# Patient Record
Sex: Female | Born: 1950 | Race: White | Hispanic: No | Marital: Married | Smoking: Never smoker
Health system: Southern US, Community
[De-identification: ages and names within clinical notes are randomized; demographics above are authoritative.]

## PROBLEM LIST (undated history)

## (undated) HISTORY — PX: BREAST EXCISIONAL BIOPSY: SUR124

---

## 2016-01-10 DIAGNOSIS — M25519 Pain in unspecified shoulder: Secondary | ICD-10-CM | POA: Diagnosis not present

## 2016-01-10 DIAGNOSIS — E049 Nontoxic goiter, unspecified: Secondary | ICD-10-CM | POA: Diagnosis not present

## 2016-01-10 DIAGNOSIS — E7800 Pure hypercholesterolemia, unspecified: Secondary | ICD-10-CM | POA: Diagnosis not present

## 2016-01-10 DIAGNOSIS — F329 Major depressive disorder, single episode, unspecified: Secondary | ICD-10-CM | POA: Diagnosis not present

## 2016-01-10 DIAGNOSIS — Z79899 Other long term (current) drug therapy: Secondary | ICD-10-CM | POA: Diagnosis not present

## 2016-01-15 ENCOUNTER — Ambulatory Visit: Payer: PPO | Attending: Family Medicine | Admitting: Physical Therapy

## 2016-01-15 DIAGNOSIS — M25511 Pain in right shoulder: Secondary | ICD-10-CM | POA: Insufficient documentation

## 2016-01-15 DIAGNOSIS — M25512 Pain in left shoulder: Secondary | ICD-10-CM | POA: Diagnosis not present

## 2016-01-15 DIAGNOSIS — M6281 Muscle weakness (generalized): Secondary | ICD-10-CM | POA: Diagnosis not present

## 2016-01-15 DIAGNOSIS — M2560 Stiffness of unspecified joint, not elsewhere classified: Secondary | ICD-10-CM | POA: Diagnosis not present
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
## 2016-01-15 NOTE — Patient Instructions (Signed)
Lumbar towel roll when sitting.   Smaller towel roll inside pillow case.     Keep doing chin tucks, shoulder blade squeezes.     [facility] [address], [address] [place], [place] [address] Phone # [phone] Fax [phone]

## 2016-01-17 ENCOUNTER — Ambulatory Visit: Payer: PPO | Admitting: Physical Therapy

## 2016-01-17 DIAGNOSIS — M2560 Stiffness of unspecified joint, not elsewhere classified: Secondary | ICD-10-CM

## 2016-01-17 DIAGNOSIS — M25512 Pain in left shoulder: Principal | ICD-10-CM

## 2016-01-17 DIAGNOSIS — M25511 Pain in right shoulder: Secondary | ICD-10-CM

## 2016-01-17 DIAGNOSIS — M6281 Muscle weakness (generalized): Secondary | ICD-10-CM

## 2016-01-17 NOTE — Therapy (Addendum)
[facility] [address], [address] [place], [place], [address] Phone: [phone]   Fax:  [phone]  Physical Therapy Treatment  Patient Details  Name: Priscilla Wilson MRN: [id number] Date of Birth: [birth year] Referring Provider: Sigmund HazelLisa Miller MD  Encounter Date: 01/17/2016      PT End of Session - 01/17/16 1006    Visit Number 2   Number of Visits 10   Date for PT Re-Evaluation 03/11/16   Authorization Type Medicare G codes; KX at 15   PT Start Time 0845   PT Stop Time 0945   PT Time Calculation (min) 60 min   Activity Tolerance Patient tolerated treatment well      No past medical history on file.  No past surgical history on file.  There were no vitals filed for this visit.  Visit Diagnosis:  Pain of both shoulder joints  Joint stiffness  Muscle weakness      Subjective Assessment - 01/17/16 0849    Subjective Sore and achy after the evaluation session.  AM stiffness.  Used the towel in the pillow case and that helped.  Less awakening, less restless.     Currently in Pain? Yes   Pain Score 2    Pain Location Shoulder   Pain Orientation Right;Left   Pain Type Chronic pain   Pain Onset More than a month ago                         [facility] - 01/17/16 0001    Shoulder Exercises: Supine   Other Supine Exercises supine scapular series 5 reps:  flex with narrow and wide grip, HABD, diagonals and ER small range   Shoulder Exercises: Standing   Extension Strengthening;Both;10 reps;Theraband   Theraband Level (Shoulder Extension) Level 1 (Yellow)   Row Strengthening;Both;10 reps;Theraband   Theraband Level (Shoulder Row) Level 1 (Yellow)   Modalities   Modalities Electrical Stimulation;Moist Heat   Moist Heat Therapy   Number Minutes Moist Heat 15 Minutes   Moist Heat Location Shoulder;Cervical  B   Electrical Stimulation   Electrical Stimulation Location B  shoulders   Electrical Stimulation Action Pre-mod   Electrical Stimulation Parameters 8 ma   Electrical Stimulation Goals Pain   Manual Therapy   Manual Therapy Soft tissue mobilization;Scapular mobilization;Manual Traction;Muscle Energy Technique   Soft tissue mobilization upper traps, levator scap, cervical multifidi B   Scapular Mobilization Bilateral med/lat glides   Manual Traction 3x 30 sec   Muscle Energy Technique upper trap R/L 3 x 5 sec       Dry needling to B upper traps, levators and B cervical multifidi with muscle twitch produced and improved muscle length following.   Consent given. Handout on precautions and after care provided.           PT Education - 01/17/16 1005    Education provided Yes   Education Details dry needling after care; standing yellow band rows, extensions   Person(s) Educated Patient   Methods Explanation;Demonstration   Comprehension Verbalized understanding;Returned demonstration          PT Short Term Goals - 01/17/16 1324    PT SHORT TERM GOAL #1   Title The patient will have a good understanding of postural correction for improved sleep and sitting tolerance   02/14/16   Time 4   Period Weeks   Status On-going   PT SHORT TERM GOAL #2   Title The  patient will report improved sleep by 25%   Time 4   Period Weeks   Status On-going   PT SHORT TERM GOAL #3   Title The patient will have improved cervical sidebending to 40 degrees bilaterally and rotation to 50 degrees needed for driving   Time 4   Period Weeks   Status On-going   PT SHORT TERM GOAL #4   Title The patient will have improved shoulder flexion to 170 degrees needed for reaching overhead shelves   Time 4   Period Weeks   Status On-going           PT Long Term Goals - 01/17/16 1325    PT LONG TERM GOAL #1   Title The patient will be independent in safe,self progression of HEP and knowledge of  community resources available for group ex   03/11/16   Time 8   Period  Weeks   Status On-going   PT LONG TERM GOAL #2   Title The patient will report a 50% improvement in sleep   Time 8   Period Weeks   Status On-going   PT LONG TERM GOAL #3   Title Glenohumeral and scapular muscle strength improved to grossly 4/5 needed for taking care of grandchildren and lifting household items    Time 8   Period Weeks   Status On-going   PT LONG TERM GOAL #4   Title Left grip strength improved to 40# of force needed for lifting/carrying items   Time 8   Period Weeks   Status On-going   PT LONG TERM GOAL #5   Title FOTO functional outcome score improved from 43% limitation to 34% indicating improved function with less pain   Time 8   Period Weeks   Status On-going               Plan - 01/17/16 1318    Clinical Impression Statement The patient reports right arm clicking sensation with ROM > 90 degrees, mod discomfort with supine yellow band but better with standing rows and extensions.  Tender points in cervical musculature and upper traps.  Good pain relief with modalities.  Therapist closely monitoring response and monitoring as needed.     PT Next Visit Plan assess response to DN #1;  seated thoracic extension;  ionto if cert signed;  isometrics for shoulder;  estim/heat        Problem List There are no active problems to display for this patient.   Vivien Presto 01/17/2016, 1:27 PM  [facility] [address] [place], [place], [address] Phone: [phone]   Fax:  [phone]  Name: Priscilla Wilson MRN: [id number] Date of Birth: 1950-11-15    Lavinia Sharps, PT 01/17/2016 1:28 PM Phone: [phone] Fax: [phone]

## 2016-01-17 NOTE — Patient Instructions (Addendum)
   [facility] [address], [address] [place], [place] [address] Phone # [phone] Fax [phone]   Trigger Point Dry Needling  . What is Trigger Point Dry Needling (DN)? o DN is a physical therapy technique used to treat muscle pain and dysfunction. Specifically, DN helps deactivate muscle trigger points (muscle knots).  o A thin filiform needle is used to penetrate the skin and stimulate the underlying trigger point. The goal is for a local twitch response (LTR) to occur and for the trigger point to relax. No medication of any kind is injected during the procedure.   . What Does Trigger Point Dry Needling Feel Like?  o The procedure feels different for each individual patient. Some patients report that they do not actually feel the needle enter the skin and overall the process is not painful. Very mild bleeding may occur. However, many patients feel a deep cramping in the muscle in which the needle was inserted. This is the local twitch response.   Marland Kitchen. How Will I feel after the treatment? o Soreness is normal, and the onset of soreness may not occur for a few hours. Typically this soreness does not last longer than two days.  o Bruising is uncommon, however; ice can be used to decrease any possible bruising.  o In rare cases feeling tired or nauseous after the treatment is normal. In addition, your symptoms may get worse before they get better, this period will typically not last longer than 24 hours.   . What Can I do After My Treatment? o Increase your hydration by drinking more water for the next 24 hours. o You may place ice or heat on the areas treated that have become sore, however, do not use heat on inflamed or bruised areas. Heat often brings more relief post needling. o You can continue your regular activities, but vigorous activity is not recommended initially after the treatment for 24 hours. o DN is best combined with other physical therapy such as  strengthening, stretching, and other therapies.

## 2016-01-21 ENCOUNTER — Ambulatory Visit: Payer: PPO | Admitting: Physical Therapy

## 2016-01-21 DIAGNOSIS — M25511 Pain in right shoulder: Secondary | ICD-10-CM | POA: Diagnosis not present

## 2016-01-21 DIAGNOSIS — M2560 Stiffness of unspecified joint, not elsewhere classified: Secondary | ICD-10-CM

## 2016-01-21 DIAGNOSIS — M6281 Muscle weakness (generalized): Secondary | ICD-10-CM

## 2016-01-21 DIAGNOSIS — M25512 Pain in left shoulder: Principal | ICD-10-CM

## 2016-01-21 NOTE — Therapy (Signed)
[facility] [address] [place], [place], [address] Phone: [phone]   Fax:  [phone]  Physical Therapy Treatment  Patient Details  Name: Priscilla Wilson MRN: [id number] Date of Birth: [birth year] Referring Provider: Sigmund HazelLisa Miller MD  Encounter Date: 01/21/2016      PT End of Session - 01/21/16 1553    Visit Number 3   Number of Visits 10   Date for PT Re-Evaluation 03/11/16   Authorization Type Medicare G codes; KX at 15   PT Start Time 1015   PT Stop Time 1115   PT Time Calculation (min) 60 min   Activity Tolerance Patient tolerated treatment well      No past medical history on file.  No past surgical history on file.  There were no vitals filed for this visit.  Visit Diagnosis:  Pain of both shoulder joints  Joint stiffness  Muscle weakness      Subjective Assessment - 01/21/16 1012    Subjective I felt so good after last session.  I felt a world of difference!  Patient demonstrates her new ability to reach behind her back/shoulder blade region.  She attributes relief to dry needling.  Did have difficulty sleeping last night.     Currently in Pain? Yes   Pain Score 3    Pain Location Shoulder   Pain Orientation Right                         [facility] Adult PT Treatment/Exercise - 01/21/16 0001    Neck Exercises: Seated   Other Seated Exercise Seated thoracic    Shoulder Exercises: Isometric Strengthening   Flexion 5X5"   Extension 5X5"   External Rotation 5X5"   Internal Rotation 5X5"   ABduction 5X5"   Moist Heat Therapy   Number Minutes Moist Heat 15 Minutes   Moist Heat Location Shoulder;Cervical  B   Electrical Stimulation   Electrical Stimulation Location B shoulders   Electrical Stimulation Action Pre-mod   Electrical Stimulation Parameters 8-10 ma  supine   Electrical Stimulation Goals Pain   Manual Therapy   Soft tissue mobilization upper traps, levator scap,  cervical multifidi B   Scapular Mobilization Bilateral med/lat glides          Trigger Point Dry Needling - 01/21/16 1552    Consent Given? Yes   Muscles Treated Upper Body Upper trapezius;Levator scapulae;Rhomboids;Subscapularis   Upper Trapezius Response Twitch reponse elicited;Palpable increased muscle length   Levator Scapulae Response Twitch response elicited;Palpable increased muscle length   Rhomboids Response Palpable increased muscle length   Subscapularis Response Palpable increased muscle length     Performed bilaterally         PT Education - 01/21/16 1552    Education provided Yes   Education Details shoulder isometrics   Person(s) Educated Patient   Methods Explanation;Demonstration;Handout   Comprehension Verbalized understanding;Returned demonstration          PT Short Term Goals - 01/21/16 1605    PT SHORT TERM GOAL #1   Title The patient will have a good understanding of postural correction for improved sleep and sitting tolerance   02/14/16   Time 4   Period Weeks   Status On-going   PT SHORT TERM GOAL #2   Title The patient will report improved sleep by 25%   Time 4   Period Weeks   Status On-going   PT SHORT TERM GOAL #3  Title The patient will have improved cervical sidebending to 40 degrees bilaterally and rotation to 50 degrees needed for driving   Period Weeks   Status On-going   PT SHORT TERM GOAL #4   Title The patient will have improved shoulder flexion to 170 degrees needed for reaching overhead shelves   Time 4   Period Weeks   Status On-going           PT Long Term Goals - 01/21/16 1605    PT LONG TERM GOAL #1   Title The patient will be independent in safe,self progression of HEP and knowledge of  community resources available for group ex   03/11/16   Time 8   Period Weeks   Status On-going   PT LONG TERM GOAL #2   Title The patient will report a 50% improvement in sleep   Time 8   Period Weeks   Status On-going   PT  LONG TERM GOAL #3   Title Glenohumeral and scapular muscle strength improved to grossly 4/5 needed for taking care of grandchildren and lifting household items    Time 8   Period Weeks   Status On-going   PT LONG TERM GOAL #4   Title Left grip strength improved to 40# of force needed for lifting/carrying items   Time 8   Period Weeks   Status On-going   PT LONG TERM GOAL #5   Title FOTO functional outcome score improved from 43% limitation to 34% indicating improved function with less pain   Time 8   Period Weeks   Status On-going               Plan - 01/21/16 1554    Clinical Impression Statement The patient reports good pain relief and improved ROM following last treatment session.  She has multiple tender points in interscapular musculature but with improved scapular mobility with manual therapy following dry needling.  Patient with more erect posture with decreased shoulder compensatory shrug today.  Therapist closely monitoring response  to all treatment interventions.     PT Next Visit Plan assess response to DN #2 and continue to cervical, shoulder and interscap muscles;  manual therapy; ionto if cert signed; e-stim/heat; prone scapular ex's        Problem List There are no active problems to display for this patient.  Lavinia Sharps, PT 01/21/2016 4:07 PM Phone: [phone] Fax: [phone] Vivien Presto 01/21/2016, 4:07 PM  [facility] [address] [place], [place], [address] Phone: [phone]   Fax:  [phone]  Name: Priscilla Wilson MRN: [id number] Date of Birth: Jul 11, 1951

## 2016-01-21 NOTE — Patient Instructions (Signed)
Strengthening: Isometric Flexion  Using wall for resistance, press right fist into ball using light pressure. Hold __5__ seconds. Repeat _5___ times per set. Do ____1 sets per session. Do ___2-3_ sessions per day.  SHOULDER: Abduction (Isometric)  Use wall as resistance. Press arm against pillow. Keep elbow straight. Hold _5__ seconds. _5__ reps per set, _1__ sets per day, _7__ days per week  Extension (Isometric)  Place left bent elbow and back of arm against wall. Press elbow against wall. Hold ___5_ seconds. Repeat __5__ times. Do __2-3__ sessions per day.  Internal Rotation (Isometric)  Place palm of right fist against door frame, with elbow bent. Press fist against door frame. Hold __5__ seconds. Repeat ___5_ times. Do __2-3__ sessions per day.  External Rotation (Isometric)  Place back of left fist against door frame, with elbow bent. Press fist against door frame. Hold __5__ seconds. Repeat ___5_ times. Do _2-3___ sessions per day.  Copyright  VHI. All rights reserved.  [facility] [address], [address] [place], [place] [address] Phone # [phone] Fax [phone]

## 2016-01-24 ENCOUNTER — Encounter: Payer: Self-pay | Admitting: Physical Therapy

## 2016-01-24 ENCOUNTER — Ambulatory Visit: Payer: PPO | Admitting: Physical Therapy

## 2016-01-24 DIAGNOSIS — M6281 Muscle weakness (generalized): Secondary | ICD-10-CM

## 2016-01-24 DIAGNOSIS — M25511 Pain in right shoulder: Secondary | ICD-10-CM

## 2016-01-24 DIAGNOSIS — M25512 Pain in left shoulder: Principal | ICD-10-CM

## 2016-01-24 DIAGNOSIS — M2560 Stiffness of unspecified joint, not elsewhere classified: Secondary | ICD-10-CM

## 2016-01-24 NOTE — Therapy (Addendum)
[facility] [address] [place], [place], [address] Phone: [phone]   Fax:  [phone]  Physical Therapy Treatment/Discharge Summary  Patient Details  Name: Priscilla Wilson MRN: [id number] Date of Birth: Jan 14, 1951 Referring Provider: Kathyrn Lass MD  Encounter Date: 01/24/2016      PT End of Session - 01/24/16 1457    Visit Number 4   Number of Visits 10   Date for PT Re-Evaluation 03/11/16   Authorization Type Medicare G codes; KX at 56   PT Start Time 1444   PT Stop Time 1543   PT Time Calculation (min) 59 min   Activity Tolerance Patient tolerated treatment well   Behavior During Therapy [facility] for tasks assessed/performed      History reviewed. No pertinent past medical history.  History reviewed. No pertinent past surgical history.  There were no vitals filed for this visit.  Visit Diagnosis:  Pain of both shoulder joints  Joint stiffness  Muscle weakness      Subjective Assessment - 01/24/16 1455    Subjective Pt confirmed again that Dry Needeling helped a lot. She reports sleeping in the night is better.     Pertinent History Moved here from [place] in November   Limitations Sitting   How long can you sit comfortably? 1 hour   Diagnostic tests x-ray cervical normal   Patient Stated Goals sleep better, get rid of tightness   Currently in Pain? No/denies                         [facility] Adult PT Treatment/Exercise - 01/24/16 0001    Neck Exercises: Seated   Other Seated Exercise Seated thoracic with ball behind back    Shoulder Exercises: Supine   Other Supine Exercises Foam Roll x 3 min with overhead flexion x 10   Shoulder Exercises: Isometric Strengthening   Flexion 5X5"   Extension 5X5"   External Rotation 5X5"   Internal Rotation 5X5"   ABduction 5X5"   Modalities   Modalities Electrical Stimulation;Moist Heat   Moist Heat Therapy   Number Minutes Moist Heat 15  Minutes   Moist Heat Location Shoulder;Cervical  B   Electrical Stimulation   Electrical Stimulation Location B shoulders   Electrical Stimulation Action Pre mod   Electrical Stimulation Parameters to pt's tolerance   Electrical Stimulation Goals Pain   Manual Therapy   Manual Therapy Soft tissue mobilization;Scapular mobilization;Manual Traction;Muscle Energy Technique   Soft tissue mobilization upper traps, levator scap, cervical multifidi B   Scapular Mobilization Bilateral med/lat glides                  PT Short Term Goals - 01/21/16 1605    PT SHORT TERM GOAL #1   Title The patient will have a good understanding of postural correction for improved sleep and sitting tolerance   02/14/16   Time 4   Period Weeks   Status On-going   PT SHORT TERM GOAL #2   Title The patient will report improved sleep by 25%   Time 4   Period Weeks   Status On-going   PT SHORT TERM GOAL #3   Title The patient will have improved cervical sidebending to 40 degrees bilaterally and rotation to 50 degrees needed for driving   Period Weeks   Status On-going   PT SHORT TERM GOAL #4   Title The patient will have improved shoulder flexion to 170 degrees needed  for reaching overhead shelves   Time 4   Period Weeks   Status On-going           PT Long Term Goals - 01/21/16 1605    PT LONG TERM GOAL #1   Title The patient will be independent in safe,self progression of HEP and knowledge of  community resources available for group ex   03/11/16   Time 8   Period Weeks   Status On-going   PT LONG TERM GOAL #2   Title The patient will report a 50% improvement in sleep   Time 8   Period Weeks   Status On-going   PT LONG TERM GOAL #3   Title Glenohumeral and scapular muscle strength improved to grossly 4/5 needed for taking care of grandchildren and lifting household items    Time 8   Period Weeks   Status On-going   PT LONG TERM GOAL #4   Title Left grip strength improved to 40# of  force needed for lifting/carrying items   Time 8   Period Weeks   Status On-going   PT LONG TERM GOAL #5   Title FOTO functional outcome score improved from 43% limitation to 34% indicating improved function with less pain   Time 8   Period Weeks   Status On-going               Plan - 01/24/16 1458    Clinical Impression Statement Pt with improved ROM in bil shoulders. Pt with TP in interscapular musculatur. Pt will continue to benefit from skilled PT to improve ROM, strength endurance.    Pt will benefit from skilled therapeutic intervention in order to improve on the following deficits Decreased range of motion;Decreased strength;Hypomobility;Increased muscle spasms;Impaired UE functional use;Pain;Postural dysfunction   Rehab Potential Good   Clinical Impairments Affecting Rehab Potential osteopenia although last bone density improved; hx of depression   PT Frequency 2x / week   PT Treatment/Interventions ADLs/Self Care Home Management;Cryotherapy;Electrical Stimulation;Ultrasound;Moist Heat;Traction;Iontophoresis '4mg'$ /ml Dexamethasone;Therapeutic activities;Therapeutic exercise;Neuromuscular re-education;Manual techniques;Taping;Dry needling   PT Next Visit Plan Continue with isometrics bil, shoulders, stretching, manaul therapy, see if [place] is signed   Consulted and Agree with Plan of Care Patient        PHYSICAL THERAPY DISCHARGE SUMMARY  Visits from Start of Care: 4  Current functional level related to goals / functional outcomes: The patient called to cancel all her remaining visits, stating she "had business to handle in [place]."  Her chart has been inactive for 6 weeks, will therefore discharge from PT at this time.     Remaining deficits: See above.  No progress toward goals yet.     Education / Equipment: Initial HEP Plan:                                                    Patient goals were not met. Patient is being discharged due to the patient's request.   ?????      G code:  Carrying, moving objects Goal CJ, Current/Discharge CK    Problem List There are no active problems to display for this patient.  Ruben Im, PT 03/10/2016 4:44 PM Phone: [phone] Fax: [phone]  NAUMANN-HOUEGNIFIO,Priscilla Wilson PTA 01/24/2016, 3:15 PM  [facility] [address] [place], [place], [address] Phone: [phone]   Fax:  [phone]  Name: Priscilla Wilson MRN: [id number] Date of Birth: 23-Feb-1951

## 2016-01-29 ENCOUNTER — Encounter: Payer: PPO | Admitting: Physical Therapy

## 2016-01-31 ENCOUNTER — Encounter: Payer: PPO | Admitting: Physical Therapy

## 2016-03-10 ENCOUNTER — Encounter: Payer: Self-pay | Admitting: Physical Therapy

## 2016-03-12 ENCOUNTER — Encounter: Payer: Self-pay | Admitting: Physical Therapy

## 2016-08-07 DIAGNOSIS — Z23 Encounter for immunization: Secondary | ICD-10-CM | POA: Diagnosis not present

## 2016-08-07 DIAGNOSIS — N950 Postmenopausal bleeding: Secondary | ICD-10-CM | POA: Diagnosis not present

## 2016-08-07 DIAGNOSIS — E7800 Pure hypercholesterolemia, unspecified: Secondary | ICD-10-CM | POA: Diagnosis not present

## 2016-08-07 DIAGNOSIS — R1011 Right upper quadrant pain: Secondary | ICD-10-CM | POA: Diagnosis not present

## 2016-08-08 ENCOUNTER — Other Ambulatory Visit: Payer: Self-pay | Admitting: Family Medicine

## 2016-08-08 DIAGNOSIS — R1011 Right upper quadrant pain: Secondary | ICD-10-CM

## 2016-08-26 ENCOUNTER — Other Ambulatory Visit: Payer: PPO

## 2016-09-04 ENCOUNTER — Ambulatory Visit
Admission: RE | Admit: 2016-09-04 | Discharge: 2016-09-04 | Disposition: A | Discharge status: Home / Self Care | Payer: PPO | Source: Ambulatory Visit | Attending: Family Medicine | Admitting: Family Medicine

## 2016-09-04 ENCOUNTER — Other Ambulatory Visit: Payer: Self-pay | Admitting: Family Medicine

## 2016-09-04 DIAGNOSIS — R1011 Right upper quadrant pain: Secondary | ICD-10-CM | POA: Diagnosis not present

## 2016-09-04 DIAGNOSIS — N950 Postmenopausal bleeding: Secondary | ICD-10-CM

## 2016-09-29 DIAGNOSIS — R05 Cough: Secondary | ICD-10-CM | POA: Diagnosis not present

## 2017-02-16 DIAGNOSIS — E7800 Pure hypercholesterolemia, unspecified: Secondary | ICD-10-CM | POA: Diagnosis not present

## 2017-02-16 DIAGNOSIS — Z79899 Other long term (current) drug therapy: Secondary | ICD-10-CM | POA: Diagnosis not present

## 2017-02-16 DIAGNOSIS — M7989 Other specified soft tissue disorders: Secondary | ICD-10-CM | POA: Diagnosis not present

## 2017-02-16 DIAGNOSIS — Z1159 Encounter for screening for other viral diseases: Secondary | ICD-10-CM | POA: Diagnosis not present

## 2017-02-16 DIAGNOSIS — F329 Major depressive disorder, single episode, unspecified: Secondary | ICD-10-CM | POA: Diagnosis not present

## 2017-03-24 DIAGNOSIS — M2550 Pain in unspecified joint: Secondary | ICD-10-CM | POA: Diagnosis not present

## 2017-03-24 DIAGNOSIS — E663 Overweight: Secondary | ICD-10-CM | POA: Diagnosis not present

## 2017-03-24 DIAGNOSIS — Z1589 Genetic susceptibility to other disease: Secondary | ICD-10-CM | POA: Diagnosis not present

## 2017-03-24 DIAGNOSIS — M150 Primary generalized (osteo)arthritis: Secondary | ICD-10-CM | POA: Diagnosis not present

## 2017-03-24 DIAGNOSIS — R768 Other specified abnormal immunological findings in serum: Secondary | ICD-10-CM | POA: Diagnosis not present

## 2017-03-24 DIAGNOSIS — M064 Inflammatory polyarthropathy: Secondary | ICD-10-CM | POA: Diagnosis not present

## 2017-03-24 DIAGNOSIS — Z6827 Body mass index (BMI) 27.0-27.9, adult: Secondary | ICD-10-CM | POA: Diagnosis not present

## 2017-04-02 IMAGING — US US PELVIS COMPLETE
1 series · 14 of 25 positions shown · non-contrast
Comparison: None

CLINICAL DATA: Postmenopausal bleeding on off for 4 months. No
pelvic pain.

EXAM:
TRANSABDOMINAL AND TRANSVAGINAL ULTRASOUND OF PELVIS
TECHNIQUE: Both transabdominal and transvaginal ultrasound examinations of the
pelvis were performed. Transabdominal technique was performed for
global imaging of the pelvis including uterus, ovaries, adnexal
regions, and pelvic cul-de-sac. It was necessary to proceed with
endovaginal exam following the transabdominal exam to visualize the
endometrium and ovaries to better advantage.

[Series 1: us pelvis complete · 0.30mm/px · 14 of 51 slices shown]
[im 1/51]
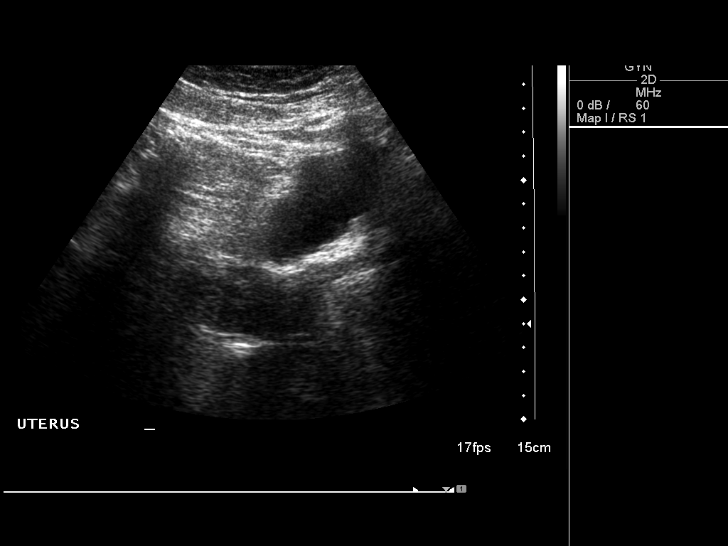
[im 5/51]
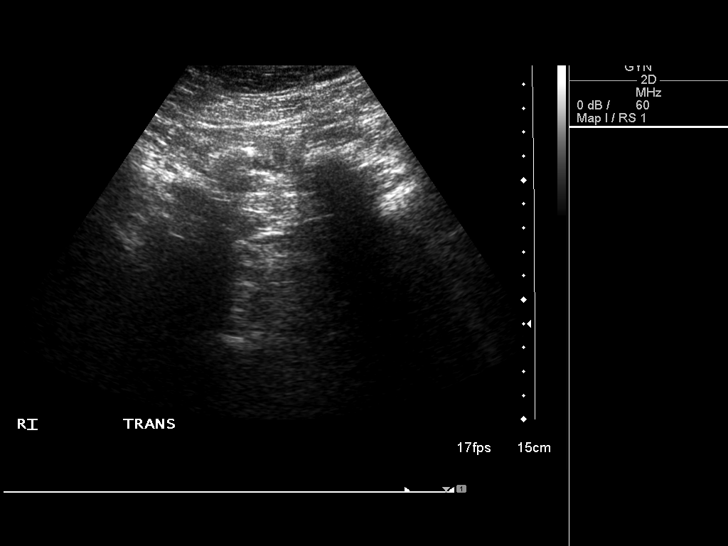
[im 9/51]
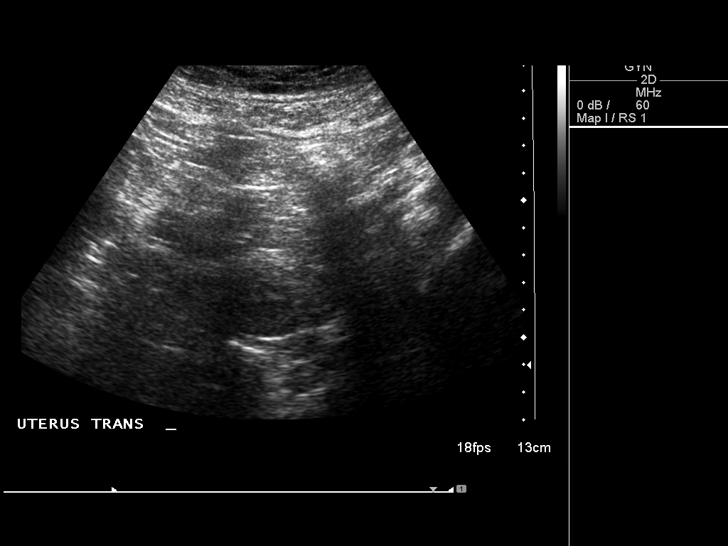
[im 13/51]
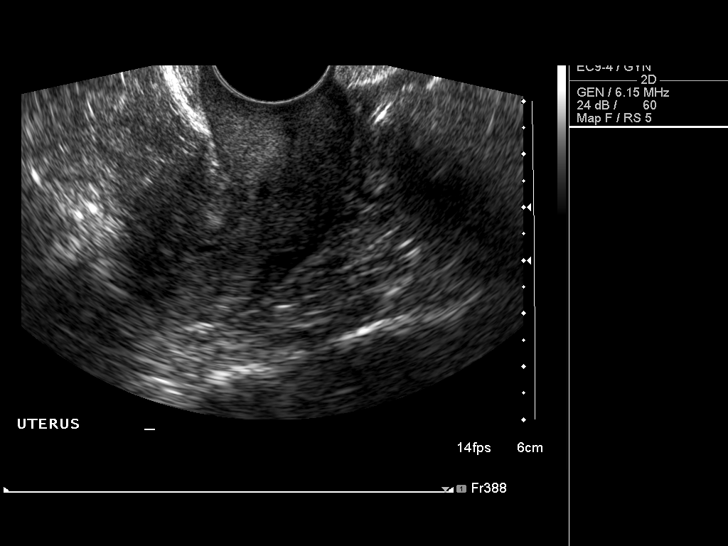
[im 17/51]
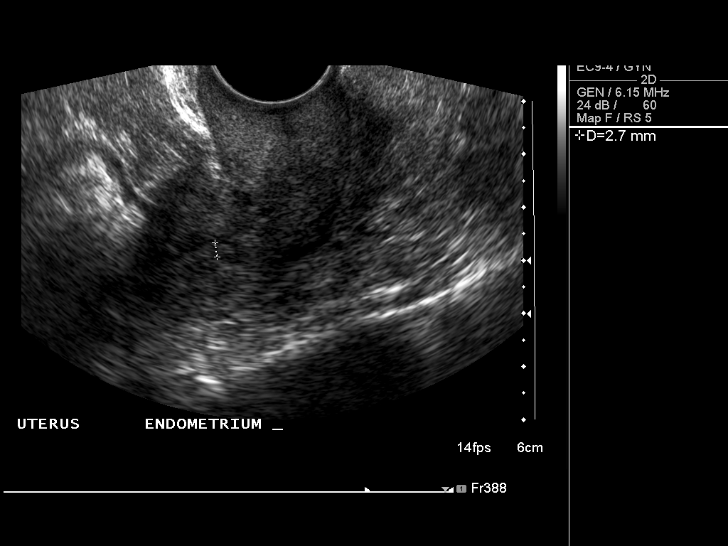
[im 19/51]
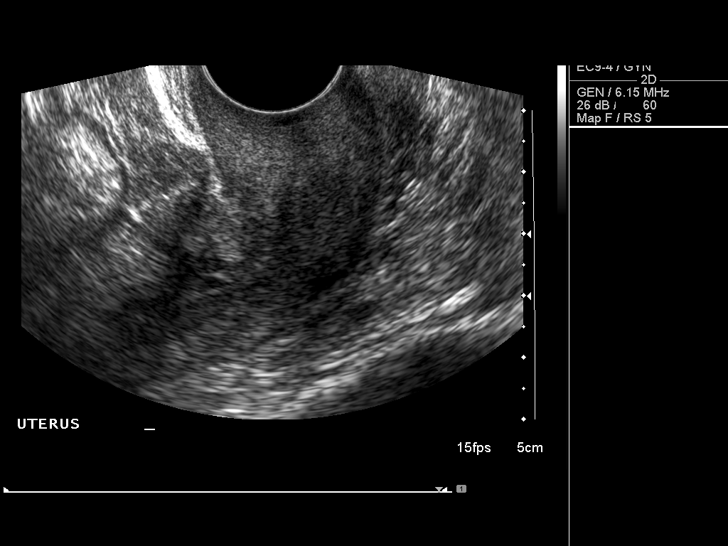
[im 23/51]
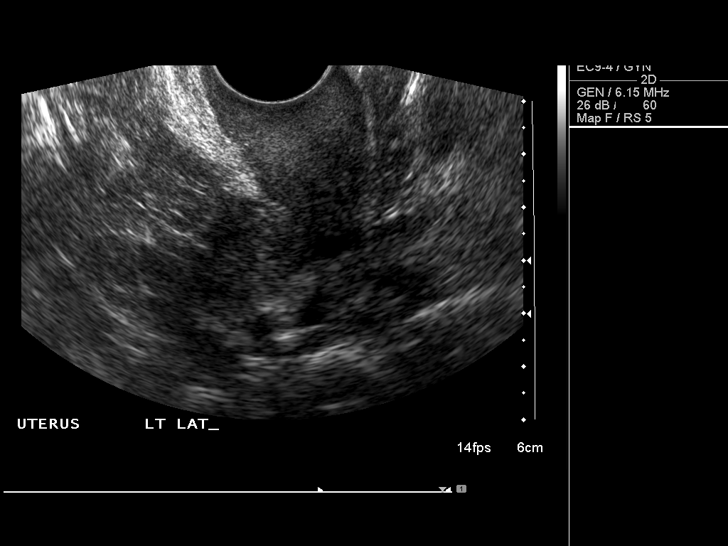
[im 28/51]
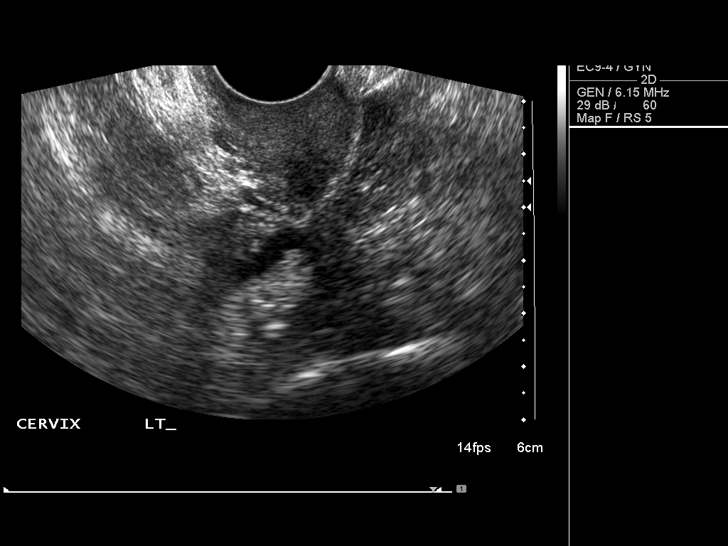
[im 32/51]
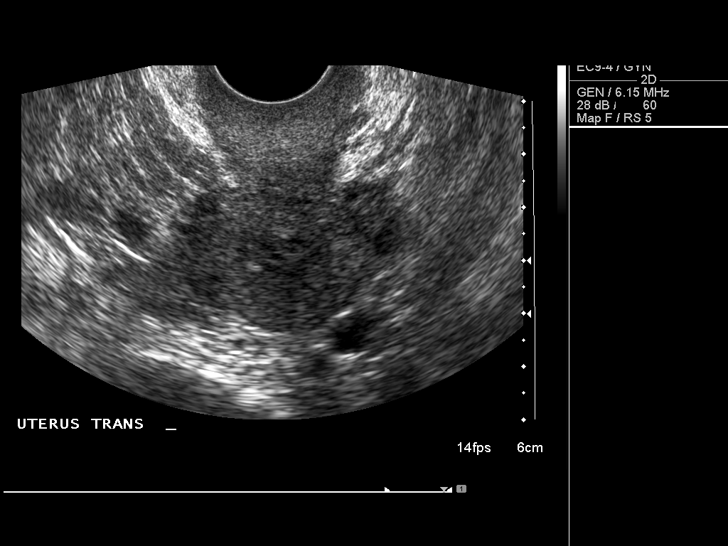
[im 34/51]
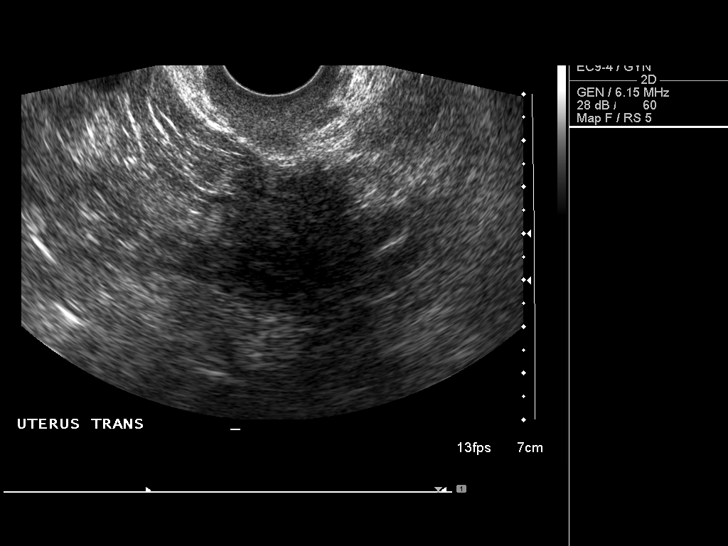
[im 38/51]
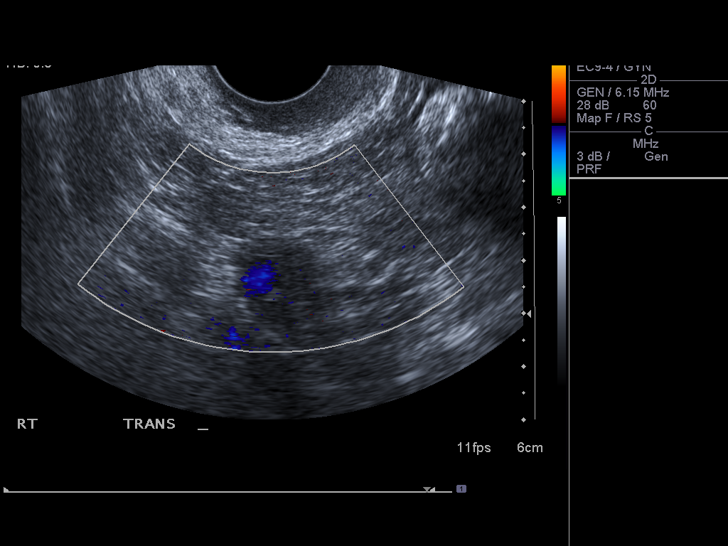
[im 42/51]
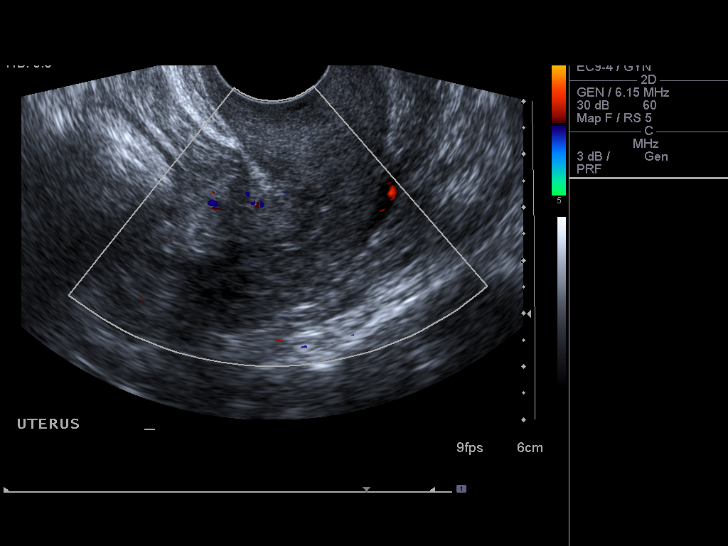
[im 46/51]
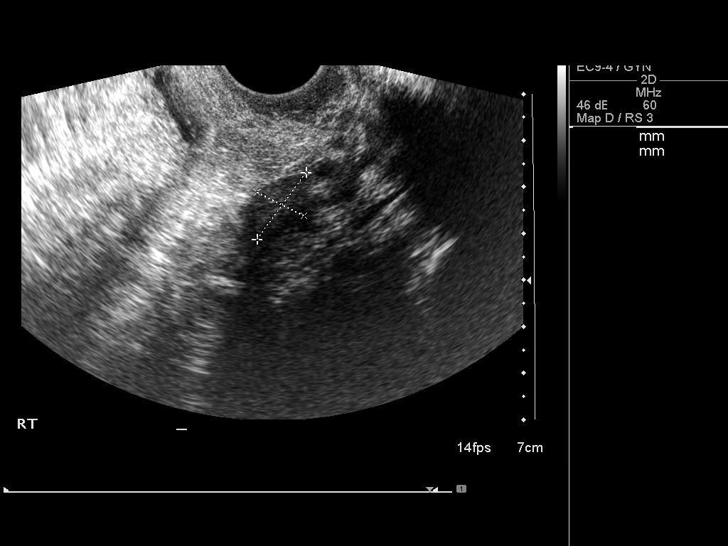
[im 51/51]
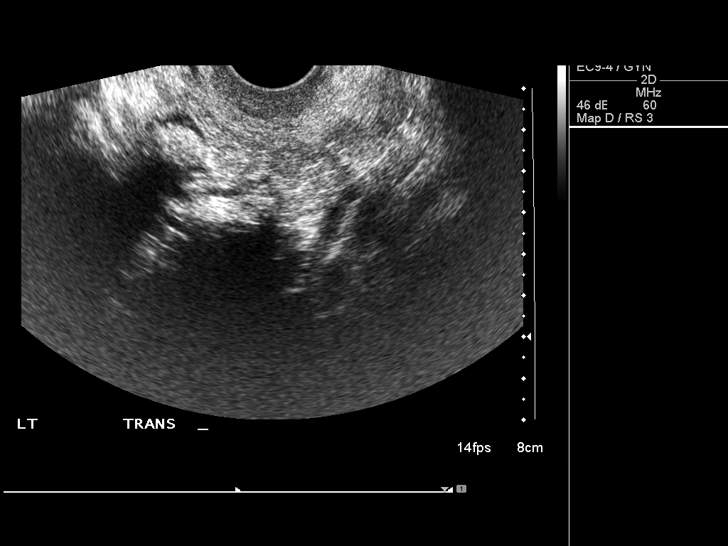

[14 of 25 positions shown; findings below may reference images not displayed]

FINDINGS: Uterus

Measurements: 6.2 x 3.2 x 4.0 cm. No fibroids or other mass
visualized.

Endometrium

Thickness: 2.7 mm. Sliver of fluid in the lower endometrial canal.
No endometrial masses.

Right ovary

Measurements: 18 x 11 x 11 mm. Not well visualized. No visualized
ovarian abnormality. No right adnexal masses.

Left ovary

Not visualized.  No left adnexal masses.

Other findings

No abnormal free fluid.
IMPRESSION: 1. No acute findings.
2. No endometrial thickening or mass. No findings to account for
postmenopausal bleeding.
3. Left ovary not visualized. No adnexal masses. No pelvic free
fluid.

## 2017-04-15 DIAGNOSIS — Z1589 Genetic susceptibility to other disease: Secondary | ICD-10-CM | POA: Diagnosis not present

## 2017-04-15 DIAGNOSIS — R768 Other specified abnormal immunological findings in serum: Secondary | ICD-10-CM | POA: Diagnosis not present

## 2017-04-15 DIAGNOSIS — Z6827 Body mass index (BMI) 27.0-27.9, adult: Secondary | ICD-10-CM | POA: Diagnosis not present

## 2017-04-15 DIAGNOSIS — M064 Inflammatory polyarthropathy: Secondary | ICD-10-CM | POA: Diagnosis not present

## 2017-04-15 DIAGNOSIS — M150 Primary generalized (osteo)arthritis: Secondary | ICD-10-CM | POA: Diagnosis not present

## 2017-04-15 DIAGNOSIS — E663 Overweight: Secondary | ICD-10-CM | POA: Diagnosis not present

## 2017-04-15 DIAGNOSIS — M2550 Pain in unspecified joint: Secondary | ICD-10-CM | POA: Diagnosis not present

## 2017-08-11 DIAGNOSIS — Z1589 Genetic susceptibility to other disease: Secondary | ICD-10-CM | POA: Diagnosis not present

## 2017-08-11 DIAGNOSIS — M064 Inflammatory polyarthropathy: Secondary | ICD-10-CM | POA: Diagnosis not present

## 2017-08-11 DIAGNOSIS — M150 Primary generalized (osteo)arthritis: Secondary | ICD-10-CM | POA: Diagnosis not present

## 2017-08-11 DIAGNOSIS — R21 Rash and other nonspecific skin eruption: Secondary | ICD-10-CM | POA: Diagnosis not present

## 2017-08-11 DIAGNOSIS — Z6827 Body mass index (BMI) 27.0-27.9, adult: Secondary | ICD-10-CM | POA: Diagnosis not present

## 2017-08-11 DIAGNOSIS — E663 Overweight: Secondary | ICD-10-CM | POA: Diagnosis not present

## 2017-08-11 DIAGNOSIS — M2550 Pain in unspecified joint: Secondary | ICD-10-CM | POA: Diagnosis not present

## 2017-08-11 DIAGNOSIS — R768 Other specified abnormal immunological findings in serum: Secondary | ICD-10-CM | POA: Diagnosis not present

## 2017-11-16 DIAGNOSIS — Z6827 Body mass index (BMI) 27.0-27.9, adult: Secondary | ICD-10-CM | POA: Diagnosis not present

## 2017-11-16 DIAGNOSIS — M150 Primary generalized (osteo)arthritis: Secondary | ICD-10-CM | POA: Diagnosis not present

## 2017-11-16 DIAGNOSIS — M064 Inflammatory polyarthropathy: Secondary | ICD-10-CM | POA: Diagnosis not present

## 2017-11-16 DIAGNOSIS — R768 Other specified abnormal immunological findings in serum: Secondary | ICD-10-CM | POA: Diagnosis not present

## 2017-11-16 DIAGNOSIS — E663 Overweight: Secondary | ICD-10-CM | POA: Diagnosis not present

## 2017-11-16 DIAGNOSIS — M2550 Pain in unspecified joint: Secondary | ICD-10-CM | POA: Diagnosis not present

## 2017-11-16 DIAGNOSIS — Z1589 Genetic susceptibility to other disease: Secondary | ICD-10-CM | POA: Diagnosis not present

## 2017-11-30 DIAGNOSIS — R05 Cough: Secondary | ICD-10-CM | POA: Diagnosis not present

## 2017-11-30 DIAGNOSIS — J0100 Acute maxillary sinusitis, unspecified: Secondary | ICD-10-CM | POA: Diagnosis not present

## 2017-11-30 DIAGNOSIS — J0110 Acute frontal sinusitis, unspecified: Secondary | ICD-10-CM | POA: Diagnosis not present

## 2017-12-21 DIAGNOSIS — R200 Anesthesia of skin: Secondary | ICD-10-CM | POA: Diagnosis not present

## 2017-12-21 DIAGNOSIS — M79642 Pain in left hand: Secondary | ICD-10-CM | POA: Diagnosis not present

## 2017-12-21 DIAGNOSIS — R52 Pain, unspecified: Secondary | ICD-10-CM | POA: Diagnosis not present

## 2017-12-21 DIAGNOSIS — M65331 Trigger finger, right middle finger: Secondary | ICD-10-CM | POA: Diagnosis not present

## 2017-12-21 DIAGNOSIS — M542 Cervicalgia: Secondary | ICD-10-CM | POA: Diagnosis not present

## 2017-12-22 DIAGNOSIS — G8929 Other chronic pain: Secondary | ICD-10-CM | POA: Diagnosis not present

## 2017-12-22 DIAGNOSIS — M5412 Radiculopathy, cervical region: Secondary | ICD-10-CM | POA: Diagnosis not present

## 2017-12-22 DIAGNOSIS — G5602 Carpal tunnel syndrome, left upper limb: Secondary | ICD-10-CM | POA: Diagnosis not present

## 2017-12-23 DIAGNOSIS — M65331 Trigger finger, right middle finger: Secondary | ICD-10-CM | POA: Diagnosis not present

## 2017-12-23 DIAGNOSIS — G5602 Carpal tunnel syndrome, left upper limb: Secondary | ICD-10-CM | POA: Diagnosis not present

## 2018-02-03 DIAGNOSIS — G5602 Carpal tunnel syndrome, left upper limb: Secondary | ICD-10-CM | POA: Diagnosis not present

## 2018-02-17 DIAGNOSIS — E7800 Pure hypercholesterolemia, unspecified: Secondary | ICD-10-CM | POA: Diagnosis not present

## 2018-02-17 DIAGNOSIS — F325 Major depressive disorder, single episode, in full remission: Secondary | ICD-10-CM | POA: Diagnosis not present

## 2018-02-17 DIAGNOSIS — M150 Primary generalized (osteo)arthritis: Secondary | ICD-10-CM | POA: Diagnosis not present

## 2018-02-17 DIAGNOSIS — E663 Overweight: Secondary | ICD-10-CM | POA: Diagnosis not present

## 2018-02-17 DIAGNOSIS — Z23 Encounter for immunization: Secondary | ICD-10-CM | POA: Diagnosis not present

## 2018-05-10 DIAGNOSIS — Z6828 Body mass index (BMI) 28.0-28.9, adult: Secondary | ICD-10-CM | POA: Diagnosis not present

## 2018-05-10 DIAGNOSIS — M150 Primary generalized (osteo)arthritis: Secondary | ICD-10-CM | POA: Diagnosis not present

## 2018-05-10 DIAGNOSIS — M064 Inflammatory polyarthropathy: Secondary | ICD-10-CM | POA: Diagnosis not present

## 2018-05-10 DIAGNOSIS — E663 Overweight: Secondary | ICD-10-CM | POA: Diagnosis not present

## 2018-05-10 DIAGNOSIS — M2550 Pain in unspecified joint: Secondary | ICD-10-CM | POA: Diagnosis not present

## 2018-05-10 DIAGNOSIS — R768 Other specified abnormal immunological findings in serum: Secondary | ICD-10-CM | POA: Diagnosis not present

## 2018-05-10 DIAGNOSIS — Z1589 Genetic susceptibility to other disease: Secondary | ICD-10-CM | POA: Diagnosis not present

## 2018-05-17 DIAGNOSIS — R768 Other specified abnormal immunological findings in serum: Secondary | ICD-10-CM | POA: Diagnosis not present

## 2018-05-17 DIAGNOSIS — M064 Inflammatory polyarthropathy: Secondary | ICD-10-CM | POA: Diagnosis not present

## 2018-06-02 DIAGNOSIS — M859 Disorder of bone density and structure, unspecified: Secondary | ICD-10-CM | POA: Diagnosis not present

## 2018-06-02 DIAGNOSIS — E7800 Pure hypercholesterolemia, unspecified: Secondary | ICD-10-CM | POA: Diagnosis not present

## 2018-06-02 DIAGNOSIS — F325 Major depressive disorder, single episode, in full remission: Secondary | ICD-10-CM | POA: Diagnosis not present

## 2018-06-02 DIAGNOSIS — Z6828 Body mass index (BMI) 28.0-28.9, adult: Secondary | ICD-10-CM | POA: Diagnosis not present

## 2018-06-02 DIAGNOSIS — Z0000 Encounter for general adult medical examination without abnormal findings: Secondary | ICD-10-CM | POA: Diagnosis not present

## 2018-06-07 ENCOUNTER — Other Ambulatory Visit: Payer: Self-pay | Admitting: Family Medicine

## 2018-06-07 DIAGNOSIS — Z1231 Encounter for screening mammogram for malignant neoplasm of breast: Secondary | ICD-10-CM

## 2018-06-23 DIAGNOSIS — R05 Cough: Secondary | ICD-10-CM | POA: Diagnosis not present

## 2018-06-29 ENCOUNTER — Ambulatory Visit
Admission: RE | Admit: 2018-06-29 | Discharge: 2018-06-29 | Disposition: A | Discharge status: Home / Self Care | Payer: PPO | Source: Ambulatory Visit | Attending: Family Medicine | Admitting: Family Medicine

## 2018-06-29 ENCOUNTER — Encounter: Payer: Self-pay | Admitting: Radiology

## 2018-06-29 DIAGNOSIS — Z1231 Encounter for screening mammogram for malignant neoplasm of breast: Secondary | ICD-10-CM

## 2018-07-01 DIAGNOSIS — J45909 Unspecified asthma, uncomplicated: Secondary | ICD-10-CM | POA: Diagnosis not present

## 2018-08-10 DIAGNOSIS — R768 Other specified abnormal immunological findings in serum: Secondary | ICD-10-CM | POA: Diagnosis not present

## 2018-08-10 DIAGNOSIS — M150 Primary generalized (osteo)arthritis: Secondary | ICD-10-CM | POA: Diagnosis not present

## 2018-08-10 DIAGNOSIS — Z6827 Body mass index (BMI) 27.0-27.9, adult: Secondary | ICD-10-CM | POA: Diagnosis not present

## 2018-08-10 DIAGNOSIS — Z1589 Genetic susceptibility to other disease: Secondary | ICD-10-CM | POA: Diagnosis not present

## 2018-08-10 DIAGNOSIS — M064 Inflammatory polyarthropathy: Secondary | ICD-10-CM | POA: Diagnosis not present

## 2018-08-10 DIAGNOSIS — E663 Overweight: Secondary | ICD-10-CM | POA: Diagnosis not present

## 2018-08-10 DIAGNOSIS — M2550 Pain in unspecified joint: Secondary | ICD-10-CM | POA: Diagnosis not present

## 2018-09-16 DIAGNOSIS — R05 Cough: Secondary | ICD-10-CM | POA: Diagnosis not present

## 2018-09-16 DIAGNOSIS — J0100 Acute maxillary sinusitis, unspecified: Secondary | ICD-10-CM | POA: Diagnosis not present

## 2018-09-16 DIAGNOSIS — J069 Acute upper respiratory infection, unspecified: Secondary | ICD-10-CM | POA: Diagnosis not present

## 2018-10-26 DIAGNOSIS — H5010 Unspecified exotropia: Secondary | ICD-10-CM | POA: Diagnosis not present

## 2018-11-11 DIAGNOSIS — M2550 Pain in unspecified joint: Secondary | ICD-10-CM | POA: Diagnosis not present

## 2018-11-11 DIAGNOSIS — M064 Inflammatory polyarthropathy: Secondary | ICD-10-CM | POA: Diagnosis not present

## 2018-11-11 DIAGNOSIS — E663 Overweight: Secondary | ICD-10-CM | POA: Diagnosis not present

## 2018-11-11 DIAGNOSIS — Z1589 Genetic susceptibility to other disease: Secondary | ICD-10-CM | POA: Diagnosis not present

## 2018-11-11 DIAGNOSIS — R768 Other specified abnormal immunological findings in serum: Secondary | ICD-10-CM | POA: Diagnosis not present

## 2018-11-11 DIAGNOSIS — M150 Primary generalized (osteo)arthritis: Secondary | ICD-10-CM | POA: Diagnosis not present

## 2018-11-11 DIAGNOSIS — Z6827 Body mass index (BMI) 27.0-27.9, adult: Secondary | ICD-10-CM | POA: Diagnosis not present

## 2018-12-03 DIAGNOSIS — J4520 Mild intermittent asthma, uncomplicated: Secondary | ICD-10-CM | POA: Diagnosis not present

## 2018-12-03 DIAGNOSIS — J0101 Acute recurrent maxillary sinusitis: Secondary | ICD-10-CM | POA: Diagnosis not present

## 2018-12-06 ENCOUNTER — Other Ambulatory Visit: Payer: Self-pay | Admitting: Family Medicine

## 2018-12-06 DIAGNOSIS — J0101 Acute recurrent maxillary sinusitis: Secondary | ICD-10-CM

## 2018-12-09 ENCOUNTER — Ambulatory Visit
Admission: RE | Admit: 2018-12-09 | Discharge: 2018-12-09 | Disposition: A | Discharge status: Home / Self Care | Payer: PPO | Source: Ambulatory Visit | Attending: Family Medicine | Admitting: Family Medicine

## 2018-12-09 DIAGNOSIS — J0101 Acute recurrent maxillary sinusitis: Secondary | ICD-10-CM

## 2018-12-09 DIAGNOSIS — J0120 Acute ethmoidal sinusitis, unspecified: Secondary | ICD-10-CM | POA: Diagnosis not present

## 2019-01-25 IMAGING — MG DIGITAL SCREENING BILATERAL MAMMOGRAM WITH TOMO AND CAD
8 series · 8 of 24 positions shown · non-contrast
Comparison: None.

ACR Breast Density Category a: The breast tissue is almost entirely
fatty.

CLINICAL DATA: Screening.

EXAM:
DIGITAL SCREENING BILATERAL MAMMOGRAM WITH TOMO AND CAD

[L CC synth-2D]
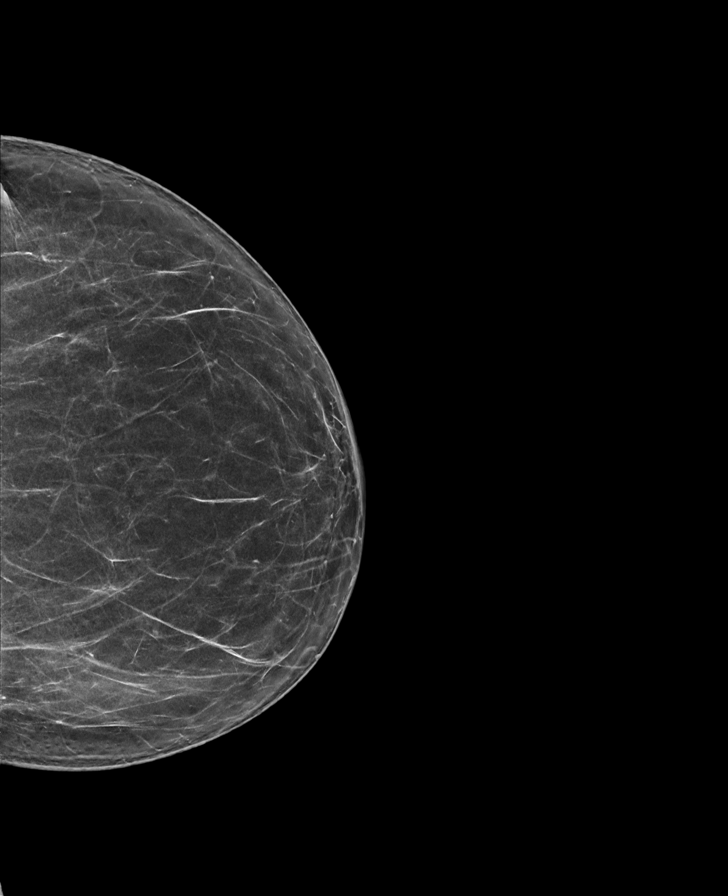

[R CC synth-2D]
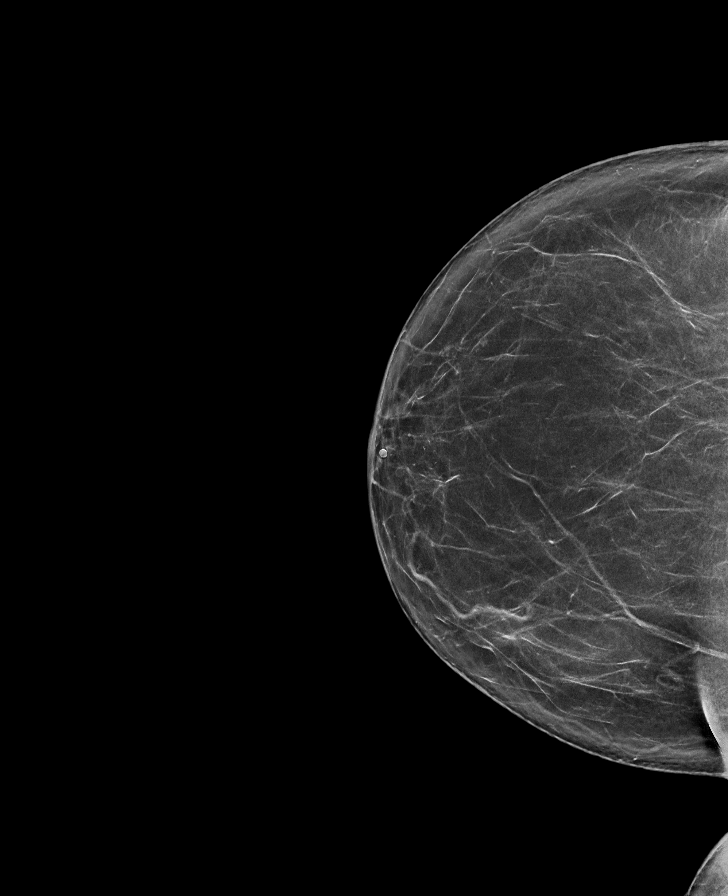

[L MLO synth-2D]
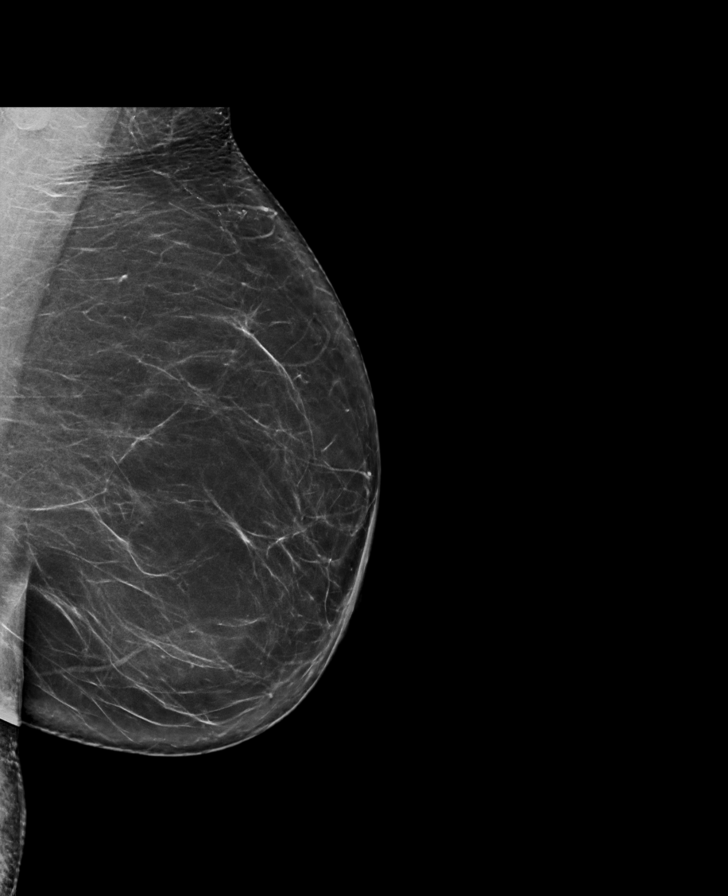

[R MLO synth-2D]
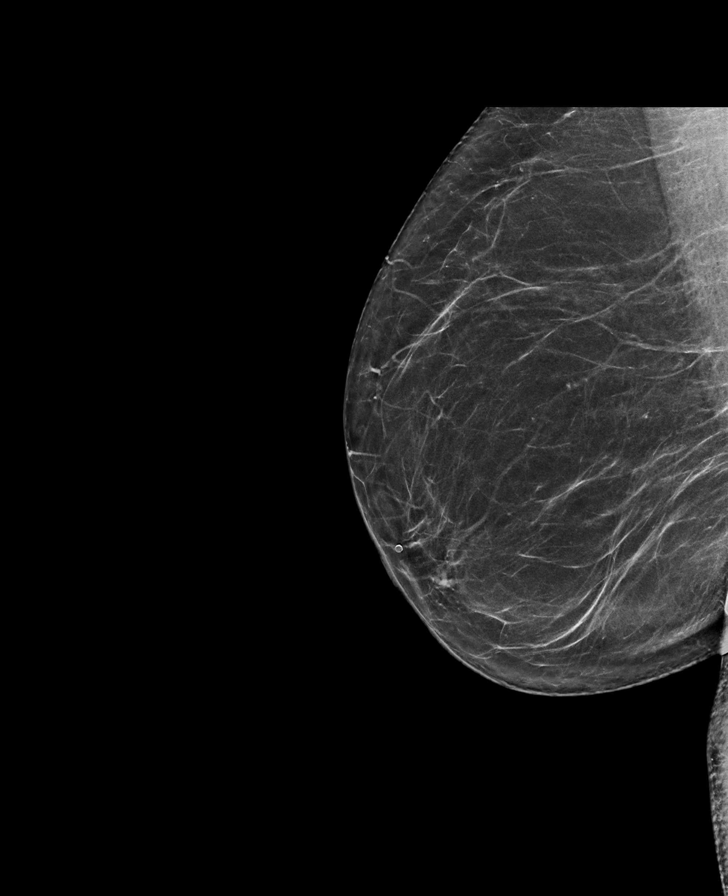

[R MLO tomo · tomo slice 37/73.0]
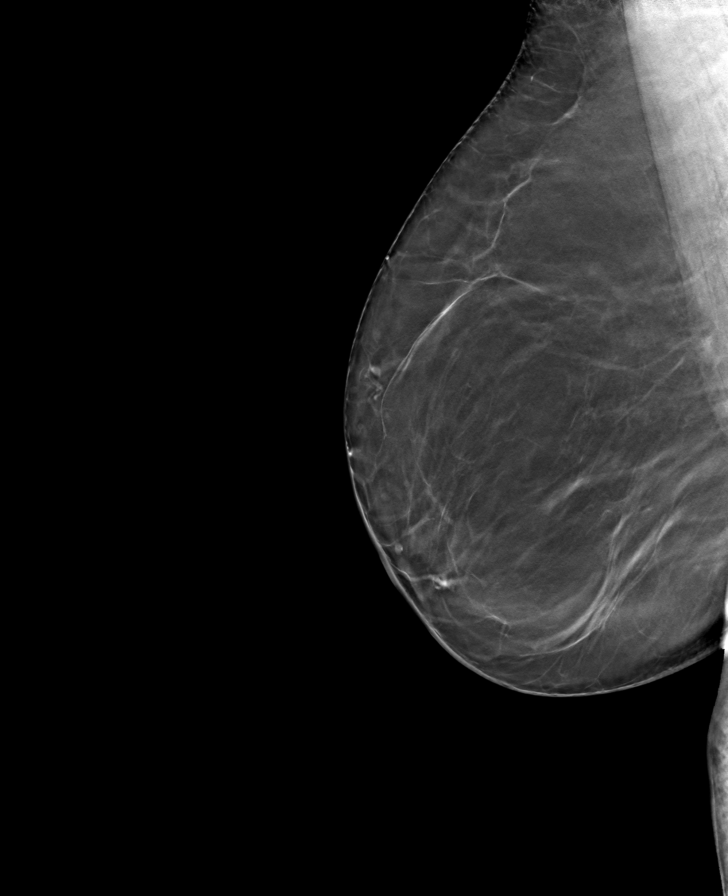

[L CC tomo · tomo slice 35/70.0]
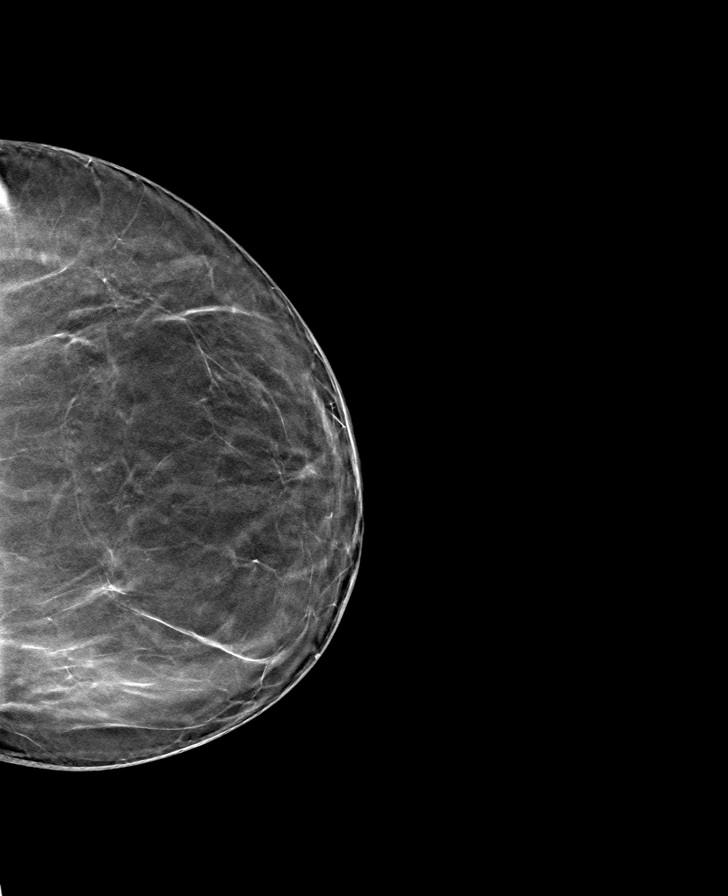

[R CC tomo · tomo slice 36/71.0]
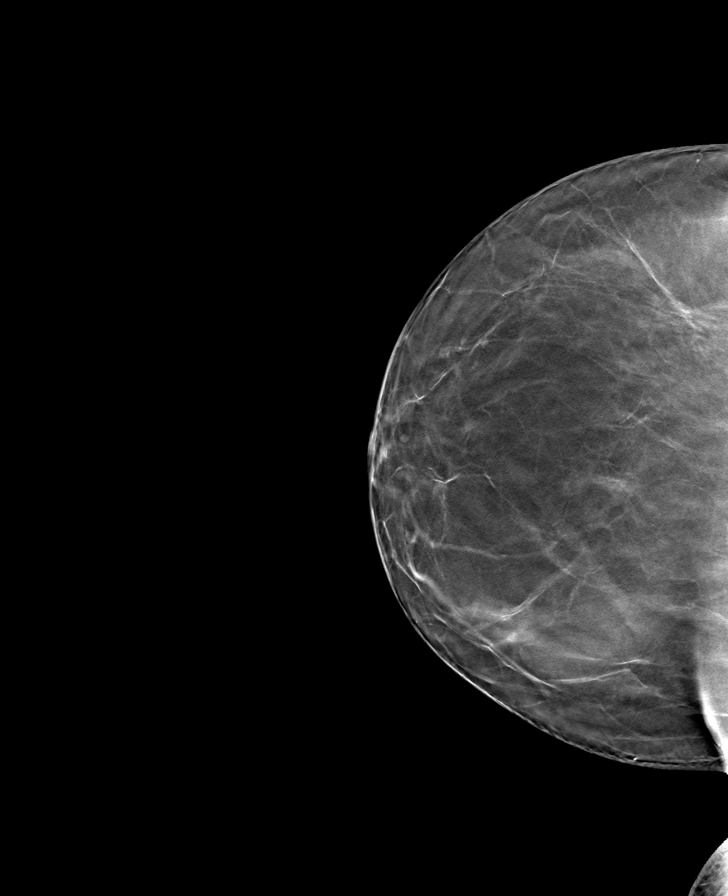

[L MLO tomo · tomo slice 41/82.0]
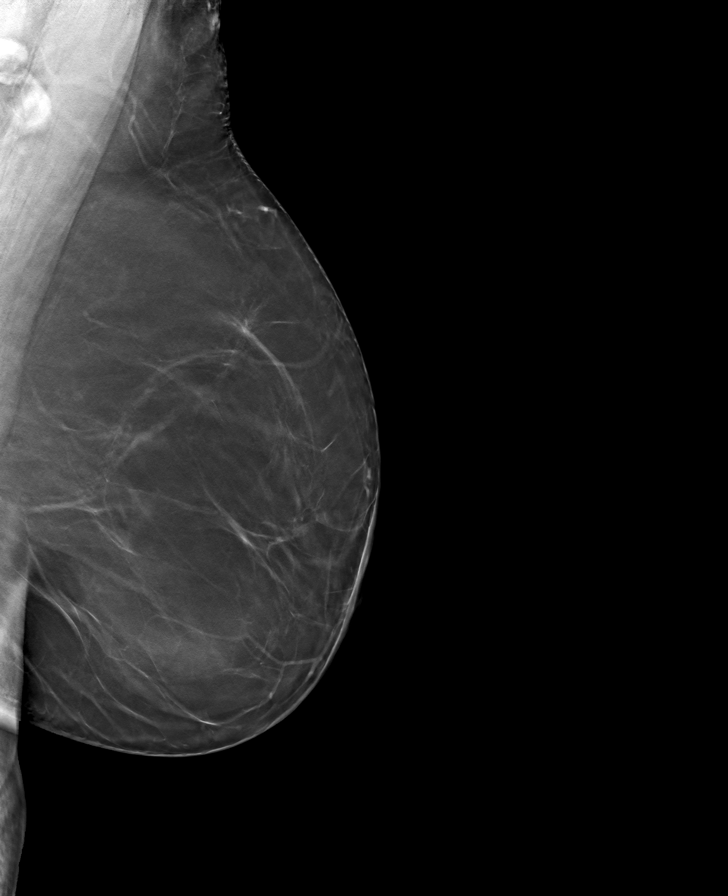

[8 of 24 positions shown; findings below may reference images not displayed]

FINDINGS: There are no findings suspicious for malignancy. Images were
processed with CAD.
IMPRESSION: No mammographic evidence of malignancy. A result letter of this
screening mammogram will be mailed directly to the patient.

RECOMMENDATION:
Screening mammogram in one year. (Code:[id number])

BI-RADS CATEGORY  1: Negative.

## 2019-03-21 DIAGNOSIS — J4520 Mild intermittent asthma, uncomplicated: Secondary | ICD-10-CM | POA: Diagnosis not present

## 2019-06-20 DIAGNOSIS — E049 Nontoxic goiter, unspecified: Secondary | ICD-10-CM | POA: Diagnosis not present

## 2019-06-20 DIAGNOSIS — J4520 Mild intermittent asthma, uncomplicated: Secondary | ICD-10-CM | POA: Diagnosis not present

## 2019-06-20 DIAGNOSIS — Z0000 Encounter for general adult medical examination without abnormal findings: Secondary | ICD-10-CM | POA: Diagnosis not present

## 2019-06-20 DIAGNOSIS — E7800 Pure hypercholesterolemia, unspecified: Secondary | ICD-10-CM | POA: Diagnosis not present

## 2019-06-20 DIAGNOSIS — F325 Major depressive disorder, single episode, in full remission: Secondary | ICD-10-CM | POA: Diagnosis not present

## 2020-03-21 DIAGNOSIS — R768 Other specified abnormal immunological findings in serum: Secondary | ICD-10-CM | POA: Diagnosis not present

## 2020-03-21 DIAGNOSIS — M064 Inflammatory polyarthropathy: Secondary | ICD-10-CM | POA: Diagnosis not present

## 2020-03-21 DIAGNOSIS — Z6826 Body mass index (BMI) 26.0-26.9, adult: Secondary | ICD-10-CM | POA: Diagnosis not present

## 2020-03-21 DIAGNOSIS — E663 Overweight: Secondary | ICD-10-CM | POA: Diagnosis not present

## 2020-03-21 DIAGNOSIS — M150 Primary generalized (osteo)arthritis: Secondary | ICD-10-CM | POA: Diagnosis not present

## 2020-03-21 DIAGNOSIS — Z1589 Genetic susceptibility to other disease: Secondary | ICD-10-CM | POA: Diagnosis not present

## 2020-04-11 DIAGNOSIS — M7580 Other shoulder lesions, unspecified shoulder: Secondary | ICD-10-CM | POA: Diagnosis not present

## 2020-04-20 DIAGNOSIS — M064 Inflammatory polyarthropathy: Secondary | ICD-10-CM | POA: Diagnosis not present

## 2020-04-20 DIAGNOSIS — R768 Other specified abnormal immunological findings in serum: Secondary | ICD-10-CM | POA: Diagnosis not present

## 2020-04-20 DIAGNOSIS — M150 Primary generalized (osteo)arthritis: Secondary | ICD-10-CM | POA: Diagnosis not present

## 2020-04-20 DIAGNOSIS — E663 Overweight: Secondary | ICD-10-CM | POA: Diagnosis not present

## 2020-04-20 DIAGNOSIS — Z1589 Genetic susceptibility to other disease: Secondary | ICD-10-CM | POA: Diagnosis not present

## 2020-04-20 DIAGNOSIS — Z6826 Body mass index (BMI) 26.0-26.9, adult: Secondary | ICD-10-CM | POA: Diagnosis not present

## 2020-04-20 DIAGNOSIS — M2550 Pain in unspecified joint: Secondary | ICD-10-CM | POA: Diagnosis not present

## 2020-07-04 DIAGNOSIS — M150 Primary generalized (osteo)arthritis: Secondary | ICD-10-CM | POA: Diagnosis not present

## 2020-07-04 DIAGNOSIS — Z1589 Genetic susceptibility to other disease: Secondary | ICD-10-CM | POA: Diagnosis not present

## 2020-07-04 DIAGNOSIS — M0579 Rheumatoid arthritis with rheumatoid factor of multiple sites without organ or systems involvement: Secondary | ICD-10-CM | POA: Diagnosis not present

## 2020-07-04 DIAGNOSIS — M2550 Pain in unspecified joint: Secondary | ICD-10-CM | POA: Diagnosis not present

## 2020-07-09 DIAGNOSIS — H5203 Hypermetropia, bilateral: Secondary | ICD-10-CM | POA: Diagnosis not present

## 2020-07-09 DIAGNOSIS — H2513 Age-related nuclear cataract, bilateral: Secondary | ICD-10-CM | POA: Diagnosis not present

## 2020-08-20 DIAGNOSIS — Z23 Encounter for immunization: Secondary | ICD-10-CM | POA: Diagnosis not present

## 2020-08-20 DIAGNOSIS — Z0000 Encounter for general adult medical examination without abnormal findings: Secondary | ICD-10-CM | POA: Diagnosis not present

## 2020-08-20 DIAGNOSIS — F325 Major depressive disorder, single episode, in full remission: Secondary | ICD-10-CM | POA: Diagnosis not present

## 2020-09-11 DIAGNOSIS — E663 Overweight: Secondary | ICD-10-CM | POA: Diagnosis not present

## 2020-09-11 DIAGNOSIS — E7800 Pure hypercholesterolemia, unspecified: Secondary | ICD-10-CM | POA: Diagnosis not present

## 2020-09-11 DIAGNOSIS — Z6828 Body mass index (BMI) 28.0-28.9, adult: Secondary | ICD-10-CM | POA: Diagnosis not present

## 2020-09-11 DIAGNOSIS — F325 Major depressive disorder, single episode, in full remission: Secondary | ICD-10-CM | POA: Diagnosis not present

## 2020-09-11 DIAGNOSIS — L659 Nonscarring hair loss, unspecified: Secondary | ICD-10-CM | POA: Diagnosis not present

## 2020-09-20 DIAGNOSIS — Z6827 Body mass index (BMI) 27.0-27.9, adult: Secondary | ICD-10-CM | POA: Diagnosis not present

## 2020-09-20 DIAGNOSIS — M150 Primary generalized (osteo)arthritis: Secondary | ICD-10-CM | POA: Diagnosis not present

## 2020-09-20 DIAGNOSIS — Z1589 Genetic susceptibility to other disease: Secondary | ICD-10-CM | POA: Diagnosis not present

## 2020-09-20 DIAGNOSIS — E663 Overweight: Secondary | ICD-10-CM | POA: Diagnosis not present

## 2020-09-20 DIAGNOSIS — M0579 Rheumatoid arthritis with rheumatoid factor of multiple sites without organ or systems involvement: Secondary | ICD-10-CM | POA: Diagnosis not present

## 2020-09-20 DIAGNOSIS — M2550 Pain in unspecified joint: Secondary | ICD-10-CM | POA: Diagnosis not present

## 2020-09-26 DIAGNOSIS — M7582 Other shoulder lesions, left shoulder: Secondary | ICD-10-CM | POA: Diagnosis not present

## 2020-09-26 DIAGNOSIS — M25512 Pain in left shoulder: Secondary | ICD-10-CM | POA: Diagnosis not present

## 2020-10-17 ENCOUNTER — Ambulatory Visit: Payer: PPO | Attending: Sports Medicine | Admitting: Physical Therapy

## 2020-10-17 ENCOUNTER — Other Ambulatory Visit: Payer: Self-pay

## 2020-10-17 DIAGNOSIS — R293 Abnormal posture: Secondary | ICD-10-CM | POA: Insufficient documentation

## 2020-10-17 DIAGNOSIS — R252 Cramp and spasm: Secondary | ICD-10-CM | POA: Diagnosis not present

## 2020-10-17 DIAGNOSIS — M25612 Stiffness of left shoulder, not elsewhere classified: Secondary | ICD-10-CM | POA: Insufficient documentation

## 2020-10-17 DIAGNOSIS — M25512 Pain in left shoulder: Secondary | ICD-10-CM | POA: Diagnosis not present
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
## 2020-10-17 NOTE — Patient Instructions (Signed)
Access Code: [email or web address] URL: [email or web address] Date: 10/17/2020 Prepared by: Dwana Curd  Exercises Standing Sidebending with Chair Support - 1 x daily - 7 x weekly - 3 sets - 10 reps Seated Thoracic Flexion and Rotation with Arms Crossed - 1 x daily - 7 x weekly - 10 reps - 1 sets - 5 sec hold Seated Thoracic Extension and Rotation with Reach - 1 x daily - 7 x weekly - 3 sets - 10 reps Standing Shoulder Flexion Wall Walk - 1 x daily - 7 x weekly - 3 sets - 10 reps  Patient Education Office Posture

## 2020-10-18 ENCOUNTER — Ambulatory Visit: Payer: PPO | Admitting: Physical Therapy

## 2020-10-18 DIAGNOSIS — R293 Abnormal posture: Secondary | ICD-10-CM

## 2020-10-18 DIAGNOSIS — M25512 Pain in left shoulder: Secondary | ICD-10-CM | POA: Diagnosis not present

## 2020-10-18 DIAGNOSIS — R252 Cramp and spasm: Secondary | ICD-10-CM

## 2020-10-18 DIAGNOSIS — M25612 Stiffness of left shoulder, not elsewhere classified: Secondary | ICD-10-CM

## 2020-10-18 NOTE — Patient Instructions (Signed)
Access Code: [email or web address] URL: [email or web address] Date: 10/18/2020 Prepared by: Lavinia Sharps  Exercises Standing Sidebending with Chair Support - 1 x daily - 7 x weekly - 3 sets - 10 reps Seated Thoracic Flexion and Rotation with Arms Crossed - 1 x daily - 7 x weekly - 10 reps - 1 sets - 5 sec hold Seated Thoracic Extension and Rotation with Reach - 1 x daily - 7 x weekly - 3 sets - 10 reps Standing Shoulder Flexion Wall Walk - 1 x daily - 7 x weekly - 3 sets - 10 reps Thoracic Extension Mobilization with Noodle - 1 x daily - 7 x weekly - 1 sets - 10 reps Seated Thoracic Lumbar Extension with Pectoralis Stretch - 1 x daily - 7 x weekly - 1 sets - 10 reps Standing Single Arm Row with Resistance Thumb Up - 1 x daily - 7 x weekly - 1 sets - 10 reps Single Arm Shoulder Extension with Anchored Resistance - 1 x daily - 7 x weekly - 1 sets - 10 reps Standing Tricep Extensions with Resistance - 1 x daily - 7 x weekly - 1 sets - 10 reps  Patient Education Office Posture
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
## 2020-10-22 DIAGNOSIS — Z1231 Encounter for screening mammogram for malignant neoplasm of breast: Secondary | ICD-10-CM | POA: Diagnosis not present

## 2020-10-23 ENCOUNTER — Encounter: Payer: Self-pay | Admitting: Physical Therapy

## 2020-10-23 ENCOUNTER — Other Ambulatory Visit: Payer: Self-pay

## 2020-10-23 ENCOUNTER — Ambulatory Visit: Payer: PPO | Admitting: Physical Therapy

## 2020-10-23 DIAGNOSIS — M25512 Pain in left shoulder: Secondary | ICD-10-CM

## 2020-10-23 DIAGNOSIS — R293 Abnormal posture: Secondary | ICD-10-CM

## 2020-10-23 DIAGNOSIS — M25612 Stiffness of left shoulder, not elsewhere classified: Secondary | ICD-10-CM

## 2020-10-23 DIAGNOSIS — R252 Cramp and spasm: Secondary | ICD-10-CM

## 2020-10-23 NOTE — Therapy (Signed)
[facility] [address] [place], [place], [address] Phone: [phone]   Fax:  [phone]  Physical Therapy Treatment  Patient Details  Name: Priscilla Wilson MRN: [id number] Date of Birth: 05-13-1951 Referring Provider (PT): Christena Deem, MD   Encounter Date: 10/23/2020   PT End of Session - 10/23/20 1221    Visit Number 3    Date for PT Re-Evaluation 01/09/21    PT Start Time 1223    PT Stop Time 1310    PT Time Calculation (min) 47 min    Activity Tolerance Patient tolerated treatment well    Behavior During Therapy [facility] for tasks assessed/performed           History reviewed. No pertinent past medical history.  Past Surgical History:  Procedure Laterality Date  . BREAST EXCISIONAL BIOPSY Left    benign    There were no vitals filed for this visit.   Subjective Assessment - 10/23/20 1221    Subjective My pain was doing better and the HEP was helping but I had a mammogram yesterday and had to position my arm in a way that aggravated things.  I didn't have a good night.    Pertinent History medications not up to date but unable to find with search: pt reports taking slow release calcium    Limitations Lifting    Patient Stated Goals strengthen and reduce pain    Currently in Pain? Yes    Pain Score 4     Pain Location Shoulder    Pain Orientation Left    Pain Descriptors / Indicators Sore;Aching;Tightness    Pain Type Chronic pain    Pain Onset More than a month ago    Pain Frequency Intermittent    Aggravating Factors  reaching, sleeping on Lt side    Pain Relieving Factors exercises and injection I had    Effect of Pain on Daily Activities sleeping and reaching to side and overhead                             [facility] Adult PT Treatment/Exercise - 10/23/20 0001      Therapeutic Activites    Therapeutic Activities Other Therapeutic Activities    Other Therapeutic Activities  Lt reaching taps from counter to overhead cabinets 3 different heights x 3 rounds      Exercises   Exercises Shoulder;Neck      Neck Exercises: Seated   Other Seated Exercise Lt upper trap and levator stretch with overpressure 1x30 each    Other Seated Exercise backward shoulder rolls x 10      Shoulder Exercises: Prone   Retraction Strengthening;Both;10 reps    Extension Both;10 reps;Strengthening      Shoulder Exercises: Standing   External Rotation Strengthening;Left;10 reps;Theraband    Theraband Level (Shoulder External Rotation) Level 2 (Red)    External Rotation Limitations add towel roll inside elbow    Internal Rotation Strengthening;Left;10 reps;Theraband;Other (comment)    Theraband Level (Shoulder Internal Rotation) Level 2 (Red)    Internal Rotation Limitations add towel roll inside elbow    Extension Both;10 reps;Theraband    Theraband Level (Shoulder Extension) Level 2 (Red)    Row Strengthening;Both;10 reps;Theraband    Theraband Level (Shoulder Row) Level 2 (Red)    Other Standing Exercises triceps extension red band 10x bil    Other Standing Exercises flexion wall slides x 10 reps  Shoulder Exercises: ROM/Strengthening   Ranger Lt UE flexion x 10 reps, PT TC and VC to drop scapula with overhead position of Lt UE      Manual Therapy   Manual Therapy Soft tissue mobilization;Other (comment);Joint mobilization    Joint Mobilization costotransverse and costovertebral joints on Lt T2-T5 Gr I-III PAs, C7-T2 PAs Gr I-III 1x30 sec each segment    Soft tissue mobilization left upper trap and levator elongation and TP release    Other Manual Therapy Lt scapular PNF for retraction/depression and elevation in Rt SL                  PT Education - 10/23/20 1310    Education Details swapped wall finger climb with towel slides due to Pt preference    Person(s) Educated Patient    Methods Explanation;Handout;Demonstration    Comprehension Verbalized  understanding;Returned demonstration            PT Short Term Goals - 10/23/20 1320      PT SHORT TERM GOAL #1   Title The patient will have a good understanding of postural correction for reduced pain    Status On-going      PT SHORT TERM GOAL #2   Title The patient will report improved sleep by 25%    Status On-going             PT Long Term Goals - 10/17/20 2101      PT LONG TERM GOAL #1   Title The patient will be independent in  HEP    Time 12    Period Weeks    Status New    Target Date 01/09/21      PT LONG TERM GOAL #2   Title The patient will report a 50% improvement in sleep    Time 12    Period Weeks    Status New    Target Date 01/09/21      PT LONG TERM GOAL #3   Title FOTO < or = to 32% limited    Time 12    Period Weeks    Status New      PT LONG TERM GOAL #4   Title MMT Lt shoulder at least 4+/5 flexion and abduction for improved overhead tasks    Time 12    Period Weeks    Status New    Target Date 01/09/21      PT LONG TERM GOAL #5   Title Shoulder ROM equal bilaterally for reaching to overhead shelves    Time 12    Period Weeks    Status New    Target Date 01/09/21                 Plan - 10/23/20 1316    Clinical Impression Statement Pt arrived with heightened pain following a mammogram yesterday secondary to postioning of the arm for the procedure.  Pain rating on arrival was 4/10.  Pt had TPs in levator and upper trap and poor scapular mechanics with Lt shoulder flexion.  Manual techniques and stretches used for soft tissue elongation, along with joint mobs to Lt upper quadrant.  Improved awareness of lower trap activation following PNF and prone retraction.  She was able to better demo control of scapula with AA/ROM and A/ROM with Lt UE flexion by end of session.  She was able to perform functional reaching to overhead shelves (unweighted) without pain and good scapular mechanics.  PT added towel roll inside  elbow for IR/ER with  band that she was doing from her MD which helped neutral shoulder maintenance during ther ex.  She reported reduction of pain to 2/10 and less tension/tightness end of session. Continue along POC.    PT Frequency 2x / week    PT Duration 12 weeks    PT Treatment/Interventions ADLs/Self Care Home Management;Cryotherapy;Electrical Stimulation;Iontophoresis 4mg /ml Dexamethasone;Moist Heat;Ultrasound;Therapeutic activities;Therapeutic exercise;Neuromuscular re-education;Patient/family education;Manual techniques;Passive range of motion;Dry needling;Taping    PT Next Visit Plan thoracic extension; review red band ex's; manual techniques; progress shoulder strengthening;  upper trap elongation, Ranger for scapular control then A/ROM functional reaching    PT Home Exercise Plan [email or web address]    Consulted and Agree with Plan of Care Patient           Patient will benefit from skilled therapeutic intervention in order to improve the following deficits and impairments:     Visit Diagnosis: Acute pain of left shoulder  Stiffness of left shoulder, not elsewhere classified  Abnormal posture  Cramp and spasm     Problem List There are no problems to display for this patient.   Chrishawn Kring, PT 10/23/20 1:21 PM   [facility] [address], [address] [place], [place], [place] Phone: [phone]   Fax:  [phone]  Name: Priscilla Wilson MRN: [name] Date of Birth: 1951/02/18

## 2020-10-23 NOTE — Patient Instructions (Signed)
Access Code: [email or web address] URL: [email or web address] Date: 10/23/2020 Prepared by: Loistine Simas Mathilda Maguire  Exercises Standing Sidebending with Chair Support - 1 x daily - 7 x weekly - 3 sets - 10 reps Seated Thoracic Flexion and Rotation with Arms Crossed - 1 x daily - 7 x weekly - 10 reps - 1 sets - 5 sec hold Seated Thoracic Extension and Rotation with Reach - 1 x daily - 7 x weekly - 3 sets - 10 reps Shoulder Flexion Wall Slide with Towel - 1 x daily - 7 x weekly - 2 sets - 10 reps Thoracic Extension Mobilization with Noodle - 1 x daily - 7 x weekly - 1 sets - 10 reps Seated Thoracic Lumbar Extension with Pectoralis Stretch - 1 x daily - 7 x weekly - 1 sets - 10 reps Standing Single Arm Row with Resistance Thumb Up - 1 x daily - 7 x weekly - 1 sets - 10 reps Single Arm Shoulder Extension with Anchored Resistance - 1 x daily - 7 x weekly - 1 sets - 10 reps Standing Tricep Extensions with Resistance - 1 x daily - 7 x weekly - 1 sets - 10 reps  Patient Education Office Posture

## 2020-10-30 ENCOUNTER — Ambulatory Visit: Payer: PPO | Admitting: Physical Therapy

## 2020-10-30 ENCOUNTER — Other Ambulatory Visit: Payer: Self-pay

## 2020-10-30 DIAGNOSIS — M25512 Pain in left shoulder: Secondary | ICD-10-CM | POA: Diagnosis not present

## 2020-10-30 DIAGNOSIS — M25612 Stiffness of left shoulder, not elsewhere classified: Secondary | ICD-10-CM

## 2020-10-30 NOTE — Therapy (Signed)
[facility] [address], [address] [place], [place], [address] Phone: [phone]   Fax:  [phone]  Physical Therapy Treatment  Patient Details  Name: Priscilla Wilson MRN: [id number] Date of Birth: [birth year] Referring Provider (PT): Christena Deem, MD   Encounter Date: 10/30/2020   PT End of Session - 10/30/20 1703    Visit Number 4    Date for PT Re-Evaluation 01/09/21    PT Start Time 1615    PT Stop Time 1658    PT Time Calculation (min) 43 min    Activity Tolerance Patient tolerated treatment well           No past medical history on file.  Past Surgical History:  Procedure Laterality Date  . BREAST EXCISIONAL BIOPSY Left    benign    There were no vitals filed for this visit.   Subjective Assessment - 10/30/20 1615    Subjective Have done a lot of baking. Really sore this morning.  Upper arm and top of the shoulder.    Pertinent History medications not up to date but unable to find with search: pt reports taking slow release calcium    Currently in Pain? Yes    Pain Score 4     Pain Location Shoulder    Pain Orientation Left              [facility] PT Assessment - 10/30/20 0001      AROM   Left Shoulder Flexion 130 Degrees    Left Shoulder ABduction 120 Degrees                         [facility] Adult PT Treatment/Exercise - 10/30/20 0001      Shoulder Exercises: Seated   Other Seated Exercises verbal cues of current HEP      Shoulder Exercises: Stretch   Other Shoulder Stretches inferior capsule/upper trap stretch 3x    Other Shoulder Stretches posterior capsule stretch with opp hand at elbow and on doorway per HEP      Moist Heat Therapy   Number Minutes Moist Heat 3 Minutes    Moist Heat Location Shoulder      Manual Therapy   Joint Mobilization glenohumeral distraction, inferior and posterior joint mobs grade 3 3x 30sec;  scapular medial and lateral glides    Soft tissue  mobilization left upper trap and levator elongation and TP release; supraspinatus and infraspinatus; levator scap            Trigger Point Dry Needling - 10/30/20 0001    Consent Given? Yes    Education Handout Provided Yes    Muscles Treated Head and Neck Upper trapezius    Muscles Treated Upper Quadrant Supraspinatus;Infraspinatus    Upper Trapezius Response Twitch reponse elicited;Palpable increased muscle length    Supraspinatus Response Palpable increased muscle length    Infraspinatus Response Twitch response elicited;Palpable increased muscle length                PT Education - 10/30/20 1656    Education Details dry needling after care;  inferior and posterior capsular stretches    Person(s) Educated Patient    Methods Explanation;Demonstration;Handout    Comprehension Returned demonstration;Verbalized understanding            PT Short Term Goals - 10/23/20 1320      PT SHORT TERM GOAL #1   Title The patient will have a good understanding of postural correction  for reduced pain    Status On-going      PT SHORT TERM GOAL #2   Title The patient will report improved sleep by 25%    Status On-going             PT Long Term Goals - 10/17/20 2101      PT LONG TERM GOAL #1   Title The patient will be independent in  HEP    Time 12    Period Weeks    Status New    Target Date 01/09/21      PT LONG TERM GOAL #2   Title The patient will report a 50% improvement in sleep    Time 12    Period Weeks    Status New    Target Date 01/09/21      PT LONG TERM GOAL #3   Title FOTO < or = to 32% limited    Time 12    Period Weeks    Status New      PT LONG TERM GOAL #4   Title MMT Lt shoulder at least 4+/5 flexion and abduction for improved overhead tasks    Time 12    Period Weeks    Status New    Target Date 01/09/21      PT LONG TERM GOAL #5   Title Shoulder ROM equal bilaterally for reaching to overhead shelves    Time 12    Period Weeks     Status New    Target Date 01/09/21                 Plan - 10/30/20 1644    Clinical Impression Statement The patient has improving shoulder ROM but still painful in upper arm at night but also with activity (lots of baking this week).  She has tender points in left upper traps and is receptive to DN since she has had this in the past.  Following DN and manual therapy she has an even more dramatic improvement in shoulder elevation ROM to > 150 degrees.  She is instructed in glenohumeral joint mobilty to help maintain these significant gains today.  Therapist closely monitoring response with all interventions.    Personal Factors and Comorbidities Comorbidity 1;Time since onset of injury/illness/exacerbation    Examination-Activity Limitations Carry;Lift;Reach Overhead;Bathing;Hygiene/Grooming;Sleep    Rehab Potential Excellent    PT Frequency 2x / week    PT Duration 12 weeks    PT Treatment/Interventions ADLs/Self Care Home Management;Cryotherapy;Electrical Stimulation;Iontophoresis 4mg /ml Dexamethasone;Moist Heat;Ultrasound;Therapeutic activities;Therapeutic exercise;Neuromuscular re-education;Patient/family education;Manual techniques;Passive range of motion;Dry needling;Taping    PT Next Visit Plan assess response to DN#1;  glenohumeral joint mobs and capsular stretches;  shoulder and scapular strengthening    PT Home Exercise Plan [facility]           Patient will benefit from skilled therapeutic intervention in order to improve the following deficits and impairments:  Pain,Impaired perceived functional ability,Increased muscle spasms,Hypomobility,Postural dysfunction,Improper body mechanics,Decreased strength,Decreased range of motion,Increased fascial restricitons,Impaired UE functional use  Visit Diagnosis: Acute pain of left shoulder  Stiffness of left shoulder, not elsewhere classified     Problem List There are no problems to display for this patient.  [facility],  PT 10/30/20 5:09 PM Phone: [phone] Fax: [phone] 10/30/2020, 5:09 PM  [facility] [address], [address] [place], [place], [place] Phone: [phone]   Fax:  [phone]  Name: Priscilla Wilson MRN: [name] Date of Birth: [birth year]

## 2020-10-30 NOTE — Patient Instructions (Signed)
Access Code: [email or web address] URL: [email or web address] Date: 10/30/2020 Prepared by: Lavinia Sharps  Exercises Standing Sidebending with Chair Support - 1 x daily - 7 x weekly - 3 sets - 10 reps Seated Thoracic Flexion and Rotation with Arms Crossed - 1 x daily - 7 x weekly - 10 reps - 1 sets - 5 sec hold Seated Thoracic Extension and Rotation with Reach - 1 x daily - 7 x weekly - 3 sets - 10 reps Shoulder Flexion Wall Slide with Towel - 1 x daily - 7 x weekly - 2 sets - 10 reps Thoracic Extension Mobilization with Noodle - 1 x daily - 7 x weekly - 1 sets - 10 reps Seated Thoracic Lumbar Extension with Pectoralis Stretch - 1 x daily - 7 x weekly - 1 sets - 10 reps Standing Single Arm Row with Resistance Thumb Up - 1 x daily - 7 x weekly - 1 sets - 10 reps Single Arm Shoulder Extension with Anchored Resistance - 1 x daily - 7 x weekly - 1 sets - 10 reps Standing Tricep Extensions with Resistance - 1 x daily - 7 x weekly - 1 sets - 10 reps Doorway Rhomboid Stretch - 1 x daily - 7 x weekly - 1 sets - 3 reps - 30 hold Seated Shoulder Inferior Glide - 1 x daily - 7 x weekly - 1 sets - 3 reps  Patient Education Office Posture   Trigger Point Dry Needling  . What is Trigger Point Dry Needling (DN)? o DN is a physical therapy technique used to treat muscle pain and dysfunction. Specifically, DN helps deactivate muscle trigger points (muscle knots).  o A thin filiform needle is used to penetrate the skin and stimulate the underlying trigger point. The goal is for a local twitch response (LTR) to occur and for the trigger point to relax. No medication of any kind is injected during the procedure.   . What Does Trigger Point Dry Needling Feel Like?  o The procedure feels different for each individual patient. Some patients report that they do not actually feel the needle enter the skin and overall the process is not painful. Very mild bleeding may occur. However, many patients feel a deep  cramping in the muscle in which the needle was inserted. This is the local twitch response.   Marland Kitchen How Will I feel after the treatment? o Soreness is normal, and the onset of soreness may not occur for a few hours. Typically this soreness does not last longer than two days.  o Bruising is uncommon, however; ice can be used to decrease any possible bruising.  o In rare cases feeling tired or nauseous after the treatment is normal. In addition, your symptoms may get worse before they get better, this period will typically not last longer than 24 hours.   . What Can I do After My Treatment? o Increase your hydration by drinking more water for the next 24 hours. o You may place ice or heat on the areas treated that have become sore, however, do not use heat on inflamed or bruised areas. Heat often brings more relief post needling. o You can continue your regular activities, but vigorous activity is not recommended initially after the treatment for 24 hours. o DN is best combined with other physical therapy such as strengthening, stretching, and other therapies.

## 2020-11-13 ENCOUNTER — Other Ambulatory Visit: Payer: Self-pay

## 2020-11-13 ENCOUNTER — Ambulatory Visit: Payer: PPO | Attending: Sports Medicine | Admitting: Physical Therapy

## 2020-11-13 DIAGNOSIS — R293 Abnormal posture: Secondary | ICD-10-CM | POA: Insufficient documentation

## 2020-11-13 DIAGNOSIS — R252 Cramp and spasm: Secondary | ICD-10-CM | POA: Insufficient documentation

## 2020-11-13 DIAGNOSIS — M25512 Pain in left shoulder: Secondary | ICD-10-CM | POA: Diagnosis not present

## 2020-11-13 DIAGNOSIS — M25612 Stiffness of left shoulder, not elsewhere classified: Secondary | ICD-10-CM | POA: Insufficient documentation

## 2020-11-13 NOTE — Therapy (Signed)
**Note De-Identified Priscilla Obfuscation** [facility] [address] [place], [place], [address] Phone: [phone]   Fax:  [phone]  Physical Therapy Treatment  Patient Details  Name: Priscilla Wilson MRN: [id number] Date of Birth: [birth year] Referring Provider (PT): Christena Deem, MD   Encounter Date: 11/13/2020   PT End of Session - 11/13/20 1834                                  Visit #5   Date for PT Re-Evaluation 01/09/21    Authorization Type [employer]    PT Start Time 0800    PT Stop Time 0844    PT Time Calculation (min) 44 min    Activity Tolerance Patient tolerated treatment well           No past medical history on file.  Past Surgical History:  Procedure Laterality Date  . BREAST EXCISIONAL BIOPSY Left    benign    There were no vitals filed for this visit.   Subjective Assessment - 11/13/20 0803    Subjective Most of the time my shoulder is fine but this morning it hurts to abduct.  I can only lift about this far (90 degrees).  I had good ROM after the DN.  Sore that night but not too bad.  Hurts at night.    Pertinent History medications not up to date but unable to find with search: pt reports taking slow release calcium    Patient Stated Goals strengthen and reduce pain    Currently in Pain? Yes    Pain Score 4     Pain Location Shoulder    Pain Orientation Left    Pain Type Chronic pain              [facility] PT Assessment - 11/13/20 0001      AROM   Left Shoulder Flexion 173 Degrees   end of session   Left Shoulder ABduction 165 Degrees   after treatment session                        [facility] Adult PT Treatment/Exercise - 11/13/20 0001      Shoulder Exercises: Standing   Other Standing Exercises towel roll in axilla with self inferior distraction 5x      Shoulder Exercises: ROM/Strengthening   Other ROM/Strengthening Exercises seated flexion on table with self inferior mob 10x at various ranges       Moist Heat Therapy   Number Minutes Moist Heat 3 Minutes    Moist Heat Location Shoulder      Manual Therapy   Joint Mobilization glenohumeral distraction, inferior and posterior joint mobs grade 3 3x 30sec;  scapular medial and lateral glides    Soft tissue mobilization left upper trap and levator elongation and TP release; supraspinatus and infraspinatus; levator scap            Trigger Point Dry Needling - 11/13/20 0001    Consent Given? Yes    Upper Trapezius Response Twitch reponse elicited;Palpable increased muscle length    Supraspinatus Response Palpable increased muscle length    Infraspinatus Response Twitch response elicited;Palpable increased muscle length                  PT Short Term Goals - 11/13/20 1843      PT SHORT TERM GOAL #1   Title The patient will have  a good understanding of postural correction for reduced pain    Status Achieved      PT SHORT TERM GOAL #2   Title The patient will report improved sleep by 25%    Status Achieved      PT SHORT TERM GOAL #3   Title .Marland KitchenMarland Kitchen             PT Long Term Goals - 10/17/20 2101      PT LONG TERM GOAL #1   Title The patient will be independent in  HEP    Time 12    Period Weeks    Status New    Target Date 01/09/21      PT LONG TERM GOAL #2   Title The patient will report a 50% improvement in sleep    Time 12    Period Weeks    Status New    Target Date 01/09/21      PT LONG TERM GOAL #3   Title FOTO < or = to 32% limited    Time 12    Period Weeks    Status New      PT LONG TERM GOAL #4   Title MMT Lt shoulder at least 4+/5 flexion and abduction for improved overhead tasks    Time 12    Period Weeks    Status New    Target Date 01/09/21      PT LONG TERM GOAL #5   Title Shoulder ROM equal bilaterally for reaching to overhead shelves    Time 12    Period Weeks    Status New    Target Date 01/09/21                 Plan - 11/13/20 1835    Clinical Impression Statement  Improving shoulder flexion and abduction ROM.  Several tender points in upper traps secondary to compensatory overuse.  Improving glenohumeral joint mobility after manual therapy and immediately following treatment she reports she feels better.   The patient perceives progress as slow and is considering following up with her doctor.  She had an injection in December.  Therapist following response with all treatment interventions.    Personal Factors and Comorbidities Comorbidity 1;Time since onset of injury/illness/exacerbation    Examination-Activity Limitations Carry;Lift;Reach Overhead;Bathing;Hygiene/Grooming;Sleep    Rehab Potential Excellent    PT Frequency 2x / week    PT Duration 12 weeks    PT Treatment/Interventions ADLs/Self Care Home Management;Cryotherapy;Electrical Stimulation;Iontophoresis 4mg /ml Dexamethasone;Moist Heat;Ultrasound;Therapeutic activities;Therapeutic exercise;Neuromuscular re-education;Patient/family education;Manual techniques;Passive range of motion;Dry needling;Taping    PT Next Visit Plan assess response to DN#2;  glenohumeral joint mobs and capsular stretches;  shoulder and scapular strengthening    PT Home Exercise Plan [facility]           Patient will benefit from skilled therapeutic intervention in order to improve the following deficits and impairments:  Pain,Impaired perceived functional ability,Increased muscle spasms,Hypomobility,Postural dysfunction,Improper body mechanics,Decreased strength,Decreased range of motion,Increased fascial restricitons,Impaired UE functional use  Visit Diagnosis: Acute pain of left shoulder  Stiffness of left shoulder, not elsewhere classified  Abnormal posture  Cramp and spasm     Problem List There are no problems to display for this patient.  Ruben Im, PT 11/13/20 6:46 PM Phone: [phone] Fax: [phone]  Alvera Singh 11/13/2020, 6:45 PM  [facility] [address] [place], [place], [address] Phone: [phone]   Fax:  [phone]  Name: Brennen Gardiner MRN: [id number]  Date of Birth: 16-Jun-1951

## 2020-11-15 ENCOUNTER — Other Ambulatory Visit: Payer: Self-pay

## 2020-11-15 ENCOUNTER — Encounter: Payer: Self-pay | Admitting: Physical Therapy

## 2020-11-15 ENCOUNTER — Ambulatory Visit: Payer: PPO | Admitting: Physical Therapy

## 2020-11-15 DIAGNOSIS — M25512 Pain in left shoulder: Secondary | ICD-10-CM

## 2020-11-15 DIAGNOSIS — R252 Cramp and spasm: Secondary | ICD-10-CM

## 2020-11-15 DIAGNOSIS — R293 Abnormal posture: Secondary | ICD-10-CM

## 2020-11-15 DIAGNOSIS — M25612 Stiffness of left shoulder, not elsewhere classified: Secondary | ICD-10-CM

## 2020-11-15 NOTE — Therapy (Signed)
[facility] [address], [address] [place], [place], [address] Phone: [phone]   Fax:  [phone]  Physical Therapy Treatment  Patient Details  Name: Priscilla Wilson MRN: [id number] Date of Birth: [birth year] Referring Provider (PT): Priscilla Deem, MD   Encounter Date: 11/15/2020   PT End of Session - 11/15/20 2100    Visit Number 6    Date for PT Re-Evaluation 01/09/21    Authorization Type [employer]    PT Start Time 1445    PT Stop Time 1525    PT Time Calculation (min) 40 min    Activity Tolerance Patient tolerated treatment well           History reviewed. No pertinent past medical history.  Past Surgical History:  Procedure Laterality Date  . BREAST EXCISIONAL BIOPSY Left    benign    There were no vitals filed for this visit.   Subjective Assessment - 11/15/20 1442    Subjective I'm so sore it hurts to move my arm across my body and out to the side.  Slept well Tuesday night and didn't wake up with pain.  Wednesday night not too bad.  I started to feel it this morning.    Pertinent History medications not up to date but unable to find with search: pt reports taking slow release calcium    Currently in Pain? Yes    Pain Score 6     Pain Location Shoulder    Pain Orientation Left    Pain Type Chronic pain                             [facility] Adult PT Treatment/Exercise - 11/15/20 0001      Shoulder Exercises: Supine   Protraction AROM;Left;10 reps    Other Supine Exercises at 90 degrees 12/6:00 and 9:00/3:00 10x each      Shoulder Exercises: Prone   Extension Both;10 reps;Strengthening    Horizontal ABduction 1 AROM;10 reps    Other Prone Exercises pendulums 10x    Other Prone Exercises prone row 10x      Moist Heat Therapy   Number Minutes Moist Heat 3 Minutes    Moist Heat Location Shoulder      Manual Therapy   Manual Therapy Taping    Joint Mobilization --    Soft tissue  mobilization Addaday instrument assisted to left upper trap, levator scap, deltoids triceps    Kinesiotex Facilitate Muscle      Kinesiotix   Facilitate Muscle  2 longitudinal strips and 1 horizontal strip deltoid unloading                    PT Short Term Goals - 11/13/20 1843      PT SHORT TERM GOAL #1   Title The patient will have a good understanding of postural correction for reduced pain    Status Achieved      PT SHORT TERM GOAL #2   Title The patient will report improved sleep by 25%    Status Achieved      PT SHORT TERM GOAL #3   Title .Marland KitchenMarland Kitchen             PT Long Term Goals - 10/17/20 2101      PT LONG TERM GOAL #1   Title The patient will be independent in  HEP    Time 12    Period Weeks  Status New    Target Date 01/09/21      PT LONG TERM GOAL #2   Title The patient will report a 50% improvement in sleep    Time 12    Period Weeks    Status New    Target Date 01/09/21      PT LONG TERM GOAL #3   Title FOTO < or = to 32% limited    Time 12    Period Weeks    Status New      PT LONG TERM GOAL #4   Title MMT Lt shoulder at least 4+/5 flexion and abduction for improved overhead tasks    Time 12    Period Weeks    Status New    Target Date 01/09/21      PT LONG TERM GOAL #5   Title Shoulder ROM equal bilaterally for reaching to overhead shelves    Time 12    Period Weeks    Status New    Target Date 01/09/21                 Plan - 11/15/20 2100    Clinical Impression Statement Treatment modified secondary to extreme shoulder soreness most likely attributed to DN 2 days ago.  Encouraged continued ROM in moderation to help with dissipation of soreness.  Some relief with instrument assisted soft tissue manual therapy,  Kinesiotape applied for pain relieft and support to be worn the next few days.    Personal Factors and Comorbidities Comorbidity 1;Time since onset of injury/illness/exacerbation    Examination-Activity Limitations  Carry;Lift;Reach Overhead;Bathing;Hygiene/Grooming;Sleep    Rehab Potential Excellent    PT Frequency 2x / week    PT Duration 12 weeks    PT Treatment/Interventions ADLs/Self Care Home Management;Cryotherapy;Electrical Stimulation;Iontophoresis 4mg /ml Dexamethasone;Moist Heat;Ultrasound;Therapeutic activities;Therapeutic exercise;Neuromuscular re-education;Patient/family education;Manual techniques;Passive range of motion;Dry needling;Taping    PT Next Visit Plan assess response to Addaday and KT;  glenohumeral joint mobs and capsular stretches;  shoulder and scapular strengthening    PT Home Exercise Plan [facility]           Patient will benefit from skilled therapeutic intervention in order to improve the following deficits and impairments:  Pain,Impaired perceived functional ability,Increased muscle spasms,Hypomobility,Postural dysfunction,Improper body mechanics,Decreased strength,Decreased range of motion,Increased fascial restricitons,Impaired UE functional use  Visit Diagnosis: Acute pain of left shoulder  Stiffness of left shoulder, not elsewhere classified  Abnormal posture  Cramp and spasm     Problem List There are no problems to display for this patient.  [facility], PT 11/15/20 9:05 PM Phone: [phone] Fax: [phone] 11/15/2020, 9:05 PM  [facility] [address], [address] [place], [place], [place] Phone: [phone]   Fax:  [phone]  Name: Priscilla Wilson MRN: [name] Date of Birth: May 13, 1951

## 2020-11-20 ENCOUNTER — Ambulatory Visit: Payer: PPO | Admitting: Physical Therapy

## 2020-11-20 ENCOUNTER — Other Ambulatory Visit: Payer: Self-pay

## 2020-11-20 DIAGNOSIS — M25512 Pain in left shoulder: Secondary | ICD-10-CM

## 2020-11-20 DIAGNOSIS — M25612 Stiffness of left shoulder, not elsewhere classified: Secondary | ICD-10-CM

## 2020-11-20 DIAGNOSIS — R293 Abnormal posture: Secondary | ICD-10-CM

## 2020-11-20 DIAGNOSIS — R252 Cramp and spasm: Secondary | ICD-10-CM

## 2020-11-20 NOTE — Therapy (Signed)
[facility] [address], [address] [place], [place], [address] Phone: [phone]   Fax:  [phone]  Physical Therapy Treatment  Patient Details  Name: Priscilla Wilson MRN: [id number] Date of Birth: 26-Dec-1950 Referring Provider (PT): Christena Deem, MD   Encounter Date: 11/20/2020   PT End of Session - 11/20/20 1403    Visit Number 7    Date for PT Re-Evaluation 01/09/21    Authorization Type [employer]    PT Start Time 1231    PT Stop Time 1314    PT Time Calculation (min) 43 min    Activity Tolerance Patient tolerated treatment well           No past medical history on file.  Past Surgical History:  Procedure Laterality Date  . BREAST EXCISIONAL BIOPSY Left    benign    There were no vitals filed for this visit.   Subjective Assessment - 11/20/20 1232    Subjective My shoulder is feeling better.  Still at night it wakes me.  I took the KT tape off it started to itch.    Pertinent History medications not up to date but unable to find with search: pt reports taking slow release calcium    Patient Stated Goals strengthen and reduce pain    Currently in Pain? Yes    Pain Score 2     Pain Location Shoulder    Pain Orientation Left    Pain Type Chronic pain                             [facility] Adult PT Treatment/Exercise - 11/20/20 0001      Shoulder Exercises: Standing   External Rotation Strengthening;Left;10 reps;Theraband    Theraband Level (Shoulder External Rotation) Level 3 (Green)    External Rotation Limitations small arcs only    Internal Rotation Strengthening;Left;5 reps    Theraband Level (Shoulder Internal Rotation) Level 3 (Green)    Internal Rotation Limitations small arcs of movement only    Extension Left;20 reps;Theraband    Theraband Level (Shoulder Extension) Level 3 (Green)    Other Standing Exercises light green loop wall clocks 5x right/left    Other Standing  Exercises green loop steering wheels      Shoulder Exercises: ROM/Strengthening   Other ROM/Strengthening Exercises wall push ups 10x    Other ROM/Strengthening Exercises green band triceps extension 15x      Manual Therapy   Soft tissue mobilization Addaday instrument assisted to left upper trap, levator scap, deltoids triceps                    PT Short Term Goals - 11/13/20 1843      PT SHORT TERM GOAL #1   Title The patient will have a good understanding of postural correction for reduced pain    Status Achieved      PT SHORT TERM GOAL #2   Title The patient will report improved sleep by 25%    Status Achieved      PT SHORT TERM GOAL #3   Title .Marland KitchenMarland Kitchen             PT Long Term Goals - 10/17/20 2101      PT LONG TERM GOAL #1   Title The patient will be independent in  HEP    Time 12    Period Weeks    Status New    Target  Date 01/09/21      PT LONG TERM GOAL #2   Title The patient will report a 50% improvement in sleep    Time 12    Period Weeks    Status New    Target Date 01/09/21      PT LONG TERM GOAL #3   Title FOTO < or = to 32% limited    Time 12    Period Weeks    Status New      PT LONG TERM GOAL #4   Title MMT Lt shoulder at least 4+/5 flexion and abduction for improved overhead tasks    Time 12    Period Weeks    Status New    Target Date 01/09/21      PT LONG TERM GOAL #5   Title Shoulder ROM equal bilaterally for reaching to overhead shelves    Time 12    Period Weeks    Status New    Target Date 01/09/21                 Plan - 11/20/20 1404    Clinical Impression Statement The patient reports decreased pain following exacerbation last week.  Encouraged ROM to promote healing/increased blood flow.  She reports muscular fatigue but no shoulder pain during treatment session.  Therapist monitoring response with all interventions.    Will focus on therapeutic exercise and hold on DN and kinesiotaping.     Examination-Activity Limitations Carry;Lift;Reach Overhead;Bathing;Hygiene/Grooming;Sleep    Rehab Potential Excellent    PT Frequency 2x / week    PT Duration 12 weeks    PT Treatment/Interventions ADLs/Self Care Home Management;Cryotherapy;Electrical Stimulation;Iontophoresis 4mg /ml Dexamethasone;Moist Heat;Ultrasound;Therapeutic activities;Therapeutic exercise;Neuromuscular re-education;Patient/family education;Manual techniques;Passive range of motion;Dry needling;Taping    PT Next Visit Plan Addaday to upper trap;  green band ex's; recheck ROM and progress toward goals; may decrease treatment frequency    PT Home Exercise Plan [facility]           Patient will benefit from skilled therapeutic intervention in order to improve the following deficits and impairments:  Pain,Impaired perceived functional ability,Increased muscle spasms,Hypomobility,Postural dysfunction,Improper body mechanics,Decreased strength,Decreased range of motion,Increased fascial restricitons,Impaired UE functional use  Visit Diagnosis: Acute pain of left shoulder  Stiffness of left shoulder, not elsewhere classified  Abnormal posture  Cramp and spasm     Problem List There are no problems to display for this patient.  [facility], PT 11/20/20 2:12 PM Phone: [phone] Fax: [phone] 11/20/2020, 01/18/2021 PM  [facility] [address], [address] [place], [place], [place] Phone: [phone]   Fax:  [phone]  Name: Priscilla Wilson MRN: [name] Date of Birth: 17-May-1951

## 2020-11-22 ENCOUNTER — Other Ambulatory Visit: Payer: Self-pay

## 2020-11-22 ENCOUNTER — Ambulatory Visit: Payer: PPO | Admitting: Physical Therapy

## 2020-11-22 DIAGNOSIS — M25512 Pain in left shoulder: Secondary | ICD-10-CM

## 2020-11-22 DIAGNOSIS — R252 Cramp and spasm: Secondary | ICD-10-CM

## 2020-11-22 DIAGNOSIS — M25612 Stiffness of left shoulder, not elsewhere classified: Secondary | ICD-10-CM

## 2020-11-22 DIAGNOSIS — R293 Abnormal posture: Secondary | ICD-10-CM

## 2020-11-22 NOTE — Patient Instructions (Signed)
Access Code: [email or web address] URL: [email or web address] Date: 11/22/2020 Prepared by: Lavinia Sharps  Exercises Standing Sidebending with Chair Support - 1 x daily - 7 x weekly - 3 sets - 10 reps Seated Thoracic Flexion and Rotation with Arms Crossed - 1 x daily - 7 x weekly - 10 reps - 1 sets - 5 sec hold Seated Thoracic Extension and Rotation with Reach - 1 x daily - 7 x weekly - 3 sets - 10 reps Shoulder Flexion Wall Slide with Towel - 1 x daily - 7 x weekly - 2 sets - 10 reps Thoracic Extension Mobilization with Noodle - 1 x daily - 7 x weekly - 1 sets - 10 reps Seated Thoracic Lumbar Extension with Pectoralis Stretch - 1 x daily - 7 x weekly - 1 sets - 10 reps Standing Single Arm Row with Resistance Thumb Up - 1 x daily - 7 x weekly - 1 sets - 10 reps Single Arm Shoulder Extension with Anchored Resistance - 1 x daily - 7 x weekly - 1 sets - 10 reps Standing Tricep Extensions with Resistance - 1 x daily - 7 x weekly - 1 sets - 10 reps Doorway Rhomboid Stretch - 1 x daily - 7 x weekly - 1 sets - 3 reps - 30 hold Seated Shoulder Inferior Glide - 1 x daily - 7 x weekly - 1 sets - 3 reps Bent Over Single Arm Shoulder Row with Dumbbell - 1 x daily - 7 x weekly - 1 sets - 10 reps Single Arm Bent Over Shoulder Extension with Dumbbell - 1 x daily - 7 x weekly - 2 sets - 10 reps Single Arm Bent Over Shoulder Horizontal Abduction with Dumbbell - Palm Down - 1 x daily - 7 x weekly - 2 sets - 10 reps Standing Shoulder Flexion to 90 Degrees with Dumbbells - 1 x daily - 7 x weekly - 1 sets - 10 reps Scaption with Dumbbells - 1 x daily - 7 x weekly - 1 sets - 10 reps Half-Kneeling Bottoms Up [employer] - 1 x daily - 7 x weekly - 1 sets - 10 reps  Patient Education Office Posture`

## 2020-11-22 NOTE — Therapy (Addendum)
[facility] [address], [place], [place], [address] Phone: [phone]   Fax:  [phone]  Physical Therapy Treatment/Discharge Summary   Patient Details  Name: Priscilla Wilson MRN: [id number] Date of Birth: [birth year] Referring Provider (PT): Inez Catalina, MD   Encounter Date: 11/22/2020   PT End of Session - 11/22/20 1243    Visit Number 8    Date for PT Re-Evaluation 01/09/21    Authorization Type [employer]    PT Start Time 1233    PT Stop Time 1312    PT Time Calculation (min) 39 min    Equipment Utilized During Treatment Back brace    Activity Tolerance Patient tolerated treatment well    Behavior During Therapy [facility] for tasks assessed/performed           No past medical history on file.  Past Surgical History:  Procedure Laterality Date  . BREAST EXCISIONAL BIOPSY Left    benign    There were no vitals filed for this visit.   Subjective Assessment - 11/22/20 1237    Subjective Doing well today.  Overall the exercises are going well.  Minimal upper trap pain.  Sleeping much better.    Pertinent History medications not up to date but unable to find with search: pt reports taking slow release calcium    Currently in Pain? No/denies    Pain Score 0-No pain    Pain Location Arm    Pain Orientation Left              [facility] PT Assessment - 11/22/20 0001      Observation/Other Assessments   Focus on Therapeutic Outcomes (FOTO)  improved from 56% Stage 3 to 84% Stage 5      AROM   Left Shoulder Flexion 173 Degrees   end of session   Left Shoulder ABduction 170 Degrees   mild pain     Strength   Right Shoulder Flexion 4/5    Left Shoulder Flexion 4+/5    Left Shoulder ABduction 4/5    Left Shoulder Internal Rotation 4+/5    Left Shoulder External Rotation 4+/5                         [facility] Adult PT Treatment/Exercise - 11/22/20 0001      Shoulder Exercises: Prone    Other Prone Exercises kneeling 5# kettlebell rows 10x    Other Prone Exercises left shoulder extensions 3# 10x; horizontal abduction 3# 10x      Shoulder Exercises: Standing   Shoulder Elevation Limitations 5# kettlebell snatch and ovrehead press 3x    Other Standing Exercises 1# shoulder flexion 10x mirror feedback    Other Standing Exercises 1# shoulder scaption with mirror feedback 10x      Shoulder Exercises: ROM/Strengthening   Other ROM/Strengthening Exercises discussion of HEP self progression                  PT Education - 11/22/20 1607    Education Details kneeling I T Y; 1# standing 3 ways; 5# kettlebell snatch and press    Person(s) Educated Patient    Methods Explanation;Demonstration;Handout    Comprehension Returned demonstration;Verbalized understanding            PT Short Term Goals - 11/22/20 1613      PT SHORT TERM GOAL #1   Title The patient will have a good understanding of postural correction for reduced pain  Status Achieved      PT SHORT TERM GOAL #2   Title The patient will report improved sleep by 25%    Status Achieved             PT Long Term Goals - 11/22/20 1613      PT LONG TERM GOAL #1   Title The patient will be independent in  HEP    Time 12    Period Weeks    Status Partially Met      PT LONG TERM GOAL #2   Title The patient will report a 50% improvement in sleep    Status Achieved      PT LONG TERM GOAL #3   Title FOTO < or = to 32% limited    Status Achieved      PT LONG TERM GOAL #4   Title MMT Lt shoulder at least 4+/5 flexion and abduction for improved overhead tasks    Status Partially Met      PT LONG TERM GOAL #5   Title Shoulder ROM equal bilaterally for reaching to overhead shelves    Status Achieved                 Plan - 11/22/20 1254    Clinical Impression Statement The patient demonstrates significant improvements in shoulder ROM to WNLs in all planes.  Mild discomfort with abduction  only.  Her shoulder strength is grossly 4+/5 except abduction 4/5.  Her FOTO functional outcome score was initially 56% placing her in Stage 3.  She is now 73 placing the patient in Stage 5 and means patient has excellent shoulder function.  She plans to continue with her HEP independently but would like to keep her PT chart open for the remainder of the certification period in case issues arise.  Treatment session focus on strengthening progression over the next month and appropriate resistance.    Personal Factors and Comorbidities Comorbidity 1;Time since onset of injury/illness/exacerbation    Examination-Activity Limitations Carry;Lift;Reach Overhead;Bathing;Hygiene/Grooming;Sleep    Rehab Potential Excellent    PT Frequency 2x / week    PT Duration 12 weeks    PT Treatment/Interventions ADLs/Self Care Home Management;Cryotherapy;Electrical Stimulation;Iontophoresis 4mg /ml Dexamethasone;Moist Heat;Ultrasound;Therapeutic activities;Therapeutic exercise;Neuromuscular re-education;Patient/family education;Manual techniques;Passive range of motion;Dry needling;Taping    PT Next Visit Plan pt to do HEP; if she has not called to schedule a follow up by early March will discharge from [facility]           Patient will benefit from skilled therapeutic intervention in order to improve the following deficits and impairments:  Pain,Impaired perceived functional ability,Increased muscle spasms,Hypomobility,Postural dysfunction,Improper body mechanics,Decreased strength,Decreased range of motion,Increased fascial restricitons,Impaired UE functional use  Visit Diagnosis: Acute pain of left shoulder  Stiffness of left shoulder, not elsewhere classified  Abnormal posture  Cramp and spasm  PHYSICAL THERAPY DISCHARGE SUMMARY  Visits from Start of Care: 8  Current functional level related to goals / functional outcomes: On the last visit, the patient was doing very well.  We  discussed that she would continue to do her HEP on her own and call for follow up in early March if issues arose.  She has not called to resume and will therefore discharge from PT at this time.     Remaining deficits: As above   Education / Equipment: HEP Plan: Patient agrees to discharge.  Patient goals were partially met. Patient is being discharged due to meeting the  stated rehab goals.  ?????       Problem List There are no problems to display for this patient.  Ruben Im, PT 11/22/20 4:14 PM Phone: [phone] Fax: [phone] Alvera Singh 11/22/2020, 4:14 PM  [facility] [address], [place], [place], [address] Phone: [phone]   Fax:  [phone]  Name: Priscilla Wilson MRN: [id number] Date of Birth: 10-13-1951

## 2021-01-08 DIAGNOSIS — M19031 Primary osteoarthritis, right wrist: Secondary | ICD-10-CM | POA: Diagnosis not present

## 2021-01-08 DIAGNOSIS — M1811 Unilateral primary osteoarthritis of first carpometacarpal joint, right hand: Secondary | ICD-10-CM | POA: Diagnosis not present

## 2021-01-08 DIAGNOSIS — J209 Acute bronchitis, unspecified: Secondary | ICD-10-CM | POA: Diagnosis not present

## 2021-03-04 DIAGNOSIS — Z780 Asymptomatic menopausal state: Secondary | ICD-10-CM | POA: Diagnosis not present

## 2021-03-20 DIAGNOSIS — R5383 Other fatigue: Secondary | ICD-10-CM | POA: Diagnosis not present

## 2021-03-20 DIAGNOSIS — Z6827 Body mass index (BMI) 27.0-27.9, adult: Secondary | ICD-10-CM | POA: Diagnosis not present

## 2021-03-20 DIAGNOSIS — M0579 Rheumatoid arthritis with rheumatoid factor of multiple sites without organ or systems involvement: Secondary | ICD-10-CM | POA: Diagnosis not present

## 2021-03-20 DIAGNOSIS — E663 Overweight: Secondary | ICD-10-CM | POA: Diagnosis not present

## 2021-03-20 DIAGNOSIS — M2550 Pain in unspecified joint: Secondary | ICD-10-CM | POA: Diagnosis not present

## 2021-03-20 DIAGNOSIS — Z1589 Genetic susceptibility to other disease: Secondary | ICD-10-CM | POA: Diagnosis not present

## 2021-03-20 DIAGNOSIS — M150 Primary generalized (osteo)arthritis: Secondary | ICD-10-CM | POA: Diagnosis not present

## 2021-03-27 DIAGNOSIS — Z0184 Encounter for antibody response examination: Secondary | ICD-10-CM | POA: Diagnosis not present

## 2021-04-29 DIAGNOSIS — J45909 Unspecified asthma, uncomplicated: Secondary | ICD-10-CM | POA: Diagnosis not present

## 2021-05-31 ENCOUNTER — Ambulatory Visit
Admission: RE | Admit: 2021-05-31 | Discharge: 2021-05-31 | Disposition: A | Discharge status: Home / Self Care | Payer: PPO | Source: Ambulatory Visit | Attending: Family Medicine | Admitting: Family Medicine

## 2021-05-31 ENCOUNTER — Other Ambulatory Visit: Payer: Self-pay | Admitting: Family Medicine

## 2021-05-31 DIAGNOSIS — M5416 Radiculopathy, lumbar region: Secondary | ICD-10-CM

## 2021-05-31 DIAGNOSIS — M4696 Unspecified inflammatory spondylopathy, lumbar region: Secondary | ICD-10-CM | POA: Diagnosis not present

## 2021-05-31 DIAGNOSIS — M4726 Other spondylosis with radiculopathy, lumbar region: Secondary | ICD-10-CM | POA: Diagnosis not present

## 2021-05-31 DIAGNOSIS — M4317 Spondylolisthesis, lumbosacral region: Secondary | ICD-10-CM | POA: Diagnosis not present

## 2021-05-31 DIAGNOSIS — M25552 Pain in left hip: Secondary | ICD-10-CM

## 2021-05-31 DIAGNOSIS — M47816 Spondylosis without myelopathy or radiculopathy, lumbar region: Secondary | ICD-10-CM | POA: Diagnosis not present

## 2021-06-06 DIAGNOSIS — Z1589 Genetic susceptibility to other disease: Secondary | ICD-10-CM | POA: Diagnosis not present

## 2021-06-06 DIAGNOSIS — E663 Overweight: Secondary | ICD-10-CM | POA: Diagnosis not present

## 2021-06-06 DIAGNOSIS — M150 Primary generalized (osteo)arthritis: Secondary | ICD-10-CM | POA: Diagnosis not present

## 2021-06-06 DIAGNOSIS — M2550 Pain in unspecified joint: Secondary | ICD-10-CM | POA: Diagnosis not present

## 2021-06-06 DIAGNOSIS — M0579 Rheumatoid arthritis with rheumatoid factor of multiple sites without organ or systems involvement: Secondary | ICD-10-CM | POA: Diagnosis not present

## 2021-06-06 DIAGNOSIS — Z6826 Body mass index (BMI) 26.0-26.9, adult: Secondary | ICD-10-CM | POA: Diagnosis not present

## 2021-07-02 ENCOUNTER — Encounter: Payer: Self-pay | Admitting: Allergy & Immunology

## 2021-07-02 ENCOUNTER — Other Ambulatory Visit: Payer: Self-pay

## 2021-07-02 ENCOUNTER — Ambulatory Visit: Payer: PPO | Admitting: Allergy & Immunology

## 2021-07-02 VITALS — BP 128/82 | HR 87 | Temp 98.2°F | Resp 14 | Ht 60.0 in | Wt 141.6 lb

## 2021-07-02 DIAGNOSIS — J4530 Mild persistent asthma, uncomplicated: Secondary | ICD-10-CM

## 2021-07-02 DIAGNOSIS — J3089 Other allergic rhinitis: Secondary | ICD-10-CM

## 2021-07-02 DIAGNOSIS — T7800XD Anaphylactic reaction due to unspecified food, subsequent encounter: Secondary | ICD-10-CM | POA: Diagnosis not present

## 2021-07-02 MED ORDER — ALBUTEROL SULFATE HFA 108 (90 BASE) MCG/ACT IN AERS: 4.0000 | INHALATION_SPRAY | RESPIRATORY_TRACT | 1 refills | Status: DC | PRN

## 2021-07-02 MED ORDER — FLUTICASONE PROPIONATE HFA 220 MCG/ACT IN AERO: 1.0000 | INHALATION_SPRAY | Freq: Two times a day (BID) | RESPIRATORY_TRACT | 5 refills | Status: DC

## 2021-07-02 MED ORDER — CETIRIZINE HCL 10 MG PO TABS
10.0000 mg | ORAL_TABLET | Freq: Every day | ORAL | 5 refills | Status: DC
Start: 2021-07-02 — End: 2023-06-30

## 2021-07-02 NOTE — Progress Notes (Signed)
NEW PATIENT  Date of Service/Encounter:  07/02/21  Consult requested by: Sigmund Hazel, MD   Assessment:   Mild persistent asthma, uncomplicated  Perennial allergic rhinitis (dust mites, cat  Anaphylactic shock due to food (shellfish) - confirming with blood work  Plan/Recommendations:   1. Mild persistent asthma, uncomplicated - Lung testing looked decent today, but it did improve with the albuterol treatment.  - We did attempt to send in Flovent 220 mcg 1 puff twice daily, but it turned out to be more expensive than the Flovent 110 mcg. - Therefore, we are just going to stick with the Flovent 110 mcg. - Spacer demonstration provided. - Daily controller medication(s): Flovent 2 puffs twice daily with spacer - Prior to physical activity: albuterol 2 puffs 10-15 minutes before physical activity. - Rescue medications: albuterol 4 puffs every 4-6 hours as needed - Changes during respiratory infections or worsening symptoms: Increase Flovent to 4 puffs twice daily for TWO WEEKS. - Asthma control goals:  * Full participation in all desired activities (may need albuterol before activity) * Albuterol use two time or less a week on average (not counting use with activity) * Cough interfering with sleep two time or less a month * Oral steroids no more than once a year * No hospitalizations  2. Chronic rhinitis - Testing today showed: dust mites and cat - Copy of test results provided.  - Avoidance measures provided. - Continue with: Zyrtec (cetirizine) 10mg  tablet once daily (or another antihistamine) - Start taking (AT THE FIRST SIGN OF A SINUS INFECTION): Flonase (fluticasone) one spray per nostril daily and Astelin (azelastine) 2 sprays per nostril 1-2 times daily FOR ONE TO TWO WEEKS (to prevent antibiotics and prednisone) - You can use an extra dose of the antihistamine, if needed, for breakthrough symptoms.  - Consider nasal saline rinses 1-2 times daily to  remove allergens from the nasal cavities as well as help with mucous clearance (this is especially helpful to do before the nasal sprays are given) - Consider allergy shots as a means of long-term control if needed.   3. Shellfish allergy - Testing was negative. - We can do blood work to confirm this and introduce them into your diet. - But I am fine with avoidance as well.  - Patient declined an EpiPen.  4. Return in about 3 months (around 10/02/2021).    This note in its entirety was forwarded to the Provider who requested this consultation.  Subjective:   Priscilla Wilson is a 70 y.o. female presenting today for evaluation of  Chief Complaint  Patient presents with   Asthma    States that she has a seasonal asthma flare. Every quarter she is given prednisone as well as an inhaler. States she belies she may be allergic to something that causes her have a breathing flare up.   Allergic Rhinitis     May have an allergy to something    Priscilla Wilson has a history of the following: There are no problems to display for this patient.   History obtained from: chart review and patient.  [name] was referred by [name], MD.     Priscilla Wilson is a 70 y.o. female presenting for an evaluation of allergies and asthma .   Asthma/Respiratory Symptom History: She has "seasonal asthma" which is treated with antibiotics and prednisone. This seems to happen every 3 months since around 2019. She is tired of having to do this every 3 months. She moved  here from [place] years ago and had no problems there. She might have had some issues occasionally but this is more regular here.  She was last placed on Flovent which cleared it up. She continued to decline antibiotics and prednisone.   Allergic Rhinitis Symptom History: She does not remember having issues from allergies in [place]. She tends to get used to a certain amount of suffering but this is not something that was taking her to the doctor for  treatment. She has never been tested in the past.   Food Allergy Symptom History: She does report a throat swelling with exposure to shellfish.  This is especially notable with snails as well as crab.  She just avoids all shellfish just to be on the safe side.  Otherwise, there is no history of other atopic diseases, including drug allergies, stinging insect allergies, eczema, urticaria, or contact dermatitis. There is no significant infectious history. Vaccinations are up to date.    Past Medical History: There are no problems to display for this patient.   Medication List:  Allergies as of 07/02/2021       Reactions   Sulfa Antibiotics Rash        Medication List        Accurate as of July 02, 2021 10:12 AM. If you have any questions, ask your nurse or doctor.          STOP taking these medications    acetaminophen 325 MG tablet Commonly known as: TYLENOL Stopped by: Alfonse SpruceJoel Louis Poseidon Pam, MD       TAKE these medications    albuterol 108 (90 Base) MCG/ACT inhaler Commonly known as: VENTOLIN HFA Inhale 4 puffs into the lungs every 4 (four) hours as needed for wheezing or shortness of breath. What changed:  how much to take when to take this reasons to take this Changed by: Alfonse SpruceJoel Louis Elmire Amrein, MD   Biotin 10 MG Tabs Take 1,000 mcg by mouth daily.   Calcium Citrate-Vitamin D 315-250 MG-UNIT Tabs 1 tablet   calcium-vitamin D 250-125 MG-UNIT tablet Commonly known as: OSCAL WITH D Take 2 tablets by mouth daily.   cetirizine 10 MG tablet Commonly known as: ZYRTEC Take 1 tablet (10 mg total) by mouth daily. Started by: Alfonse SpruceJoel Louis Breanna Mcdaniel, MD   Flovent HFA [phone] MCG/ACT inhaler Generic drug: fluticasone Inhale into the lungs. What changed: Another medication with the same name was added. Make sure you understand how and when to take each. Changed by: Alfonse SpruceJoel Louis Jacquez Sheetz, MD   fluticasone 220 MCG/ACT inhaler Commonly known as: Flovent HFA Inhale 1  puff into the lungs 2 (two) times daily. What changed: You were already taking a medication with the same name, and this prescription was added. Make sure you understand how and when to take each. Changed by: Alfonse SpruceJoel Louis Jasmynn Pfalzgraf, MD   ibuprofen 800 MG tablet Commonly known as: ADVIL Take 800 mg by mouth 3 (three) times daily.   Multi Vitamin Tabs 1 tablet   simvastatin 10 MG tablet Commonly known as: ZOCOR Take 10 mg by mouth daily.   Turmeric 500 MG Caps See admin instructions.   venlafaxine XR 75 MG 24 hr capsule Commonly known as: EFFEXOR-XR 1 capsule with food   venlafaxine XR 75 MG 24 hr capsule Commonly known as: EFFEXOR-XR Take 75 mg by mouth daily.        Birth History: non-contributory  Developmental History: non-contributory  Past Surgical History: Past Surgical History:  Procedure Laterality Date  BREAST EXCISIONAL BIOPSY Left    benign     Family History: No family history on file.   Social History: Lyndsie lives at home with her family.  She lives in a house that is 72 years old.  There is carpeting throughout the home.  She has gas heating and central cooling.  There are no indoor animals.  There are just outdoor wildlife, but no domesticated animals.  She has no dust mite covers on the bedding.  There is no tobacco exposure.  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  She does not live near an interstate or industrial area.  He is not exposed to fumes, chemicals, or dust.   Review of Systems  Constitutional: Negative.  Negative for chills, fever, malaise/fatigue and weight loss.  HENT:  Positive for congestion. Negative for ear discharge and ear pain.        Positive for postnasal drip.  Eyes:  Negative for pain, discharge and redness.  Respiratory:  Positive for cough. Negative for sputum production, shortness of breath and wheezing.    Cardiovascular: Negative.  Negative for chest pain and palpitations.  Gastrointestinal:  Negative for abdominal pain, heartburn, nausea and vomiting.  Skin: Negative.  Negative for itching and rash.  Neurological:  Negative for dizziness and headaches.  Endo/Heme/Allergies:  Negative for environmental allergies. Does not bruise/bleed easily.      Objective:   Blood pressure 128/82, pulse 87, temperature 98.2 F (36.8 C), temperature source Temporal, resp. rate 14, height 5' (1.524 m), weight 141 lb 9.6 oz (64.2 kg), SpO2 95 %. Body mass index is 27.65 kg/m.   Physical Exam:   Physical Exam Constitutional:      Appearance: She is well-developed.  HENT:     Head: Normocephalic and atraumatic.     Right Ear: Tympanic membrane, ear canal and external ear normal. No drainage, swelling or tenderness. Tympanic membrane is not injected, scarred, erythematous, retracted or bulging.     Left Ear: Tympanic membrane, ear canal and external ear normal. No drainage, swelling or tenderness. Tympanic membrane is not injected, scarred, erythematous, retracted or bulging.     Nose: No nasal deformity, septal deviation, mucosal edema or rhinorrhea.     Right Turbinates: Enlarged, swollen and pale.     Left Turbinates: Enlarged, swollen and pale.     Right Sinus: No maxillary sinus tenderness or frontal sinus tenderness.     Left Sinus: No maxillary sinus tenderness or frontal sinus tenderness.     Comments: No nasal polyps.    Mouth/Throat:     Mouth: Mucous membranes are not pale and not dry.     Pharynx: Uvula midline.  Eyes:     General:        Right eye: No discharge.        Left eye: No discharge.     Conjunctiva/sclera: Conjunctivae normal.     Right eye: Right conjunctiva is not injected. No chemosis.    Left eye: Left conjunctiva is not injected. No chemosis.    Pupils: Pupils are equal, round, and reactive to light.  Cardiovascular:     Rate and Rhythm: Normal rate and regular  rhythm.     Heart sounds: Normal heart sounds.  Pulmonary:     Effort: Pulmonary effort is normal. No tachypnea, accessory muscle usage or respiratory distress.     Breath sounds: Normal  breath sounds. No wheezing, rhonchi or rales.  Chest:     Chest wall: No tenderness.  Abdominal:     Tenderness: There is no abdominal tenderness. There is no guarding or rebound.  Lymphadenopathy:     Head:     Right side of head: No submandibular, tonsillar or occipital adenopathy.     Left side of head: No submandibular, tonsillar or occipital adenopathy.     Cervical: No cervical adenopathy.  Skin:    Coloration: Skin is not pale.     Findings: No abrasion, erythema, petechiae or rash. Rash is not papular, urticarial or vesicular.  Neurological:     Mental Status: She is alert.     Diagnostic studies:    Spirometry: results normal (FEV1: 1.45/80%, FVC: 1.84/76%, FEV1/FVC: 79%).    Spirometry consistent with possible restrictive disease. Albuterol four puffs via MDI treatment given in clinic with improvement in FEV1 and FVC, but not significant per ATS criteria.  She did feel symptomatically better following the albuterol treatment.  Allergy Studies:     Airborne Adult Perc - 07/02/21 0903     Time Antigen Placed 1610    Allergen Manufacturer Waynette Buttery    Location Back    Number of Test 59    1. Control-Buffer 50% Glycerol Negative    2. Control-Histamine 1 mg/ml 2+    3. Albumin saline Negative    4. Bahia Negative    5. French Southern Territories Negative    6. Johnson Negative    7. Kentucky Blue Negative    8. Meadow Fescue Negative    9. Perennial Rye Negative    10. Sweet Vernal Negative    11. Timothy Negative    12. Cocklebur Negative    13. Burweed Marshelder Negative    14. Ragweed, short Negative    15. Ragweed, Giant Negative    16. Plantain,  English Negative    17. Lamb's Quarters Negative    18. Sheep Sorrell Negative    19. Rough Pigweed Negative    20. Marsh Elder, Rough Negative     21. Mugwort, Common Negative    22. Ash mix Negative    23. Birch mix Negative    24. Beech American Negative    25. Box, Elder Negative    26. Cedar, red Negative    27. Cottonwood, Guinea-Bissau Negative    28. Elm mix Negative    29. Hickory Negative    30. Maple mix Negative    31. Oak, Guinea-Bissau mix Negative    32. Pecan Pollen Negative    33. Pine mix Negative    34. Sycamore Eastern Negative    35. Walnut, Black Pollen Negative    36. Alternaria alternata Negative    37. Cladosporium Herbarum Negative    38. Aspergillus mix Negative    39. Penicillium mix Negative    40. Bipolaris sorokiniana (Helminthosporium) Negative    41. Drechslera spicifera (Curvularia) Negative    42. Mucor plumbeus Negative    43. Fusarium moniliforme Negative    44. Aureobasidium pullulans (pullulara) Negative    45. Rhizopus oryzae Negative    46. Botrytis cinera Negative    47. Epicoccum nigrum Negative    48. Phoma betae Negative    49. Candida Albicans Negative    50. Trichophyton mentagrophytes Negative    51. Mite, D Farinae  5,000 AU/ml 2+    52. Mite, D Pteronyssinus  5,000 AU/ml 2+    53. Cat Hair 10,000 BAU/ml Negative  54.  Dog Epithelia Negative    55. Mixed Feathers Negative    56. Horse Epithelia Negative    57. Cockroach, German Negative    58. Mouse Negative    59. Tobacco Leaf Negative             Intradermal - 07/02/21 0921     Time Antigen Placed 0930    Allergen Manufacturer Waynette Buttery    Location Back    Number of Test 14    Intradermal Select    Control Negative    French Southern Territories Negative    Johnson Negative    7 Grass Negative    Ragweed mix Negative    Weed mix Negative    Tree mix Negative    Mold 1 Negative    Mold 2 Negative    Mold 3 Negative    Mold 4 Negative    Cat 2+    Dog Negative    Cockroach Negative             Food Adult Perc - 07/02/21 0900     Time Antigen Placed 0938    Allergen Manufacturer Waynette Buttery    Location Back    Number of  allergen test 8    Control-Histamine 1 mg/ml 2+    8. Shellfish Mix Negative    9. Fish Mix Negative    25. Shrimp Negative    26. Crab Negative    27. Lobster Negative    28. Oyster Negative    29. Scallops Negative             Allergy testing results were read and interpreted by myself, documented by clinical staff.         Malachi Bonds, MD [facility]

## 2021-07-02 NOTE — Patient Instructions (Addendum)
1. Mild persistent asthma, uncomplicated - Lung testing looked decent today, but it did improve with the albuterol treatment.  - I think you need to start a daily controller medication: Flovent one puff twice daily (this will allow a co-pay to last 2 months instead of 1 month). - Spacer demonstration provided. - Daily controller medication(s): Flovent 1 puff twice daily with spacer - Prior to physical activity: albuterol 2 puffs 10-15 minutes before physical activity. - Rescue medications: albuterol 4 puffs every 4-6 hours as needed - Changes during respiratory infections or worsening symptoms: Increase Flovent to 2 puffs twice daily for TWO WEEKS. - Asthma control goals:  * Full participation in all desired activities (may need albuterol before activity) * Albuterol use two time or less a week on average (not counting use with activity) * Cough interfering with sleep two time or less a month * Oral steroids no more than once a year * No hospitalizations  2. Chronic rhinitis - Testing today showed: dust mites and cat - Copy of test results provided.  - Avoidance measures provided. - Continue with: Zyrtec (cetirizine) 10mg  tablet once daily (or another antihistamine) - Start taking (AT THE FIRST SIGN OF A SINUS INFECTION): Flonase (fluticasone) one spray per nostril daily and Astelin (azelastine) 2 sprays per nostril 1-2 times daily FOR ONE TO TWO WEEKS (to prevent antibiotics and prednisone) - You can use an extra dose of the antihistamine, if needed, for breakthrough symptoms.  - Consider nasal saline rinses 1-2 times daily to remove allergens from the nasal cavities as well as help with mucous clearance (this is especially helpful to do before the nasal sprays are given) - Consider allergy shots as a means of long-term control if needed.   3. Shellfish allergy - Testing was negative. - We can do blood work to confirm this and introduce them into your diet. - But I  am fine with avoidance as well.   4. Return in about 3 months (around 10/02/2021).    Please inform 10/04/2021 of any Emergency Department visits, hospitalizations, or changes in symptoms. Call us before going to the ED for breathing or allergy symptoms since we might be able to fit you in for a sick visit. Feel free to contact us anytime with any questions, problems, or concerns.  It was a pleasure to meet you today!  Websites that have reliable patient information: 1. American Academy of Asthma, Allergy, and Immunology: www.aaaai.org 2. Food Allergy Research and Education (FARE): foodallergy.org 3. Mothers of Asthmatics: http://www.asthmacommunitynetwork.org 4. American College of Allergy, Asthma, and Immunology: www.acaai.org   COVID-19 Vaccine Information can be found at: [place] For questions related to vaccine distribution or appointments, please email [email or web address] or call [phone].   We realize that you might be concerned about having an allergic reaction to the COVID19 vaccines. To help with that concern, WE ARE OFFERING THE COVID19 VACCINES IN OUR OFFICE! Ask the front desk for dates!     "Like" [phone] on Facebook and Instagram for our latest updates!      A healthy democracy works best when [place] participate! Make sure you are registered to vote! If you have moved or changed any of your contact information, you will need to get this updated before voting!  In some cases, you MAY be able to register to vote online: [employer]    8. Shellfish Mix Negative   9. Fish Mix Negative   25. Shrimp Negative   26. Crab Negative  27. Lobster Negative   28. Oyster Negative   29. Scallops Negative    Intradermal Select   Control Negative   [place] Negative   Johnson Negative   7 Grass Negative   Ragweed mix Negative   Weed mix Negative   Tree mix Negative    Mold 1 Negative   Mold 2 Negative   Mold 3 Negative   Mold 4 Negative   Cat 2+   Dog Negative   Cockroach Negative     1. Control-Buffer 50% Glycerol Negative   2. Control-Histamine 1 mg/ml 2+   3. Albumin saline Negative   4. Bahia Negative   5. [place] Negative   6. Johnson Negative   7. Kentucky Blue Negative   8. Meadow Fescue Negative   9. Perennial Rye Negative   10. Sweet Vernal Negative   11. Timothy Negative   12. Cocklebur Negative   13. Burweed Marshelder Negative   14. Ragweed, short Negative   15. Ragweed, Giant Negative   16. Plantain,  English Negative   17. Lamb's Quarters Negative   18. Sheep Sorrell Negative   19. Rough Pigweed Negative   20. Marsh Elder, Rough Negative   21. Mugwort, Common Negative   22. Ash mix Negative   23. Birch mix Negative   24. Beech American Negative   25. Box, Elder Negative   26. Cedar, red Negative   27. Cottonwood, Guinea[place] Negative   28. Elm mix Negative   29. Hickory Negative   30. Maple mix Negative   31. Oak, [place] mix Negative   32. Pecan Pollen Negative   33. Pine mix Negative   34. Sycamore Eastern Negative   35. Walnut, Black Pollen Negative   36. Alternaria alternata Negative   37. Cladosporium Herbarum Negative   38. Aspergillus mix Negative   39. Penicillium mix Negative   40. Bipolaris sorokiniana (Helminthosporium) Negative   41. Drechslera spicifera (Curvularia) Negative   42. Mucor plumbeus Negative   43. Fusarium moniliforme Negative   44. Aureobasidium pullulans (pullulara) Negative   45. Rhizopus oryzae Negative   46. Botrytis cinera Negative   47. Epicoccum nigrum Negative   48. Phoma betae Negative   49. Candida Albicans Negative   50. Trichophyton mentagrophytes Negative   51. Mite, D Farinae  5,000 AU/ml 2+   52. Mite, D Pteronyssinus  5,000 AU/ml 2+   53. Cat Hair 10,000 BAU/ml Negative   54.  Dog Epithelia Negative   55. Mixed Feathers Negative   56. Horse Epithelia Negative    57. Cockroach, German Negative   58. Mouse Negative   59. Tobacco Leaf Negative      Control of Dust Mite Allergen    Dust mites play a major role in allergic asthma and rhinitis.  They occur in environments with high humidity wherever human skin is found.  Dust mites absorb humidity from the atmosphere (ie, they do not drink) and feed on organic matter (including shed human and animal skin).  Dust mites are a microscopic type of insect that you cannot see with the naked eye.  High levels of dust mites have been detected from mattresses, pillows, carpets, upholstered furniture, bed covers, clothes, soft toys and any woven material.  The principal allergen of the dust mite is found in its feces.  A gram of dust may contain 1,000 mites and 250,000 fecal particles.  Mite antigen is easily measured in the air during house cleaning activities.  Dust mites do not bite  and do not cause harm to humans, other than by triggering allergies/asthma.    Ways to decrease your exposure to dust mites in your home:  Encase mattresses, box springs and pillows with a mite-impermeable barrier or cover   Wash sheets, blankets and drapes weekly in hot water (130 F) with detergent and dry them in a dryer on the hot setting.  Have the room cleaned frequently with a vacuum cleaner and a damp dust-mop.  For carpeting or rugs, vacuuming with a vacuum cleaner equipped with a high-efficiency particulate air (HEPA) filter.  The dust mite allergic individual should not be in a room which is being cleaned and should wait 1 hour after cleaning before going into the room. Do not sleep on upholstered furniture (eg, couches).   If possible removing carpeting, upholstered furniture and drapery from the home is ideal.  Horizontal blinds should be eliminated in the rooms where the person spends the most time (bedroom, study, television room).  Washable vinyl, roller-type shades are optimal. Remove all non-washable stuffed toys from  the bedroom.  Wash stuffed toys weekly like sheets and blankets above.   Reduce indoor humidity to less than 50%.  Inexpensive humidity monitors can be purchased at most hardware stores.  Do not use a humidifier as can make the problem worse and are not recommended.  Control of Dog or Cat Allergen  Avoidance is the best way to manage a dog or cat allergy. If you have a dog or cat and are allergic to dog or cats, consider removing the dog or cat from the home. If you have a dog or cat but don't want to find it a new home, or if your family wants a pet even though someone in the household is allergic, here are some strategies that may help keep symptoms at bay:  Keep the pet out of your bedroom and restrict it to only a few rooms. Be advised that keeping the dog or cat in only one room will not limit the allergens to that room. Don't pet, hug or kiss the dog or cat; if you do, wash your hands with soap and water. High-efficiency particulate air (HEPA) cleaners run continuously in a bedroom or living room can reduce allergen levels over time. Regular use of a high-efficiency vacuum cleaner or a central vacuum can reduce allergen levels. Giving your dog or cat a bath at least once a week can reduce airborne allergen.

## 2021-07-06 LAB — ALLERGY PANEL 19, SEAFOOD GROUP
Allergen Salmon IgE: <0.1 kU/L
Catfish: <0.1 kU/L
Codfish IgE: <0.1 kU/L
F023-IgE Crab: 0.13 kU/L — AB
F080-IgE Lobster: 0.1 kU/L — AB
Shrimp IgE: 0.2 kU/L — AB
Tuna: <0.1 kU/L

## 2021-07-10 ENCOUNTER — Telehealth: Payer: Self-pay

## 2021-07-11 MED ORDER — BUDESONIDE-FORMOTEROL FUMARATE 160-4.5 MCG/ACT IN AERO: 2.0000 | INHALATION_SPRAY | Freq: Two times a day (BID) | RESPIRATORY_TRACT | 5 refills | Status: DC

## 2021-07-11 NOTE — Addendum Note (Signed)
Addended by: Robet Leu A on: 07/11/2021 10:41 AM   Modules accepted: Orders

## 2021-07-12 NOTE — Telephone Encounter (Signed)
Message is in lab results

## 2021-07-12 NOTE — Telephone Encounter (Deleted)
Error- please disregard

## 2021-07-16 DIAGNOSIS — H5203 Hypermetropia, bilateral: Secondary | ICD-10-CM | POA: Diagnosis not present

## 2021-07-16 DIAGNOSIS — H2513 Age-related nuclear cataract, bilateral: Secondary | ICD-10-CM | POA: Diagnosis not present

## 2021-08-23 DIAGNOSIS — Z1389 Encounter for screening for other disorder: Secondary | ICD-10-CM | POA: Diagnosis not present

## 2021-08-23 DIAGNOSIS — Z23 Encounter for immunization: Secondary | ICD-10-CM | POA: Diagnosis not present

## 2021-08-23 DIAGNOSIS — Z0000 Encounter for general adult medical examination without abnormal findings: Secondary | ICD-10-CM | POA: Diagnosis not present

## 2021-10-08 ENCOUNTER — Ambulatory Visit: Payer: PPO | Admitting: Allergy & Immunology

## 2021-10-24 ENCOUNTER — Telehealth: Payer: Self-pay | Admitting: Allergy & Immunology

## 2021-10-24 ENCOUNTER — Ambulatory Visit: Payer: PPO | Admitting: Allergy & Immunology

## 2021-10-24 NOTE — Telephone Encounter (Signed)
I called her to see how she was doing. She meant to cancel the appointment. She is on the Flovent and this is working well. She has the nasal antihistamine and the oral antihistamine and this seems to help.  She would like to see [place] after the holidays. I made an appointment for late January.   Malachi Bonds, MD [facility]

## 2021-10-28 DIAGNOSIS — Z1231 Encounter for screening mammogram for malignant neoplasm of breast: Secondary | ICD-10-CM | POA: Diagnosis not present

## 2021-11-30 DIAGNOSIS — U071 COVID-19: Secondary | ICD-10-CM | POA: Diagnosis not present

## 2021-12-03 ENCOUNTER — Ambulatory Visit: Payer: PPO | Admitting: Allergy & Immunology

## 2021-12-09 DIAGNOSIS — M150 Primary generalized (osteo)arthritis: Secondary | ICD-10-CM | POA: Diagnosis not present

## 2021-12-09 DIAGNOSIS — E663 Overweight: Secondary | ICD-10-CM | POA: Diagnosis not present

## 2021-12-09 DIAGNOSIS — M0579 Rheumatoid arthritis with rheumatoid factor of multiple sites without organ or systems involvement: Secondary | ICD-10-CM | POA: Diagnosis not present

## 2021-12-09 DIAGNOSIS — Z6826 Body mass index (BMI) 26.0-26.9, adult: Secondary | ICD-10-CM | POA: Diagnosis not present

## 2021-12-09 DIAGNOSIS — M2550 Pain in unspecified joint: Secondary | ICD-10-CM | POA: Diagnosis not present

## 2021-12-09 DIAGNOSIS — Z1589 Genetic susceptibility to other disease: Secondary | ICD-10-CM | POA: Diagnosis not present

## 2021-12-24 ENCOUNTER — Ambulatory Visit (INDEPENDENT_AMBULATORY_CARE_PROVIDER_SITE_OTHER): Payer: PPO | Admitting: Allergy & Immunology

## 2021-12-24 ENCOUNTER — Encounter: Payer: Self-pay | Admitting: Allergy & Immunology

## 2021-12-24 ENCOUNTER — Other Ambulatory Visit: Payer: Self-pay

## 2021-12-24 VITALS — BP 122/80 | HR 88 | Temp 98.8°F | Resp 18 | Ht 60.0 in | Wt 143.1 lb

## 2021-12-24 DIAGNOSIS — J4530 Mild persistent asthma, uncomplicated: Secondary | ICD-10-CM

## 2021-12-24 DIAGNOSIS — J3089 Other allergic rhinitis: Secondary | ICD-10-CM | POA: Diagnosis not present

## 2021-12-24 DIAGNOSIS — T7800XD Anaphylactic reaction due to unspecified food, subsequent encounter: Secondary | ICD-10-CM

## 2021-12-24 NOTE — Patient Instructions (Addendum)
1. Mild persistent asthma, uncomplicated - Lung testing looked great today. - We are going to try to decrease your Flovent to ONE PUFF TWICE DAILY.  - Daily controller medication(s): Flovent 1 puff twice daily with spacer - Prior to physical activity: albuterol 2 puffs 10-15 minutes before physical activity. - Rescue medications: albuterol 4 puffs every 4-6 hours as needed - Changes during respiratory infections or worsening symptoms: Increase Flovent to 4 puffs twice daily for TWO WEEKS. - Asthma control goals:  * Full participation in all desired activities (may need albuterol before activity) * Albuterol use two time or less a week on average (not counting use with activity) * Cough interfering with sleep two time or less a month * Oral steroids no more than once a year * No hospitalizations  2. Chronic rhinitis (dust mites and cat) - It seems that you have excellent control of your symptoms.  - Continue with: Zyrtec (cetirizine) 10mg  tablet once daily as needed. - Continue with: Astelin (azelastine) 2 sprays per nostril 1-2 times daily at night.  - You can use an extra dose of the antihistamine, if needed, for breakthrough symptoms.  - Consider nasal saline rinses 1-2 times daily to remove allergens from the nasal cavities as well as help with mucous clearance (this is especially helpful to do before the nasal sprays are given)  3. Mollusk allergy - I think you should do fine with other shellfish. - Continue to avoid mollusks. - EpiPen is up to date.   4. Return in about 6 months (around 06/23/2022).    Please inform 06/25/2022 of any Emergency Department visits, hospitalizations, or changes in symptoms. Call us before going to the ED for breathing or allergy symptoms since we might be able to fit you in for a sick visit. Feel free to contact us anytime with any questions, problems, or concerns.  It was a pleasure to see you again today!  Websites that have reliable patient  information: 1. American Academy of Asthma, Allergy, and Immunology: www.aaaai.org 2. Food Allergy Research and Education (FARE): foodallergy.org 3. Mothers of Asthmatics: http://www.asthmacommunitynetwork.org 4. American College of Allergy, Asthma, and Immunology: www.acaai.org   COVID-19 Vaccine Information can be found at: [place] For questions related to vaccine distribution or appointments, please email [email or web address] or call [phone].   We realize that you might be concerned about having an allergic reaction to the COVID19 vaccines. To help with that concern, WE ARE OFFERING THE COVID19 VACCINES IN OUR OFFICE! Ask the front desk for dates!     Like [phone] on [place] and Instagram for our latest updates!      A healthy democracy works best when [employer] participate! Make sure you are registered to vote! If you have moved or changed any of your contact information, you will need to get this updated before voting!  In some cases, you MAY be able to register to vote online: [employer]

## 2021-12-24 NOTE — Progress Notes (Signed)
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 Nickola Major)    Plan/Recommendations:   1. Mild persistent asthma, uncomplicated - Lung testing looked great today. - We are going to try to decrease your Flovent to ONE PUFF TWICE DAILY.  - Daily controller medication(s): Flovent 1 puff twice daily with spacer - Prior to physical activity: albuterol 2 puffs 10-15 minutes before physical activity. - Rescue medications: albuterol 4 puffs every 4-6 hours as needed - Changes during respiratory infections or worsening symptoms: Increase Flovent to 4 puffs twice daily for TWO WEEKS. - Asthma control goals:  * Full participation in all desired activities (may need albuterol before activity) * Albuterol use two time or less a week on average (not counting use with activity) * Cough interfering with sleep two time or less a month * Oral steroids no more than once a year * No hospitalizations  2. Chronic rhinitis (dust mites and cat) - It seems that you have excellent control of your symptoms.  - Continue with: Zyrtec (cetirizine) 10mg  tablet once daily as needed. - Continue with: Astelin (azelastine) 2 sprays per nostril 1-2 times daily at night.  - You can use an extra dose of the antihistamine, if needed, for breakthrough symptoms.  - Consider nasal saline rinses 1-2 times daily to remove allergens from the nasal cavities as well as help with mucous clearance (this is especially helpful to do before the nasal sprays are given)  3. Mollusk allergy - I think you should do fine with other shellfish. - Continue to avoid mollusks. - EpiPen is up  to date.   4. Return in about 6 months (around 06/23/2022).     Subjective:   Darrian Holahan is a 71 y.o. female presenting today for follow up of  Chief Complaint  Patient presents with   Follow-up    Keyawna Lennen has a history of the following: There are no problems to display for this patient.   History obtained from: chart review and patient.  Gelena is a 71 y.o. female presenting for a follow up visit.  She was last seen in August 2022.  At that time, her lung testing looked decent.  We continue with Flovent 110 mcg 2 puffs twice daily as well as albuterol 2 puffs as needed.  She had testing that was positive to dust mites and cats.  Since last visit, she has mostly done well.  Asthma/Respiratory Symptom History: She has not needed her rescue inhaler since I saw her last. She is doing the Flovent two puffs BID.  She has not been coughing at night and has not had any ER visits or urgent care visits for her breathing.  She has not needed prednisone at all since we saw her.  Allergic Rhinitis Symptom History: She only takes the antihistamine as needed. She is using the azelastine nightly before bed. This seems to help her symptoms and prevent worsening symptoms. This seems to help her avoid filling up with gunk.  She has not needed antibiotics at all.  Food Allergy Symptom History: She actually ate some shellfish when she was visiting her daughter. She ate some shrimp and she did fine.  Her original reaction was to clam. She also reacted to snails. She typically does not get snails, but she was out to eat with someone who did. She ate a small piece and broke out in hives on her forehead.   She has RA and is on ibuprofen only PRN. She has discussed starting MTX with her Rheumatologist (Dr. Nickola Major).   Otherwise, there have been no changes to her past medical history, surgical history, family history, or social history.    Review of Systems  Constitutional: Negative.  Negative  for fever, malaise/fatigue and weight loss.  HENT: Negative.  Negative for congestion, ear discharge and ear pain.   Eyes:  Negative for pain, discharge and redness.  Respiratory:  Negative for cough, sputum production, shortness of breath and wheezing.   Cardiovascular: Negative.  Negative for chest pain and palpitations.  Gastrointestinal:  Negative for abdominal pain, heartburn, nausea and vomiting.  Musculoskeletal:  Positive for joint pain (bilateral hands).  Skin: Negative.  Negative for itching and rash.  Neurological:  Negative for dizziness and headaches.  Endo/Heme/Allergies:  Negative for environmental allergies. Does not bruise/bleed easily.      Objective:   Blood pressure 122/80, pulse 88, temperature 98.8 F (37.1 C), resp. rate 18, height 5' (1.524 m), weight 143 lb 2 oz (64.9 kg), SpO2 95 %. Body mass index is 27.95 kg/m.   Physical Exam:  Physical Exam Constitutional:      Appearance: She is well-developed.     Comments: Very pleasant and talkative.  HENT:     Head: Normocephalic and atraumatic.     Right Ear: Tympanic membrane, ear canal and external ear normal. No drainage, swelling or tenderness. Tympanic membrane is not injected, scarred, erythematous, retracted or bulging.     Left Ear: Tympanic membrane, ear canal and external ear normal. No drainage, swelling or tenderness. Tympanic membrane is not injected, scarred, erythematous, retracted or bulging.     Nose: Rhinorrhea present. No nasal deformity, septal deviation or mucosal edema.     Right Turbinates: Enlarged, swollen and pale.     Left Turbinates: Enlarged, swollen and pale.     Right Sinus: No maxillary sinus tenderness or frontal sinus tenderness.     Left Sinus: No maxillary sinus tenderness or frontal sinus tenderness.     Comments: No nasal polyps.     Mouth/Throat:     Mouth: Mucous membranes are not pale and not dry.     Pharynx: Uvula midline.  Eyes:     General:        Right eye: No  discharge.        Left eye: No discharge.     Conjunctiva/sclera: Conjunctivae normal.     Right eye: Right conjunctiva is not injected. No chemosis.    Left eye: Left conjunctiva is not injected. No chemosis.    Pupils: Pupils are equal, round, and reactive to light.  Cardiovascular:     Rate and Rhythm: Normal rate and regular rhythm.     Heart sounds: Normal heart sounds.  Pulmonary:     Effort: Pulmonary effort is normal. No tachypnea, accessory muscle usage or respiratory distress.     Breath sounds: Normal breath sounds. No wheezing, rhonchi or rales.     Comments: Moving air well in all lung fields.  No increased work of breathing. Chest:     Chest wall: No tenderness.  Lymphadenopathy:     Head:     Right side of head: No submandibular, tonsillar or  occipital adenopathy.     Left side of head: No submandibular, tonsillar or occipital adenopathy.     Cervical: No cervical adenopathy.  Skin:    General: Skin is warm.     Capillary Refill: Capillary refill takes less than 2 seconds.     Coloration: Skin is not pale.     Findings: No abrasion, erythema, petechiae or rash. Rash is not papular, urticarial or vesicular.     Comments: No eczematous or urticarial lesions noted.  Neurological:     Mental Status: She is alert.  Psychiatric:        Behavior: Behavior is cooperative.     Diagnostic studies:    Spirometry: results normal (FEV1: 1.68/91%, FVC: 2.20/92%, FEV1/FVC: 76%).    Spirometry consistent with normal pattern.   Allergy Studies: none        Malachi Bonds, MD  [facility]

## 2021-12-25 ENCOUNTER — Encounter: Payer: Self-pay | Admitting: Allergy & Immunology

## 2022-01-08 DIAGNOSIS — L237 Allergic contact dermatitis due to plants, except food: Secondary | ICD-10-CM | POA: Diagnosis not present

## 2022-02-13 DIAGNOSIS — L299 Pruritus, unspecified: Secondary | ICD-10-CM | POA: Diagnosis not present

## 2022-02-13 DIAGNOSIS — R21 Rash and other nonspecific skin eruption: Secondary | ICD-10-CM | POA: Diagnosis not present

## 2022-06-09 DIAGNOSIS — E663 Overweight: Secondary | ICD-10-CM | POA: Diagnosis not present

## 2022-06-09 DIAGNOSIS — Z79899 Other long term (current) drug therapy: Secondary | ICD-10-CM | POA: Diagnosis not present

## 2022-06-09 DIAGNOSIS — Z1589 Genetic susceptibility to other disease: Secondary | ICD-10-CM | POA: Diagnosis not present

## 2022-06-09 DIAGNOSIS — Z6827 Body mass index (BMI) 27.0-27.9, adult: Secondary | ICD-10-CM | POA: Diagnosis not present

## 2022-06-09 DIAGNOSIS — M0579 Rheumatoid arthritis with rheumatoid factor of multiple sites without organ or systems involvement: Secondary | ICD-10-CM | POA: Diagnosis not present

## 2022-06-09 DIAGNOSIS — M1991 Primary osteoarthritis, unspecified site: Secondary | ICD-10-CM | POA: Diagnosis not present

## 2022-06-24 ENCOUNTER — Encounter: Payer: Self-pay | Admitting: Allergy & Immunology

## 2022-06-24 ENCOUNTER — Ambulatory Visit: Payer: PPO | Admitting: Allergy & Immunology

## 2022-06-24 VITALS — BP 150/60 | HR 86 | Temp 97.9°F | Resp 16 | Wt 142.2 lb

## 2022-06-24 DIAGNOSIS — J3089 Other allergic rhinitis: Secondary | ICD-10-CM

## 2022-06-24 DIAGNOSIS — J4530 Mild persistent asthma, uncomplicated: Secondary | ICD-10-CM

## 2022-06-24 DIAGNOSIS — T7800XD Anaphylactic reaction due to unspecified food, subsequent encounter: Secondary | ICD-10-CM

## 2022-06-24 MED ORDER — FAMOTIDINE 40 MG PO TABS: 40.0000 mg | ORAL_TABLET | Freq: Every day | ORAL | 5 refills | Status: DC

## 2022-06-24 MED ORDER — FLOVENT HFA 110 MCG/ACT IN AERO: 1.0000 | INHALATION_SPRAY | Freq: Two times a day (BID) | RESPIRATORY_TRACT | 5 refills | Status: DC

## 2022-06-24 NOTE — Patient Instructions (Addendum)
1. Mild persistent asthma, uncomplicated - Lung testing looked great today. - We are going to add on PEPCID (FAMOTIDINE) 40mg  at night to see if this nighttime wheezing is related to reflux at all. - Daily controller medication(s): Flovent 1 puff twice daily with spacer - Prior to physical activity: albuterol 2 puffs 10-15 minutes before physical activity. - Rescue medications: albuterol 4 puffs every 4-6 hours as needed - Changes during respiratory infections or worsening symptoms: Increase Flovent to 4 puffs twice daily for TWO WEEKS. - Asthma control goals:  * Full participation in all desired activities (may need albuterol before activity) * Albuterol use two time or less a week on average (not counting use with activity) * Cough interfering with sleep two time or less a month * Oral steroids no more than once a year * No hospitalizations  2. Chronic rhinitis (dust mites and cat) - It seems that you have excellent control of your symptoms.  - Continue with: Zyrtec (cetirizine) 10mg  tablet once daily as needed. - Continue with: Astelin (azelastine) 2 sprays per nostril 1-2 times daily at night.  - You can use an extra dose of the antihistamine, if needed, for breakthrough symptoms.  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 - EpiPen is up to date.   4. Return in about 6 months (around 12/25/2022).    Please inform of any Emergency Department visits, hospitalizations, or changes in symptoms. Call 12/27/2022 before going to the ED for breathing or allergy symptoms since we might be able to fit you in for a sick visit. Feel free to contact us anytime with any questions, problems, or concerns.  It was a pleasure to see you again today!  Websites that have reliable patient  information: 1. American Academy of Asthma, Allergy, and Immunology: www.aaaai.org 2. Food Allergy Research and Education (FARE): foodallergy.org 3. Mothers of Asthmatics: http://www.asthmacommunitynetwork.org 4. American College of Allergy, Asthma, and Immunology: www.acaai.org   COVID-19 Vaccine Information can be found at: [place] For questions related to vaccine distribution or appointments, please email [email or web address] or call [phone].   We realize that you might be concerned about having an allergic reaction to the COVID19 vaccines. To help with that concern, WE ARE OFFERING THE COVID19 VACCINES IN OUR OFFICE! Ask the front desk for dates!     "Like" [email or web address] on Facebook and Instagram for our latest updates!      A healthy democracy works best when [phone] participate! Make sure you are registered to vote! If you have moved or changed any of your contact information, you will need to get this updated before voting!  In some cases, you MAY be able to register to vote online: [place]

## 2022-06-24 NOTE — Progress Notes (Signed)
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 Nickola Major)  Plan/Recommendations:   1. Mild persistent asthma, uncomplicated - Lung testing looked great today. - We are going to add on PEPCID (FAMOTIDINE) 40mg  at night to see if this nighttime wheezing is related to reflux at all. - Daily controller medication(s): Flovent 1 puff twice daily with spacer - Prior to physical activity: albuterol 2 puffs 10-15 minutes before physical activity. - Rescue medications: albuterol 4 puffs every 4-6 hours as needed - Changes during respiratory infections or worsening symptoms: Increase Flovent to 4 puffs twice daily for TWO WEEKS. - Asthma control goals:  * Full participation in all desired activities (may need albuterol before activity) * Albuterol use two time or less a week on average (not counting use with activity) * Cough interfering with sleep two time or less a month * Oral steroids no more than once a year * No hospitalizations  2. Chronic rhinitis (dust mites and cat) - It seems that you have excellent control of your symptoms.  - Continue with: Zyrtec (cetirizine) 10mg  tablet once daily as needed. - Continue with: Astelin (azelastine) 2 sprays per nostril 1-2 times daily at night.  - You can use an extra dose of the antihistamine, if needed, for breakthrough symptoms.  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 - EpiPen is up to  date.   4. Return in about 6 months (around 12/25/2022).   Subjective:   Priscilla Wilson is a 71 y.o. 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    Priscilla Wilson has a history of the following: There are no problems to display for this patient.   History obtained from: chart review and patient.  Priscilla Wilson is a 71 y.o. female presenting for a follow up visit.  She was last seen in February 2023.  At that time, her lung testing looked great.  We recommended decreasing her Flovent to 1 puff twice daily to see if this would control her symptoms effectively.  For her rhinitis, we continue with Zyrtec as well as Astelin.  We also recommended avoidance of mollusks.  We made sure her EpiPen is up-to-date.  Since last visit, she has done well.   Asthma/Respiratory Symptom History: She does report that she is feeling better. She does get some nighttime  wheezing in her upper airway. She denies any waking up from these symptoms. She always lays on her left side and this is when the problem She notices that it is worse. She remains on the Flovent one puff twice daily. She denies that things got worse with the decrease in the dosing of the Flovent. She has been on Symbicort in the past without as much improvement in her symptoms. This was very expensive so she prefers to avoid that.   She has been doing line dancing and she feels  that she needs her albuterol to keep up with her peers. She has been doing a couple of pumps before going to class. She feels that this is helping.   Allergic Rhinitis Symptom History: She does feel that she is stuffed up in the morning. Nothing seems to come out in the mornings. She also notices that the other day, she woke up and started sneezing. She blew her nose and tried to go back to sleep. Anywhere she turned went one direction or the other. She sometimes wakes up with  drainage coming down incessantly.    Food Allergy Symptom History: She continues to avoid mollusks.  She has had no accidental exposures.  She does have an up-to-date EpiPen.  She has RA and is on meloxicam once daily. She has discussed starting MTX with her Rheumatologist (Dr. Nickola Major).  He is also on a turmeric supplement.  Recent complete blood count from July 2023 was normal.  CRP was mildly elevated at 14.  Metabolic panel was normal.  Otherwise, there have been no changes to her past medical history, surgical history, family history, or social history.    Review of Systems  Constitutional: Negative.  Negative for fever, malaise/fatigue and weight loss.  HENT: Negative.  Negative for congestion, ear discharge and ear pain.   Eyes:  Negative for pain, discharge and redness.  Respiratory:  Negative for cough, sputum production, shortness of breath and wheezing.   Cardiovascular: Negative.  Negative for chest pain and palpitations.  Gastrointestinal:  Negative for abdominal pain, heartburn, nausea and vomiting.  Musculoskeletal:  Positive for joint pain (bilateral hands).  Skin: Negative.  Negative for itching and rash.  Neurological:  Negative for dizziness and headaches.  Endo/Heme/Allergies:  Negative for environmental allergies. Does not bruise/bleed easily.       Objective:   Blood pressure (!) 150/60, pulse 86, temperature 97.9 F (36.6 C), temperature source Temporal, resp. rate 16, weight 142 lb 3.2 oz (64.5 kg), SpO2 96 %. Body mass index is 27.77 kg/m.    Physical Exam Vitals reviewed.  Constitutional:      Appearance: She is well-developed.     Comments: Very pleasant and talkative.  HENT:     Head: Normocephalic and atraumatic.     Right Ear: Tympanic membrane, ear canal and external ear normal. No drainage, swelling or tenderness. Tympanic membrane is not injected, scarred, erythematous, retracted or bulging.     Left Ear: Tympanic membrane, ear canal and  external ear normal. No drainage, swelling or tenderness. Tympanic membrane is not injected, scarred, erythematous, retracted or bulging.     Nose: Rhinorrhea present. No nasal deformity, septal deviation or mucosal edema.     Right Turbinates: Enlarged, swollen and pale.     Left Turbinates: Enlarged, swollen and pale.     Right Sinus: No maxillary sinus tenderness or frontal sinus tenderness.     Left Sinus: No maxillary sinus tenderness or frontal sinus tenderness.     Comments: No nasal polyps.     Mouth/Throat:     Mouth: Mucous membranes are not pale and not dry.     Pharynx: Uvula midline.  Eyes:     General:        Right eye: No discharge.        Left eye: No discharge.     Conjunctiva/sclera: Conjunctivae normal.     Right eye: Right conjunctiva is not injected. No chemosis.    Left eye: Left conjunctiva is not injected. No chemosis.  Pupils: Pupils are equal, round, and reactive to light.  Cardiovascular:     Rate and Rhythm: Normal rate and regular rhythm.     Heart sounds: Normal heart sounds.  Pulmonary:     Effort: Pulmonary effort is normal. No tachypnea, accessory muscle usage or respiratory distress.     Breath sounds: Normal breath sounds. No wheezing, rhonchi or rales.     Comments: Moving air well in all lung fields.  No increased work of breathing. Chest:     Chest wall: No tenderness.  Lymphadenopathy:     Head:     Right side of head: No submandibular, tonsillar or occipital adenopathy.     Left side of head: No submandibular, tonsillar or occipital adenopathy.     Cervical: No cervical adenopathy.  Skin:    General: Skin is warm.     Capillary Refill: Capillary refill takes less than 2 seconds.     Coloration: Skin is not pale.     Findings: No abrasion, erythema, petechiae or rash. Rash is not papular, urticarial or vesicular.     Comments: No eczematous or urticarial lesions noted.  Neurological:     Mental Status: She is alert.  Psychiatric:         Behavior: Behavior is cooperative.      Diagnostic studies:    /Spirometry: results normal (FEV1: 1.86/101%, FVC: 2.49/106%, FEV1/FVC: 75%).  Spirometry is within normal limits.    Allergy Studies: none       Malachi Bonds, MD  [facility]

## 2022-06-26 ENCOUNTER — Encounter: Payer: Self-pay | Admitting: Allergy & Immunology

## 2022-07-16 ENCOUNTER — Telehealth: Payer: Self-pay | Admitting: Allergy & Immunology

## 2022-07-16 NOTE — Telephone Encounter (Signed)
Patient called and stated that Dr Dellis Anes wanted her to call in to let him know if the famotidine works for her. Patient states that medication is very helpful and is working.

## 2022-07-17 NOTE — Telephone Encounter (Signed)
Good I am so glad to hear that!   Malachi Bonds, MD [facility]

## 2022-07-22 DIAGNOSIS — H2513 Age-related nuclear cataract, bilateral: Secondary | ICD-10-CM | POA: Diagnosis not present

## 2022-10-08 DIAGNOSIS — K5901 Slow transit constipation: Secondary | ICD-10-CM | POA: Diagnosis not present

## 2022-10-08 DIAGNOSIS — J4520 Mild intermittent asthma, uncomplicated: Secondary | ICD-10-CM | POA: Diagnosis not present

## 2022-10-08 DIAGNOSIS — E7800 Pure hypercholesterolemia, unspecified: Secondary | ICD-10-CM | POA: Diagnosis not present

## 2022-10-08 DIAGNOSIS — F325 Major depressive disorder, single episode, in full remission: Secondary | ICD-10-CM | POA: Diagnosis not present

## 2022-10-08 DIAGNOSIS — Z79899 Other long term (current) drug therapy: Secondary | ICD-10-CM | POA: Diagnosis not present

## 2022-10-08 DIAGNOSIS — R946 Abnormal results of thyroid function studies: Secondary | ICD-10-CM | POA: Diagnosis not present

## 2022-10-14 ENCOUNTER — Other Ambulatory Visit: Payer: Self-pay | Admitting: Gastroenterology

## 2022-10-14 DIAGNOSIS — R7989 Other specified abnormal findings of blood chemistry: Secondary | ICD-10-CM | POA: Diagnosis not present

## 2022-10-14 DIAGNOSIS — K625 Hemorrhage of anus and rectum: Secondary | ICD-10-CM | POA: Diagnosis not present

## 2022-10-14 DIAGNOSIS — R5383 Other fatigue: Secondary | ICD-10-CM | POA: Diagnosis not present

## 2022-10-14 DIAGNOSIS — K5901 Slow transit constipation: Secondary | ICD-10-CM | POA: Diagnosis not present

## 2022-10-20 DIAGNOSIS — K625 Hemorrhage of anus and rectum: Secondary | ICD-10-CM | POA: Diagnosis not present

## 2022-10-20 DIAGNOSIS — R5383 Other fatigue: Secondary | ICD-10-CM | POA: Diagnosis not present

## 2022-10-20 DIAGNOSIS — E0590 Thyrotoxicosis, unspecified without thyrotoxic crisis or storm: Secondary | ICD-10-CM | POA: Diagnosis not present

## 2022-10-30 ENCOUNTER — Other Ambulatory Visit: Payer: Self-pay | Admitting: Gastroenterology

## 2022-10-30 ENCOUNTER — Other Ambulatory Visit (HOSPITAL_COMMUNITY): Payer: Self-pay

## 2022-10-30 DIAGNOSIS — Z6824 Body mass index (BMI) 24.0-24.9, adult: Secondary | ICD-10-CM | POA: Diagnosis not present

## 2022-10-30 DIAGNOSIS — R1310 Dysphagia, unspecified: Secondary | ICD-10-CM

## 2022-10-30 DIAGNOSIS — Z0000 Encounter for general adult medical examination without abnormal findings: Secondary | ICD-10-CM | POA: Diagnosis not present

## 2022-10-30 DIAGNOSIS — Z1389 Encounter for screening for other disorder: Secondary | ICD-10-CM | POA: Diagnosis not present

## 2022-11-04 DIAGNOSIS — Z1231 Encounter for screening mammogram for malignant neoplasm of breast: Secondary | ICD-10-CM | POA: Diagnosis not present

## 2022-11-05 ENCOUNTER — Ambulatory Visit
Admission: RE | Admit: 2022-11-05 | Discharge: 2022-11-05 | Disposition: A | Discharge status: Home / Self Care | Payer: PPO | Source: Ambulatory Visit | Attending: Gastroenterology | Admitting: Gastroenterology

## 2022-11-05 DIAGNOSIS — R7989 Other specified abnormal findings of blood chemistry: Secondary | ICD-10-CM

## 2022-11-19 ENCOUNTER — Ambulatory Visit (HOSPITAL_COMMUNITY)
Admission: RE | Admit: 2022-11-19 | Discharge: 2022-11-19 | Disposition: A | Discharge status: Home / Self Care | Payer: PPO | Source: Ambulatory Visit | Attending: Family Medicine | Admitting: Family Medicine

## 2022-11-19 ENCOUNTER — Ambulatory Visit (HOSPITAL_COMMUNITY)
Admission: RE | Admit: 2022-11-19 | Discharge: 2022-11-19 | Disposition: A | Discharge status: Home / Self Care | Payer: PPO | Source: Ambulatory Visit | Attending: Gastroenterology | Admitting: Gastroenterology

## 2022-11-19 DIAGNOSIS — R1310 Dysphagia, unspecified: Secondary | ICD-10-CM

## 2022-12-11 DIAGNOSIS — Z6824 Body mass index (BMI) 24.0-24.9, adult: Secondary | ICD-10-CM | POA: Diagnosis not present

## 2022-12-11 DIAGNOSIS — Z79899 Other long term (current) drug therapy: Secondary | ICD-10-CM | POA: Diagnosis not present

## 2022-12-11 DIAGNOSIS — Z1589 Genetic susceptibility to other disease: Secondary | ICD-10-CM | POA: Diagnosis not present

## 2022-12-11 DIAGNOSIS — E0590 Thyrotoxicosis, unspecified without thyrotoxic crisis or storm: Secondary | ICD-10-CM | POA: Diagnosis not present

## 2022-12-11 DIAGNOSIS — M0579 Rheumatoid arthritis with rheumatoid factor of multiple sites without organ or systems involvement: Secondary | ICD-10-CM | POA: Diagnosis not present

## 2022-12-11 DIAGNOSIS — M1991 Primary osteoarthritis, unspecified site: Secondary | ICD-10-CM | POA: Diagnosis not present

## 2022-12-18 ENCOUNTER — Other Ambulatory Visit: Payer: Self-pay | Admitting: Allergy & Immunology

## 2022-12-25 ENCOUNTER — Ambulatory Visit: Payer: PPO | Admitting: Allergy & Immunology

## 2022-12-30 ENCOUNTER — Ambulatory Visit (INDEPENDENT_AMBULATORY_CARE_PROVIDER_SITE_OTHER): Payer: PPO | Admitting: Allergy & Immunology

## 2022-12-30 ENCOUNTER — Encounter: Payer: Self-pay | Admitting: Allergy & Immunology

## 2022-12-30 ENCOUNTER — Other Ambulatory Visit: Payer: Self-pay

## 2022-12-30 VITALS — BP 138/82 | HR 100 | Temp 97.5°F | Resp 20 | Ht 60.0 in | Wt 131.6 lb

## 2022-12-30 DIAGNOSIS — T7800XD Anaphylactic reaction due to unspecified food, subsequent encounter: Secondary | ICD-10-CM | POA: Diagnosis not present

## 2022-12-30 DIAGNOSIS — J4530 Mild persistent asthma, uncomplicated: Secondary | ICD-10-CM

## 2022-12-30 DIAGNOSIS — J3089 Other allergic rhinitis: Secondary | ICD-10-CM

## 2022-12-30 DIAGNOSIS — K219 Gastro-esophageal reflux disease without esophagitis: Secondary | ICD-10-CM | POA: Diagnosis not present

## 2022-12-30 MED ORDER — FLUTICASONE-SALMETEROL 250-50 MCG/ACT IN AEPB: 1.0000 | INHALATION_SPRAY | Freq: Two times a day (BID) | RESPIRATORY_TRACT | 5 refills | Status: DC

## 2022-12-30 NOTE — Progress Notes (Unsigned)
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 Trudie Reed)  Plan/Recommendations:    There are no Patient Instructions on file for this visit.   Subjective:   Priscilla Wilson is a 72 y.o. female presenting today for follow up of No chief complaint on file.   Priscilla Wilson has a history of the following: There are no problems to display for this patient.   History obtained from: chart review and {Persons; PED relatives w/patient:19415::"patient"}.  Priscilla Wilson is a 72 y.o. female presenting for {Blank single:19197::"a food challenge","a drug challenge","skin testing","a sick visit","an evaluation of ***","a follow up visit"}.  She was last seen in August 2023.  At that time, her lung testing looked great.  We added on Pepcid at night to see if that helped with her nighttime wheezing.  We continue with Flovent 110 mcg twice daily as well as albuterol as needed.  For her rhinitis, we continue with Zyrtec as well as Astelin.  She continue to avoid mollusks.   Since the last visit, she has mostly done well. She developed a hyperactive thyroid which was diagnosed with mid December. She lost 15 pounds unintentionally. She was very hyper. She was placed on methimazole. She has not seen Endocrinology. She is going to see Dr. Meredith Wilson. 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    Asthma/Respiratory Symptom History: Asthma has been under good control. She has not had much in the way of problems. She has some morning coughing and sputum production. This is definitely more mornings than not. She has been waking up at night.  She remains on the Flovent which is covered, but  when I bring this up, she had a letter from her insurance company. She has not needed prednisone for her breathing at all and she has not been using her albuterol at all.  We reviewed what was cpovered on her plan and Advair diskus.   {Blank single:19197::"Allergic Rhinitis Symptom History: ***"," "}  {Blank single:19197::"Food Allergy Symptom History: ***"," "}  {Blank single:19197::"Skin Symptom History: ***"," "}  GERD Symptom History: She did add the Pepcid at night. This did help. Her worst mornings are when she stays up late and eats. So this might be related as well.   Otherwise, there have been no changes to her past medical history, surgical history, family history, or social history.    ROS     Objective:   There were no vitals taken for this visit. There is no height or weight on file to calculate BMI.    Physical Exam   Diagnostic studies: {Blank single:19197::"none","deferred due to recent antihistamine use","labs sent instead"," "}  Spirometry: {Blank single:19197::"results normal (FEV1: ***%, FVC: ***%, FEV1/FVC: ***%)","results abnormal (FEV1: ***%, FVC: ***%, FEV1/FVC: ***%)"}.    {Blank single:19197::"Spirometry consistent with mild obstructive disease","Spirometry consistent with moderate obstructive disease","Spirometry consistent with severe obstructive disease","Spirometry consistent with possible restrictive disease","Spirometry consistent with mixed obstructive and restrictive disease","Spirometry uninterpretable due to technique","Spirometry consistent with normal pattern"}. {Blank single:19197::"Albuterol/Atrovent nebulizer","Xopenex/Atrovent nebulizer","Albuterol nebulizer","Albuterol four puffs via MDI","Xopenex four puffs via MDI"} treatment given in clinic with {Blank single:19197::"significant improvement in FEV1 per ATS criteria","significant improvement in FVC per ATS criteria","significant improvement in FEV1 and FVC per ATS  criteria","improvement in  FEV1, but not significant per ATS criteria","improvement in FVC, but not significant per ATS criteria","improvement in FEV1 and FVC, but not significant per ATS criteria","no improvement"}.  Allergy Studies: {Blank single:19197::"none","labs sent instead"," "}    {Blank single:19197::"Allergy testing results were read and interpreted by myself, documented by clinical staff."," "}      Salvatore Marvel, MD  [facility]

## 2022-12-30 NOTE — Patient Instructions (Addendum)
1. Mild persistent asthma, uncomplicated - Lung testing looked great today. - We are going to change you to Advair 250/50 mcg one puff every night - It looks like this is covered from my end, but let us know if this is not the case.  - Daily controller medication(s): Advair 250/9mg one puff every night - Prior to physical activity: albuterol 2 puffs 10-15 minutes before physical activity. - Rescue medications: albuterol 4 puffs every 4-6 hours as needed - Changes during respiratory infections or worsening symptoms: Increase Advair to 1 puff twice daily for TWO WEEKS. - Asthma control goals:  * Full participation in all desired activities (may need albuterol before activity) * Albuterol use two time or less a week on average (not counting use with activity) * Cough interfering with sleep two time or less a month * Oral steroids no more than once a year * No hospitalizations  2. Chronic rhinitis (dust mites and cat) - It seems that you have excellent control of your symptoms.  - Continue with: Zyrtec (cetirizine) 149mtablet once daily as needed - Stop taking: Astelin (azelastine)  - Start taking: Flonase (fluticasone) two sprays per nostril at night.  - You can use an extra dose of the antihistamine, if needed, for breakthrough symptoms.  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 - EpiPen is up to date.   4. Return in about 6 months (around 06/30/2023).    Please inform usKoreaf any Emergency Department visits, hospitalizations, or changes in symptoms. Call usKoreaefore going to the ED for breathing or allergy symptoms since we might be able to fit you in for a sick visit. Feel free to contact usKoreanytime with any questions, problems, or concerns.  It was a pleasure to see you again  today!  Websites that have reliable patient information: 1. American Academy of Asthma, Allergy, and Immunology: www.aaaai.org 2. Food Allergy Research and Education (FARE): foodallergy.org 3. Mothers of Asthmatics: http://www.asthmacommunitynetwork.org 4. American College of Allergy, Asthma, and Immunology: www.acaai.org   COVID-19 Vaccine Information can be found at: [email or web address] questions related to vaccine distribution or appointments, please email [email or web address] or call [phone]  We realize that you might be concerned about having an allergic reaction to the COVID19 vaccines. To help with that concern, WE ARE OFFERING THE COVID19 VACCINES IN OUR OFFICE! Ask the front desk for dates!     "Like" usKorean Facebook and Instagram for our latest updates!      A healthy democracy works best when ALNew York Life Insurancearticipate! Make sure you are registered to vote! If you have moved or changed any of your contact information, you will need to get this updated before voting!  In some cases, you MAY be able to register to vote online: [email or web address]

## 2022-12-31 ENCOUNTER — Encounter: Payer: Self-pay | Admitting: Allergy & Immunology

## 2023-01-12 DIAGNOSIS — R945 Abnormal results of liver function studies: Secondary | ICD-10-CM | POA: Diagnosis not present

## 2023-01-12 DIAGNOSIS — K625 Hemorrhage of anus and rectum: Secondary | ICD-10-CM | POA: Diagnosis not present

## 2023-01-12 DIAGNOSIS — E079 Disorder of thyroid, unspecified: Secondary | ICD-10-CM | POA: Diagnosis not present

## 2023-01-12 DIAGNOSIS — K5900 Constipation, unspecified: Secondary | ICD-10-CM | POA: Diagnosis not present

## 2023-01-13 ENCOUNTER — Other Ambulatory Visit: Payer: Self-pay | Admitting: Allergy & Immunology

## 2023-01-14 DIAGNOSIS — R7989 Other specified abnormal findings of blood chemistry: Secondary | ICD-10-CM | POA: Diagnosis not present

## 2023-01-14 DIAGNOSIS — E0590 Thyrotoxicosis, unspecified without thyrotoxic crisis or storm: Secondary | ICD-10-CM | POA: Diagnosis not present

## 2023-01-14 DIAGNOSIS — E7800 Pure hypercholesterolemia, unspecified: Secondary | ICD-10-CM | POA: Diagnosis not present

## 2023-01-14 DIAGNOSIS — Z79899 Other long term (current) drug therapy: Secondary | ICD-10-CM | POA: Diagnosis not present

## 2023-01-14 DIAGNOSIS — R945 Abnormal results of liver function studies: Secondary | ICD-10-CM | POA: Diagnosis not present

## 2023-01-14 DIAGNOSIS — E049 Nontoxic goiter, unspecified: Secondary | ICD-10-CM | POA: Diagnosis not present

## 2023-02-26 DIAGNOSIS — J320 Chronic maxillary sinusitis: Secondary | ICD-10-CM | POA: Diagnosis not present

## 2023-02-26 DIAGNOSIS — R930 Abnormal findings on diagnostic imaging of skull and head, not elsewhere classified: Secondary | ICD-10-CM | POA: Diagnosis not present

## 2023-03-02 DIAGNOSIS — J3489 Other specified disorders of nose and nasal sinuses: Secondary | ICD-10-CM | POA: Diagnosis not present

## 2023-03-02 DIAGNOSIS — J342 Deviated nasal septum: Secondary | ICD-10-CM | POA: Diagnosis not present

## 2023-03-02 DIAGNOSIS — J320 Chronic maxillary sinusitis: Secondary | ICD-10-CM | POA: Diagnosis not present

## 2023-03-02 DIAGNOSIS — J329 Chronic sinusitis, unspecified: Secondary | ICD-10-CM | POA: Diagnosis not present

## 2023-03-12 DIAGNOSIS — Z13820 Encounter for screening for osteoporosis: Secondary | ICD-10-CM | POA: Diagnosis not present

## 2023-03-12 DIAGNOSIS — N951 Menopausal and female climacteric states: Secondary | ICD-10-CM | POA: Diagnosis not present

## 2023-04-22 DIAGNOSIS — E0590 Thyrotoxicosis, unspecified without thyrotoxic crisis or storm: Secondary | ICD-10-CM | POA: Diagnosis not present

## 2023-04-22 DIAGNOSIS — M8580 Other specified disorders of bone density and structure, unspecified site: Secondary | ICD-10-CM | POA: Diagnosis not present

## 2023-04-22 DIAGNOSIS — Z79899 Other long term (current) drug therapy: Secondary | ICD-10-CM | POA: Diagnosis not present

## 2023-04-22 DIAGNOSIS — E049 Nontoxic goiter, unspecified: Secondary | ICD-10-CM | POA: Diagnosis not present

## 2023-05-09 ENCOUNTER — Other Ambulatory Visit: Payer: Self-pay | Admitting: Allergy & Immunology

## 2023-05-29 DIAGNOSIS — J069 Acute upper respiratory infection, unspecified: Secondary | ICD-10-CM | POA: Diagnosis not present

## 2023-06-11 DIAGNOSIS — M0579 Rheumatoid arthritis with rheumatoid factor of multiple sites without organ or systems involvement: Secondary | ICD-10-CM | POA: Diagnosis not present

## 2023-06-11 DIAGNOSIS — Z79899 Other long term (current) drug therapy: Secondary | ICD-10-CM | POA: Diagnosis not present

## 2023-06-11 DIAGNOSIS — M1991 Primary osteoarthritis, unspecified site: Secondary | ICD-10-CM | POA: Diagnosis not present

## 2023-06-11 DIAGNOSIS — Z1589 Genetic susceptibility to other disease: Secondary | ICD-10-CM | POA: Diagnosis not present

## 2023-06-11 DIAGNOSIS — E663 Overweight: Secondary | ICD-10-CM | POA: Diagnosis not present

## 2023-06-11 DIAGNOSIS — Z6825 Body mass index (BMI) 25.0-25.9, adult: Secondary | ICD-10-CM | POA: Diagnosis not present

## 2023-06-16 DIAGNOSIS — R7989 Other specified abnormal findings of blood chemistry: Secondary | ICD-10-CM | POA: Diagnosis not present

## 2023-06-16 DIAGNOSIS — Z1211 Encounter for screening for malignant neoplasm of colon: Secondary | ICD-10-CM | POA: Diagnosis not present

## 2023-06-16 DIAGNOSIS — K5900 Constipation, unspecified: Secondary | ICD-10-CM | POA: Diagnosis not present

## 2023-06-30 ENCOUNTER — Ambulatory Visit: Payer: PPO | Admitting: Allergy & Immunology

## 2023-06-30 ENCOUNTER — Other Ambulatory Visit: Payer: Self-pay

## 2023-06-30 ENCOUNTER — Encounter: Payer: Self-pay | Admitting: Allergy & Immunology

## 2023-06-30 VITALS — BP 112/70 | HR 102 | Temp 97.6°F | Resp 16 | Ht 60.5 in | Wt 137.5 lb

## 2023-06-30 DIAGNOSIS — J3089 Other allergic rhinitis: Secondary | ICD-10-CM | POA: Diagnosis not present

## 2023-06-30 DIAGNOSIS — T7800XD Anaphylactic reaction due to unspecified food, subsequent encounter: Secondary | ICD-10-CM | POA: Diagnosis not present

## 2023-06-30 DIAGNOSIS — J4530 Mild persistent asthma, uncomplicated: Secondary | ICD-10-CM | POA: Diagnosis not present

## 2023-06-30 DIAGNOSIS — K219 Gastro-esophageal reflux disease without esophagitis: Secondary | ICD-10-CM | POA: Diagnosis not present

## 2023-06-30 MED ORDER — FAMOTIDINE 40 MG PO TABS: 40.0000 mg | ORAL_TABLET | Freq: Every day | ORAL | 3 refills | Status: DC

## 2023-06-30 NOTE — Progress Notes (Signed)
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 Nickola Major)   Hyperthyroidism - improved on methimazole (followed by Dr. Roanna Raider)   Hypercholesterolemia - on simvastatin   GERD - controlled with famotidine    Plan/Recommendations:   1. Mild persistent asthma, uncomplicated - Lung testing looked great today. - I am fine with your current regimen since you are doing so well.  - Daily controller medication(s): NOTHING - Prior to physical activity: albuterol 2 puffs 10-15 minutes before physical activity. - Rescue medications: albuterol 4 puffs every 4-6 hours as needed - Changes during respiratory infections or worsening symptoms: ADD ON Advair to 1 puff twice daily for TWO WEEKS. - Asthma control goals:  * Full participation in all desired activities (may need albuterol before activity) * Albuterol use two time or less a week on average (not counting use with activity) * Cough interfering with sleep two time or less a month * Oral steroids no more than once a year * No hospitalizations  2. Chronic rhinitis (dust mites and cat) - It seems that you have excellent control of your symptoms.  - Continue with: Zyrtec (cetirizine) 10mg  tablet once daily as needed and the Flonase as needed - You can use an extra dose of the antihistamine, if needed, for breakthrough symptoms.  - Consider nasal saline rinses 1-2 times daily to remove allergens from the nasal cavities as well as help with mucous clearance (this is especially helpful to do before the nasal sprays are given)  3. Mollusk allergy - Continue to avoid  mollusks. - EpiPen is up to date.   4. GERD - Continue with famotidine 40mg  at night.   5. Return in about 1 year (around 06/29/2024).    Subjective:   Priscilla Wilson is a 72 y.o. female presenting today for follow up of  Chief Complaint  Patient presents with   Asthma    Priscilla Wilson has a history of the following: There are no problems to display for this patient.   History obtained from: chart review and patient.  Priscilla Wilson is a 72 y.o. female presenting for a follow up visit.  She was last seen in February 2024.  At that time, her lung testing looked great.  We changed her to Advair 250/50 micrograms 1 puff every night as well as continuing with albuterol as needed.  For her rhinitis, continue with Zyrtec.  We stopped Astelin and started Flonase.  She continue to avoid mollusks.  Since the last visit, she has mostly done well.   Asthma/Respiratory Symptom History: She was on the Advair one puff once daily. She was not having trouble breathing and they started changing the filters more frequently which seemed to help. She noticed that her breathing improved with the filter changing. She took it for two months and then weaned off of it.   She did contract COVID19 last month. She did not get Paxlovid and just treated this with Delsum to help with her coughing. She did not try using albuterol at all for the coughing. The Delsum seemed to do the trick. She recently saw her sister who has stage IV cancer. She  contracted the COVID19 as well. She has ovarian cancer. This made their trip to [place] much shorter since they decided to just head home.   Allergic Rhinitis Symptom History: She has not needed antibiotics. She has not been using cetirizine daily. She is not using her nose spray routinely either.   Food Allergy Symptom History: She is avoiding the mollusks.  She has never had an EpiPen. She typically just avoids the food to not tempt fate.   She remains on methimazole 5 mg every  morning. The last time that she was in, her TSH was still not registering, although the T3 and T4 were within range. She sees Dr. Roanna Raider every six months. 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    Otherwise, there have been no changes to her past medical history, surgical history, family history, or social history.    Review of systems otherwise negative other than that mentioned in the HPI.    Objective:   Blood pressure 112/70, pulse (!) 102, temperature 97.6 F (36.4 C), temperature source Temporal, resp. rate 16, height 5' 0.5" (1.537 m), weight 137 lb 8 oz (62.4 kg), SpO2 97%. Body mass index is 26.41 kg/m.    Physical Exam Vitals reviewed.  Constitutional:      Appearance: She is well-developed.     Comments: Pleasant. Smiling.   HENT:     Head: Normocephalic and atraumatic.     Right Ear: Tympanic membrane, ear canal and external ear normal. No drainage, swelling or tenderness. Tympanic membrane is not injected, scarred, erythematous, retracted or bulging.     Left Ear: Tympanic membrane, ear canal and external ear normal. No drainage, swelling or tenderness. Tympanic membrane is not injected, scarred, erythematous, retracted or bulging.     Nose: No nasal deformity, septal deviation, mucosal edema or rhinorrhea.     Right Turbinates: Enlarged, swollen and pale.     Left Turbinates: Enlarged, swollen and pale.     Right Sinus: No maxillary sinus tenderness or frontal sinus tenderness.     Left Sinus: No maxillary sinus tenderness or frontal sinus tenderness.     Mouth/Throat:     Mouth: Mucous membranes are not pale and not dry.     Pharynx: Uvula midline.  Eyes:     General:        Right eye: No discharge.        Left eye: No discharge.     Conjunctiva/sclera: Conjunctivae normal.     Right eye: Right conjunctiva is not injected. No chemosis.    Left eye: Left conjunctiva is not injected. No chemosis.    Pupils: Pupils are equal, round, and reactive to light.   Cardiovascular:     Rate and Rhythm: Normal rate and regular rhythm.     Heart sounds: Normal heart sounds.  Pulmonary:     Effort: Pulmonary effort is normal. No tachypnea, accessory muscle usage or respiratory distress.     Breath sounds: Normal breath sounds. No wheezing, rhonchi or rales.  Chest:     Chest wall: No tenderness.  Abdominal:     Tenderness: There is no abdominal tenderness. There is no guarding or rebound.  Lymphadenopathy:     Head:     Right side of head: No submandibular, tonsillar or occipital adenopathy.     Left side of head: No submandibular, tonsillar or occipital adenopathy.     Cervical: No cervical adenopathy.  Skin:    Coloration: Skin is not pale.     Findings:  No abrasion, erythema, petechiae or rash. Rash is not papular, urticarial or vesicular.  Neurological:     Mental Status: She is alert.  Psychiatric:        Behavior: Behavior is cooperative.      Diagnostic studies:   Spirometry: results normal (FEV1: 1.86/103%, FVC: 2.21/94%, FEV1/FVC: 84%).    Spirometry consistent with normal pattern.   Allergy Studies: none        Malachi Bonds, MD  [facility]

## 2023-06-30 NOTE — Patient Instructions (Addendum)
1. Mild persistent asthma, uncomplicated - Lung testing looked great today. - I am fine with your current regimen since you are doing so well.  - Daily controller medication(s): NOTHING - Prior to physical activity: albuterol 2 puffs 10-15 minutes before physical activity. - Rescue medications: albuterol 4 puffs every 4-6 hours as needed - Changes during respiratory infections or worsening symptoms: ADD ON Advair to 1 puff twice daily for TWO WEEKS. - Asthma control goals:  * Full participation in all desired activities (may need albuterol before activity) * Albuterol use two time or less a week on average (not counting use with activity) * Cough interfering with sleep two time or less a month * Oral steroids no more than once a year * No hospitalizations  2. Chronic rhinitis (dust mites and cat) - It seems that you have excellent control of your symptoms.  - Continue with: Zyrtec (cetirizine) 10mg  tablet once daily as needed and the Flonase as needed - You can use an extra dose of the antihistamine, if needed, for breakthrough symptoms.  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 - EpiPen is up to date.   4. GERD - Continue with famotidine 40mg  at night.   5. Return in about 1 year (around 06/29/2024).    Please inform us of any Emergency Department visits, hospitalizations, or changes in symptoms. Call us before going to the ED for breathing or allergy symptoms since we might be able to fit you in for a sick visit. Feel free to contact us anytime with any questions, problems, or concerns.  It was a pleasure to see you again today!  Websites that have reliable patient information: 1. American Academy of Asthma, Allergy, and Immunology: www.aaaai.org 2. Food Allergy Research and  Education (FARE): foodallergy.org 3. Mothers of Asthmatics: http://www.asthmacommunitynetwork.org 4. American College of Allergy, Asthma, and Immunology: www.acaai.org   COVID-19 Vaccine Information can be found at: [email or web address].[email or web address] For questions related to vaccine distribution or appointments, please email [email or web address] or call [phone].   We realize that you might be concerned about having an allergic reaction to the COVID19 vaccines. To help with that concern, WE ARE OFFERING THE COVID19 VACCINES IN OUR OFFICE! Ask the front desk for dates!     "Like" [place] on Facebook and Instagram for our latest updates!      A healthy democracy works best when [employer] participate! Make sure you are registered to vote! If you have moved or changed any of your contact information, you will need to get this updated before voting!  In some cases, you MAY be able to register to vote online: [email or web address]

## 2023-07-08 DIAGNOSIS — K635 Polyp of colon: Secondary | ICD-10-CM | POA: Diagnosis not present

## 2023-07-08 DIAGNOSIS — Z1211 Encounter for screening for malignant neoplasm of colon: Secondary | ICD-10-CM | POA: Diagnosis not present

## 2023-07-10 DIAGNOSIS — K635 Polyp of colon: Secondary | ICD-10-CM | POA: Diagnosis not present

## 2023-07-27 DIAGNOSIS — H2513 Age-related nuclear cataract, bilateral: Secondary | ICD-10-CM | POA: Diagnosis not present

## 2023-07-27 DIAGNOSIS — H524 Presbyopia: Secondary | ICD-10-CM | POA: Diagnosis not present

## 2023-07-31 ENCOUNTER — Ambulatory Visit: Payer: PPO | Admitting: Allergy & Immunology

## 2023-07-31 ENCOUNTER — Other Ambulatory Visit: Payer: Self-pay

## 2023-07-31 ENCOUNTER — Encounter: Payer: Self-pay | Admitting: Allergy & Immunology

## 2023-07-31 VITALS — BP 138/72 | HR 103 | Temp 98.3°F | Resp 14 | Ht 60.39 in | Wt 137.0 lb

## 2023-07-31 DIAGNOSIS — J4530 Mild persistent asthma, uncomplicated: Secondary | ICD-10-CM | POA: Diagnosis not present

## 2023-07-31 DIAGNOSIS — J3089 Other allergic rhinitis: Secondary | ICD-10-CM | POA: Diagnosis not present

## 2023-07-31 DIAGNOSIS — J069 Acute upper respiratory infection, unspecified: Secondary | ICD-10-CM

## 2023-07-31 DIAGNOSIS — T7800XD Anaphylactic reaction due to unspecified food, subsequent encounter: Secondary | ICD-10-CM | POA: Diagnosis not present

## 2023-07-31 MED ORDER — PREDNISONE 10 MG PO TABS: ORAL_TABLET | ORAL | 0 refills | Status: DC

## 2023-07-31 NOTE — Patient Instructions (Addendum)
1. Mild persistent asthma, uncomplicated - Lung testing looked great today!  - Go ahead and start the Advair for a couple of weeks.  - Daily controller medication(s): NOTHING - Prior to physical activity: albuterol 2 puffs 10-15 minutes before physical activity. - Rescue medications: albuterol 4 puffs every 4-6 hours as needed - Changes during respiratory infections or worsening symptoms: ADD ON Advair to 1 puff twice daily for TWO WEEKS. - Asthma control goals:  * Full participation in all desired activities (may need albuterol before activity) * Albuterol use two time or less a week on average (not counting use with activity) * Cough interfering with sleep two time or less a month * Oral steroids no more than once a year * No hospitalizations  2. Viral URI - Start prednisone taper that we sent in today.  - We gave you 60mg  prednisone now, so you can start the prednisone that I sent in tomorrow.  - Call us Monday with an update.  - Add on Mucinex twice daily (get the one without the D)   3. Chronic rhinitis (dust mites and cat) - It seems that you have excellent control of your symptoms.  - Continue with: Zyrtec (cetirizine) 10mg  tablet once daily as needed and the Flonase as needed - You can use an extra dose of the antihistamine, if needed, for breakthrough symptoms.  - Consider nasal saline rinses 1-2 times daily to remove allergens from the nasal cavities as well as help with mucous clearance (this is especially helpful to do before the nasal sprays are given)  4. 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 - EpiPen is up to date.   5. GERD - Continue with famotidine 40mg  at night.   6. Follow up as scheduled.    Please inform us of any Emergency Department visits, hospitalizations, or changes in symptoms. Call us before going to the ED for breathing or allergy symptoms since we might be able to fit you in for a sick visit. Feel free to contact us anytime with any  questions, problems, or concerns.  It was a pleasure to see you again today!  Websites that have reliable patient information: 1. American Academy of Asthma, Allergy, and Immunology: www.aaaai.org 2. Food Allergy Research and Education (FARE): foodallergy.org 3. Mothers of Asthmatics: http://www.asthmacommunitynetwork.org 4. American College of Allergy, Asthma, and Immunology: www.acaai.org   COVID-19 Vaccine Information can be found at: [email or web address] For questions related to vaccine distribution or appointments, please email [email or web address] or call [phone].   We realize that you might be concerned about having an allergic reaction to the COVID19 vaccines. To help with that concern, WE ARE OFFERING THE COVID19 VACCINES IN OUR OFFICE! Ask the front desk for dates!     "Like" [place] on Facebook and Instagram for our latest updates!      A healthy democracy works best when [employer] participate! Make sure you are registered to vote! If you have moved or changed any of your contact information, you will need to get this updated before voting!  In some cases, you MAY be able to register to vote online: [email or web address]

## 2023-07-31 NOTE — Progress Notes (Unsigned)
   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 Nickola Major)   Hyperthyroidism - improved on methimazole (followed by Dr. Roanna Raider)   Hypercholesterolemia - on simvastatin   GERD - controlled with famotidine  Plan/Recommendations:    There are no Patient Instructions on file for this visit.   Subjective:   Emmalee Kopec is a 72 y.o. female presenting today for follow up of No chief complaint on file.   Jaida Berrier has a history of the following: There are no problems to display for this patient.   History obtained from: chart review and {Persons; PED relatives w/patient:19415::"patient"}.  Jamielyn is a 72 y.o. female presenting for {Blank single:19197::"a food challenge","a drug challenge","skin testing","a sick visit","an evaluation of ***","a follow up visit"}.  She was last seen in August 2024.  At that time, the lung testing looked great.  She was doing very well with no controller medication.  We continue with albuterol as needed with Advair added 1 puff twice daily for 1 to 2 weeks during flares.  For her rhinitis, we continue with the use of Zyrtec as well as Flonase.  Since the last visit,   She had a cold earlier in the week. Then she had a sore throat and a non-productive cough. This lasted a few days. Then it all went into the lungs and she was not able to breathe. She did not test for COVID. She is now coughing up globs of mucous. She thinks that her symptoms started on Monday and it is generally getting worse.  {Blank single:19197::"Asthma/Respiratory Symptom History: ***"," "}  {Blank  single:19197::"Allergic Rhinitis Symptom History: ***"," "}  {Blank single:19197::"Food Allergy Symptom History: ***"," "}  {Blank single:19197::"Skin Symptom History: ***"," "}  {Blank single:19197::"GERD Symptom History: ***"," "}  Otherwise, there have been no changes to her past medical history, surgical history, family history, or social history.    Review of systems otherwise negative other than that mentioned in the HPI.    Objective:   There were no vitals taken for this visit. There is no height or weight on file to calculate BMI.    Physical Exam   Diagnostic studies:    Spirometry: results normal (FEV1: 1.66/92%, FVC: 2.24/96%, FEV1/FVC: 74%).    Spirometry consistent with normal pattern. {Blank single:19197::"Albuterol/Atrovent nebulizer","Xopenex/Atrovent nebulizer","Albuterol nebulizer","Albuterol four puffs via MDI","Xopenex four puffs via MDI"} treatment given in clinic with {Blank single:19197::"significant improvement in FEV1 per ATS criteria","significant improvement in FVC per ATS criteria","significant improvement in FEV1 and FVC per ATS criteria","improvement in FEV1, but not significant per ATS criteria","improvement in FVC, but not significant per ATS criteria","improvement in FEV1 and FVC, but not significant per ATS criteria","no improvement"}.  Allergy Studies: {Blank single:19197::"none","labs sent instead"," "}    {Blank single:19197::"Allergy testing results were read and interpreted by myself, documented by clinical staff."," "}      Malachi Bonds, MD  [facility]

## 2023-08-03 ENCOUNTER — Encounter (HOSPITAL_BASED_OUTPATIENT_CLINIC_OR_DEPARTMENT_OTHER): Payer: Self-pay

## 2023-08-03 ENCOUNTER — Inpatient Hospital Stay (HOSPITAL_BASED_OUTPATIENT_CLINIC_OR_DEPARTMENT_OTHER)
Admission: EM | Admit: 2023-08-03 | Discharge: 2023-08-10 | DRG: 062 | Disposition: A | Discharge status: Rehabilitation Facility | Payer: PPO | Attending: Neurology | Admitting: Neurology

## 2023-08-03 ENCOUNTER — Emergency Department (HOSPITAL_BASED_OUTPATIENT_CLINIC_OR_DEPARTMENT_OTHER): Payer: PPO

## 2023-08-03 ENCOUNTER — Other Ambulatory Visit: Payer: Self-pay

## 2023-08-03 ENCOUNTER — Encounter: Payer: Self-pay | Admitting: Allergy & Immunology

## 2023-08-03 DIAGNOSIS — J45909 Unspecified asthma, uncomplicated: Secondary | ICD-10-CM | POA: Diagnosis not present

## 2023-08-03 DIAGNOSIS — Z91048 Other nonmedicinal substance allergy status: Secondary | ICD-10-CM

## 2023-08-03 DIAGNOSIS — I498 Other specified cardiac arrhythmias: Secondary | ICD-10-CM | POA: Diagnosis not present

## 2023-08-03 DIAGNOSIS — E785 Hyperlipidemia, unspecified: Secondary | ICD-10-CM | POA: Diagnosis not present

## 2023-08-03 DIAGNOSIS — F419 Anxiety disorder, unspecified: Secondary | ICD-10-CM | POA: Diagnosis not present

## 2023-08-03 DIAGNOSIS — I6389 Other cerebral infarction: Secondary | ICD-10-CM | POA: Diagnosis not present

## 2023-08-03 DIAGNOSIS — Z791 Long term (current) use of non-steroidal anti-inflammatories (NSAID): Secondary | ICD-10-CM | POA: Diagnosis not present

## 2023-08-03 DIAGNOSIS — Z882 Allergy status to sulfonamides status: Secondary | ICD-10-CM | POA: Diagnosis not present

## 2023-08-03 DIAGNOSIS — E0590 Thyrotoxicosis, unspecified without thyrotoxic crisis or storm: Secondary | ICD-10-CM | POA: Diagnosis present

## 2023-08-03 DIAGNOSIS — F32A Depression, unspecified: Secondary | ICD-10-CM | POA: Diagnosis not present

## 2023-08-03 DIAGNOSIS — G8194 Hemiplegia, unspecified affecting left nondominant side: Secondary | ICD-10-CM | POA: Diagnosis present

## 2023-08-03 DIAGNOSIS — I69393 Ataxia following cerebral infarction: Secondary | ICD-10-CM | POA: Diagnosis not present

## 2023-08-03 DIAGNOSIS — E039 Hypothyroidism, unspecified: Secondary | ICD-10-CM | POA: Diagnosis not present

## 2023-08-03 DIAGNOSIS — R202 Paresthesia of skin: Secondary | ICD-10-CM | POA: Diagnosis not present

## 2023-08-03 DIAGNOSIS — Z7409 Other reduced mobility: Secondary | ICD-10-CM | POA: Diagnosis not present

## 2023-08-03 DIAGNOSIS — R29704 NIHSS score 4: Secondary | ICD-10-CM | POA: Diagnosis present

## 2023-08-03 DIAGNOSIS — Z7951 Long term (current) use of inhaled steroids: Secondary | ICD-10-CM | POA: Diagnosis not present

## 2023-08-03 DIAGNOSIS — I69354 Hemiplegia and hemiparesis following cerebral infarction affecting left non-dominant side: Secondary | ICD-10-CM | POA: Diagnosis not present

## 2023-08-03 DIAGNOSIS — N179 Acute kidney failure, unspecified: Secondary | ICD-10-CM | POA: Diagnosis not present

## 2023-08-03 DIAGNOSIS — Z79899 Other long term (current) drug therapy: Secondary | ICD-10-CM

## 2023-08-03 DIAGNOSIS — R7303 Prediabetes: Secondary | ICD-10-CM | POA: Diagnosis present

## 2023-08-03 DIAGNOSIS — E041 Nontoxic single thyroid nodule: Secondary | ICD-10-CM | POA: Diagnosis not present

## 2023-08-03 DIAGNOSIS — G459 Transient cerebral ischemic attack, unspecified: Secondary | ICD-10-CM | POA: Diagnosis present

## 2023-08-03 DIAGNOSIS — I639 Cerebral infarction, unspecified: Secondary | ICD-10-CM | POA: Diagnosis not present

## 2023-08-03 DIAGNOSIS — Z79890 Hormone replacement therapy (postmenopausal): Secondary | ICD-10-CM | POA: Diagnosis not present

## 2023-08-03 DIAGNOSIS — R200 Anesthesia of skin: Secondary | ICD-10-CM | POA: Diagnosis not present

## 2023-08-03 DIAGNOSIS — R531 Weakness: Secondary | ICD-10-CM | POA: Diagnosis not present

## 2023-08-03 DIAGNOSIS — K219 Gastro-esophageal reflux disease without esophagitis: Secondary | ICD-10-CM | POA: Diagnosis present

## 2023-08-03 DIAGNOSIS — E119 Type 2 diabetes mellitus without complications: Secondary | ICD-10-CM | POA: Diagnosis not present

## 2023-08-03 DIAGNOSIS — Z794 Long term (current) use of insulin: Secondary | ICD-10-CM | POA: Diagnosis not present

## 2023-08-03 DIAGNOSIS — R270 Ataxia, unspecified: Secondary | ICD-10-CM | POA: Diagnosis not present

## 2023-08-03 DIAGNOSIS — I6782 Cerebral ischemia: Secondary | ICD-10-CM | POA: Diagnosis not present

## 2023-08-03 DIAGNOSIS — Z7982 Long term (current) use of aspirin: Secondary | ICD-10-CM | POA: Diagnosis not present

## 2023-08-03 DIAGNOSIS — Z91013 Allergy to seafood: Secondary | ICD-10-CM

## 2023-08-03 DIAGNOSIS — Z9109 Other allergy status, other than to drugs and biological substances: Secondary | ICD-10-CM | POA: Diagnosis not present

## 2023-08-03 DIAGNOSIS — I161 Hypertensive emergency: Secondary | ICD-10-CM | POA: Diagnosis present

## 2023-08-03 DIAGNOSIS — I10 Essential (primary) hypertension: Secondary | ICD-10-CM | POA: Diagnosis present

## 2023-08-03 LAB — DIFFERENTIAL
Abs Immature Granulocytes: 0.23 10*3/uL — ABNORMAL HIGH (ref 0.00–0.07)
Basophils Absolute: 0 10*3/uL (ref 0.0–0.1)
Basophils Relative: 0 %
Eosinophils Absolute: 0 10*3/uL (ref 0.0–0.5)
Eosinophils Relative: 0 %
Immature Granulocytes: 2 %
Lymphocytes Relative: 11 %
Lymphs Abs: 1.2 10*3/uL (ref 0.7–4.0)
Monocytes Absolute: 0.4 10*3/uL (ref 0.1–1.0)
Monocytes Relative: 4 %
Neutro Abs: 9.5 10*3/uL — ABNORMAL HIGH (ref 1.7–7.7)
Neutrophils Relative %: 83 %

## 2023-08-03 LAB — URINALYSIS, ROUTINE W REFLEX MICROSCOPIC
Bilirubin Urine: NEGATIVE
Glucose, UA: 150 mg/dL — AB
Hgb urine dipstick: NEGATIVE
Ketones, ur: NEGATIVE mg/dL
Leukocytes,Ua: NEGATIVE
Nitrite: NEGATIVE
Protein, ur: NEGATIVE mg/dL
Specific Gravity, Urine: 1.039 — ABNORMAL HIGH (ref 1.005–1.030)
pH: 6 (ref 5.0–8.0)

## 2023-08-03 LAB — COMPREHENSIVE METABOLIC PANEL
ALT: 22 U/L (ref 0–44)
AST: 22 U/L (ref 15–41)
Albumin: 4.4 g/dL (ref 3.5–5.0)
Alkaline Phosphatase: 99 U/L (ref 38–126)
Anion gap: 12 (ref 5–15)
BUN: 22 mg/dL (ref 8–23)
CO2: 21 mmol/L — ABNORMAL LOW (ref 22–32)
Calcium: 9.8 mg/dL (ref 8.9–10.3)
Chloride: 105 mmol/L (ref 98–111)
Creatinine, Ser: 0.8 mg/dL (ref 0.44–1.00)
GFR, Estimated: >60 mL/min (ref >60)
Glucose, Bld: 252 mg/dL — ABNORMAL HIGH (ref 70–99)
Potassium: 3.8 mmol/L (ref 3.5–5.1)
Sodium: 138 mmol/L (ref 135–145)
Total Bilirubin: 0.3 mg/dL (ref 0.3–1.2)
Total Protein: 7.6 g/dL (ref 6.5–8.1)

## 2023-08-03 LAB — RAPID URINE DRUG SCREEN, HOSP PERFORMED
Amphetamines: NOT DETECTED
Barbiturates: NOT DETECTED
Benzodiazepines: NOT DETECTED
Cocaine: NOT DETECTED
Opiates: NOT DETECTED
Tetrahydrocannabinol: NOT DETECTED

## 2023-08-03 LAB — ETHANOL: Alcohol, Ethyl (B): <10 mg/dL (ref <10)

## 2023-08-03 LAB — CBC
HCT: 41.3 % (ref 36.0–46.0)
Hemoglobin: 13.6 g/dL (ref 12.0–15.0)
MCH: 27.9 pg (ref 26.0–34.0)
MCHC: 32.9 g/dL (ref 30.0–36.0)
MCV: 84.6 fL (ref 80.0–100.0)
Platelets: 315 10*3/uL (ref 150–400)
RBC: 4.88 MIL/uL (ref 3.87–5.11)
RDW: 13.5 % (ref 11.5–15.5)
WBC: 11.4 10*3/uL — ABNORMAL HIGH (ref 4.0–10.5)
nRBC: 0 % (ref 0.0–0.2)

## 2023-08-03 LAB — APTT: aPTT: 24 seconds (ref 24–36)

## 2023-08-03 LAB — CBG MONITORING, ED
Glucose-Capillary: 240 mg/dL — ABNORMAL HIGH (ref 70–99)
Glucose-Capillary: 231 mg/dL — ABNORMAL HIGH (ref 70–99)

## 2023-08-03 LAB — PROTIME-INR
INR: 1 (ref 0.8–1.2)
Prothrombin Time: 13 seconds (ref 11.4–15.2)

## 2023-08-03 MED ORDER — SENNOSIDES-DOCUSATE SODIUM 8.6-50 MG PO TABS: 1.0000 | ORAL_TABLET | Freq: Every evening | ORAL | Status: DC | PRN

## 2023-08-03 MED ORDER — CLOPIDOGREL BISULFATE 300 MG PO TABS
300.0000 mg | ORAL_TABLET | Freq: Every day | ORAL | Status: DC
  Administered 2023-08-03: 300 mg via ORAL
  Filled 2023-08-03: qty 1

## 2023-08-03 MED ORDER — CLEVIDIPINE BUTYRATE 0.5 MG/ML IV EMUL
0.0000 mg/h | INTRAVENOUS | Status: DC
  Administered 2023-08-04: 2 mg/h via INTRAVENOUS
  Filled 2023-08-03: qty 50

## 2023-08-03 MED ORDER — PANTOPRAZOLE SODIUM 40 MG IV SOLR
40.0000 mg | Freq: Every day | INTRAVENOUS | Status: DC
  Administered 2023-08-03 – 2023-08-04 (×2): 40 mg via INTRAVENOUS
  Filled 2023-08-03: qty 10

## 2023-08-03 MED ORDER — ASPIRIN 81 MG PO TBEC
81.0000 mg | DELAYED_RELEASE_TABLET | Freq: Every day | ORAL | Status: DC
  Administered 2023-08-03: 81 mg via ORAL
  Filled 2023-08-03: qty 1

## 2023-08-03 MED ORDER — IOHEXOL 350 MG/ML SOLN
100.0000 mL | Freq: Once | INTRAVENOUS | Status: AC | PRN
  Administered 2023-08-03: 75 mL via INTRAVENOUS

## 2023-08-03 MED ORDER — ACETAMINOPHEN 650 MG RE SUPP: 650.0000 mg | RECTAL | Status: DC | PRN

## 2023-08-03 MED ORDER — STROKE: EARLY STAGES OF RECOVERY BOOK
Freq: Once | Status: AC
  Filled 2023-08-03: qty 1

## 2023-08-03 MED ORDER — ACETAMINOPHEN 325 MG PO TABS
650.0000 mg | ORAL_TABLET | ORAL | Status: DC | PRN
  Filled 2023-08-03: qty 2

## 2023-08-03 MED ORDER — TENECTEPLASE FOR STROKE: 0.2500 mg/kg | PACK | Freq: Once | INTRAVENOUS | Status: AC

## 2023-08-03 MED ORDER — VENLAFAXINE HCL ER 75 MG PO CP24
75.0000 mg | ORAL_CAPSULE | Freq: Every day | ORAL | Status: DC
  Administered 2023-08-04 – 2023-08-10 (×7): 75 mg via ORAL
  Filled 2023-08-03 (×8): qty 1

## 2023-08-03 MED ORDER — TENECTEPLASE FOR STROKE
PACK | INTRAVENOUS | Status: AC
  Administered 2023-08-03: 16 mg via INTRAVENOUS
  Filled 2023-08-03: qty 10

## 2023-08-03 MED ORDER — CALCIUM CARBONATE-VITAMIN D 250-125 MG-UNIT PO TABS: 2.0000 | ORAL_TABLET | Freq: Every day | ORAL | Status: DC

## 2023-08-03 MED ORDER — IPRATROPIUM-ALBUTEROL 0.5-2.5 (3) MG/3ML IN SOLN
3.0000 mL | RESPIRATORY_TRACT | Status: DC | PRN
  Administered 2023-08-03 – 2023-08-04 (×2): 3 mL via RESPIRATORY_TRACT
  Filled 2023-08-03 (×2): qty 3

## 2023-08-03 MED ORDER — SIMVASTATIN 20 MG PO TABS: 10.0000 mg | ORAL_TABLET | Freq: Every day | ORAL | Status: DC

## 2023-08-03 MED ORDER — FAMOTIDINE 20 MG PO TABS
40.0000 mg | ORAL_TABLET | Freq: Every day | ORAL | Status: DC
  Administered 2023-08-03: 40 mg via ORAL
  Filled 2023-08-03: qty 2

## 2023-08-03 MED ORDER — ACETAMINOPHEN 160 MG/5ML PO SOLN: 650.0000 mg | ORAL | Status: DC | PRN

## 2023-08-03 NOTE — H&P (Signed)
NEUROLOGY CONSULTATION NOTE   Date of service: August 03, 2023 Patient Name: Priscilla Wilson MRN:  [id number] DOB:  [birth year] _ _ _   _ __   _ __ _ _  __ __   _ __   __ _  History of Present Illness  Priscilla Wilson is a 72 y.o. female with PMH significant for GERD, HLD, asthma who presents with acute onset left-sided numbness and incoordination.  Patient reports that she was sitting on a sofa and got up at around 5:30 PM when she stumbled because her left leg gave out.  She noticed that her left arm was also all tangled up and in the bowl when she noticed that her left lower face, tongue and left arm and left leg were all numb.  She called her husband who pulled the car up and drove her to [facility] ED.  Her symptoms had improved upon arrival to the ED with significant improvement in the numbness as well as weakness.  She reports that she had no trouble with the testing of her strength and coordination in the ED.  She reports that when she was taking for the CT scan, following the scan she felt that her symptoms had returned.  The ED doctor at [facility] called me and I requested reactivating a code stroke.  Patient was emergently evaluated by teleneurology and she was given TNKase.  Of note, during transport, her blood pressure momentarily was above goal at around 185 systolic.  Her heart rate was in 50s to 60s.  I got a call from [facility] and route and I requested that they were administer 10 mg of IV hydralazine x 1.  Her blood pressure was down to 160 systolic within a few minutes.  Patient denies any prior personal history of stroke.  She reports that her brother was recently diagnosed with a TIA.  Patient does not smoke, does not use any recreational substances, does not drink any alcohol.  She denies any history of diabetes.  LKW: 1730 on 08/03/23 mRS: 0 tNKASE: given at [facility] ED. Thrombectomy: not offered, 2/2 no LVO NIHSS components Score: Comment  1a Level of  Conscious 0[x]  1[]  2[]  3[]      1b LOC Questions 0[x]  1[]  2[]       1c LOC Commands 0[x]  1[]  2[]       2 Best Gaze 0[x]  1[]  2[]       3 Visual 0[x]  1[]  2[]  3[]      4 Facial Palsy 0[x]  1[]  2[]  3[]      5a Motor Arm - left 0[x]  1[]  2[]  3[]  4[]  UN[]    5b Motor Arm - Right 0[x]  1[]  2[]  3[]  4[]  UN[]    6a Motor Leg - Left 0[x]  1[]  2[]  3[]  4[]  UN[]    6b Motor Leg - Right 0[x]  1[]  2[]  3[]  4[]  UN[]    7 Limb Ataxia 0[]  1[]  2[x]  3[]  UN[]     8 Sensory 0[]  1[]  2[x]  UN[]      9 Best Language 0[x]  1[]  2[]  3[]      10 Dysarthria 0[x]  1[]  2[]  UN[]      11 Extinct. and Inattention 0[x]  1[]  2[]       TOTAL: 4     ROS   Constitutional Denies weight loss, fever and chills.   HEENT Denies changes in vision and hearing.   Respiratory Denies SOB and cough.   CV Denies palpitations and CP   GI Denies abdominal pain, nausea, vomiting and diarrhea.   GU Denies dysuria and urinary frequency.  MSK Denies myalgia and joint pain.   Skin Denies rash and pruritus.   Neurological Denies headache and syncope.   Psychiatric Denies recent changes in mood. Denies anxiety and depression.    Past History  History reviewed. No pertinent past medical history. Past Surgical History:  Procedure Laterality Date   BREAST EXCISIONAL BIOPSY Left    benign   Family History  Problem Relation Age of Onset   Cancer - Ovarian Sister    Social History   Socioeconomic History   Marital status: Married    Spouse name: Not on file   Number of children: Not on file   Years of education: Not on file   Highest education level: Not on file  Occupational History   Not on file  Tobacco Use   Smoking status: Never    Passive exposure: Past   Smokeless tobacco: Never  Vaping Use   Vaping status: Never Used  Substance and Sexual Activity   Alcohol use: Not Currently   Drug use: Never   Sexual activity: Yes  Other Topics Concern   Not on file  Social History Narrative   Not on file   Social Determinants of Health    Financial Resource Strain: Not on file  Food Insecurity: Low Risk  (02/26/2023)   [facility]   Hunger Vital Sign    Worried About Running Out of Food in the Last Year: Never true    Ran Out of Food in the Last Year: Never true  Transportation Needs: No Transportation Needs (02/26/2023)   [facility]   Transportation    In the past 12 months, has lack of reliable transportation kept you from medical appointments, meetings, work or from getting things needed for daily living? : No  Physical Activity: Not on file  Stress: Not on file  Social Connections: Not on file   Allergies  Allergen Reactions   Cat Hair Extract Hives, Shortness Of Breath, Itching and Swelling   Shellfish Allergy Hives and Itching   Sulfa Antibiotics Rash    Medications  (Not in a hospital admission)    Vitals   Vitals:   08/03/23 2115 08/03/23 2130 08/03/23 2145 08/03/23 2215  BP: (!) 155/69 (!) 152/69 (!) 180/81 (!) 159/80  Pulse: 65 65 64 76  Resp: (!) 21 18 19 20   Temp:    98.2 F (36.8 C)  TempSrc:    Oral  SpO2: 96% 98% 97% 98%  Weight:      Height:         Body mass index is 26.91 kg/m.  Physical Exam   General: Laying comfortably in bed; in no acute distress.  HENT: Normal oropharynx and mucosa. Normal external appearance of ears and nose.  Neck: Supple, no pain or tenderness  CV: No JVD. No peripheral edema.  Pulmonary: Symmetric Chest rise. Normal respiratory effort.  Abdomen: Soft to touch, non-tender.  Ext: No cyanosis, edema, or deformity  Skin: No rash. Normal palpation of skin.   Musculoskeletal: Normal digits and nails by inspection. No clubbing.   Neurologic Examination  Mental status/Cognition: Alert, oriented to self, place, month and year, good attention.  Speech/language: Fluent, comprehension intact, object naming intact, repetition intact.  Cranial nerves:   CN II Pupils equal and reactive to light, no  VF deficits    CN III,IV,VI EOM intact, no gaze preference or deviation, no nystagmus    CN V normal sensation in V1, V2, and  V3 segments bilaterally    CN VII no asymmetry, no nasolabial fold flattening    CN VIII normal hearing to speech    CN IX & X normal palatal elevation, no uvular deviation    CN XI 5/5 head turn and 5/5 shoulder shrug bilaterally    CN XII midline tongue protrusion    Motor:  Muscle bulk: normal, tone normal, pronator drift none tremor none Mvmt Root Nerve  Muscle Right Left Comments  SA C5/6 Ax Deltoid 5 5   EF C5/6 Mc Biceps 5 5   EE C6/7/8 Rad Triceps 5 5   WF C6/7 Med FCR     WE C7/8 PIN ECU     F Ab C8/T1 U ADM/FDI 5 5   HF L1/2/3 Fem Illopsoas 5 4+   KE L2/3/4 Fem Quad 5 5   DF L4/5 D Peron Tib Ant 5 5   PF S1/2 Tibial Grc/Sol 5 5    Sensation:  Light touch Slightly decreased in left lower face, left upper extremity and left lower extremity to light touch.   Pin prick    Temperature    Vibration   Proprioception    Coordination/Complex Motor:  - Finger to Nose ith ataxia in LUE - Heel to shin with ataxia in LLE - Rapid alternating movement are slowed on the left - Gait: Deferred for patient safety. Labs   CBC:  Recent Labs  Lab 08/03/23 1809  WBC 11.4*  NEUTROABS 9.5*  HGB 13.6  HCT 41.3  MCV 84.6  PLT 315    Basic Metabolic Panel:  Lab Results  Component Value Date   NA 138 08/03/2023   K 3.8 08/03/2023   CO2 21 (L) 08/03/2023   GLUCOSE 252 (H) 08/03/2023   BUN 22 08/03/2023   CREATININE 0.80 08/03/2023   CALCIUM 9.8 08/03/2023   GFRNONAA >60 08/03/2023   Lipid Panel: No results found for: "LDLCALC" HgbA1c: No results found for: "HGBA1C" Urine Drug Screen: No results found for: "LABOPIA", "COCAINSCRNUR", "LABBENZ", "AMPHETMU", "THCU", "LABBARB"  Alcohol Level     Component Value Date/Time   ETH <10 08/03/2023 1829   INR  Lab Results  Component Value Date   INR 1.0 08/03/2023   APTT  Lab Results  Component  Value Date   APTT 24 08/03/2023     CT Head without contrast(Personally reviewed): CTH was negative for a large hypodensity concerning for a large territory infarct or hyperdensity concerning for an ICH  CT angio Head and Neck with contrast(Personally reviewed): No LVO  MRI Brain(Personally reviewed): Pending  Impression   Priscilla Wilson is a 72 y.o. female with PMH significant for asthma, hyperlipidemia who presents with left upper extremity and left lower extremity incoordination and numbness.  She was given TNKase at [facility] for disabling deficit and transferred to [facility] for admission post tnkase to the ICU.  Primary Diagnosis:  Cerebral infarction, unspecified.  Secondary Diagnosis: Essential (primary) hypertension  Recommendations  - Frequent NeuroChecks for post tNK care per stroke unit protocol: - Initial CTH demonstrated no acute hemorrhage or mass - MRI Brain - pending - CTA - no LVO - TTE - pending - Lipid Panel: LDL - pending  - Statin: cont home statin for now. - HbA1c: pending. - Antithrombotic: Start ASA 81 mg daily if 24 h CTH does not show acute hemorrhage - DVT prophylaxis: SCDs. Pharmacologic prophylaxis if 24 h CTH does not demonstrate acute hemorrhage - Systolic Blood Pressure goal: < 180 mm Hg -  Telemetry monitoring for arrhythmia: 72 hours - Swallow screen - ordered - PT/OT/SLP consults  Asthma: - feels SOB and wheezy at this time. Duoneb PRN Q4H ordered for now.  HLD: - LDL pending - cont home statin.  GERD: - cont home famotidine.  CODE STATUS verified and patient is full code. She would like her husband to be HCPOA. Allergies verified and updated.  This patient is critically ill and at significant risk of neurological worsening, death and care requires constant monitoring of vital signs, hemodynamics,respiratory and cardiac monitoring, neurological assessment, discussion with family, other specialists and medical decision  making of high complexity. I spent 70 minutes of neurocritical care time  in the care of  this patient. This was time spent independent of any time provided by nurse practitioner or PA.  Erick Blinks [facility] 08/03/2023  10:54 PM   ______________________________________________________________________   Thank you for the opportunity to take part in the care of this patient. If you have any further questions, please contact the neurology consultation attending.  Signed,  Erick Blinks [facility] _ _ _   _ __   _ __ _ _  __ __   _ __   __ _

## 2023-08-03 NOTE — ED Triage Notes (Addendum)
Patient arrives with complaints of sudden onset of left side weakness that started ~30 minutes prior to arrival. Patient states that when she got up from her couch, her left leg and arm were weak. She was also reporting tingling in her face.  Patient spouse states that she was stumbling across the room as well when she tried to get up.  ---Dr. Elayne Snare in triage to assess patient

## 2023-08-03 NOTE — ED Notes (Signed)
Dr. Izora Gala with Neurology at bedside

## 2023-08-03 NOTE — Consult Note (Signed)
[facility] Tele[facility]urology Consult Services   Patient Name:   Priscilla Wilson, Amsler Date of Birth:   [birth year] Identification Number:   MRN - [id number] Date of Service:   08/03/2023 78:29:56  Diagnosis:       I63.89 - Cerebrovascular accident (CVA) due to other mechanism [facility])  Impression:      72y/o woman w/ vascular risk factors presenting for evaluation after having sudden onset of Lt sided numbness/paresthesias, followed by weakness and ataxia, onset 1700, NIHSS:5, head CT w/ no acute findings, there were no clear contraindications for IV thrombolytics on risks vs benefits discussion w/ patient and family member at bedside, benefits>risks on her case as her symptoms are disabling, they understood and agreed, IV TNK administered, CTA head and neck w/ no LVO in my review, no indications for acute NIR. Patient to be transferred for ICU admission, IV TNK protocol and stroke work up.  Our recommendations are outlined below. Recommendations: IV Alteplase recommended.  I confirmed the following. (Patient name, DOB, MRN, Blood Pressure, dose of Thrombolytic and waste, weight completed by stretcher/scale not stated weight, have ED staff inform ED MD of thrombolytic decision) Thrombolytic bolus given Without Complication.   IV Tenecteplase 16mg    Routine post Thrombolytic monitoring including neuro checks and blood pressure control during/after treatment Monitor blood pressure Check blood pressure and neuro assessment every 15 min for 2 h, then every 30 min for 6 h, and finally every hour for 16 h.  Manage Blood Pressure per post Thrombolytic protocol.        Follow designated hospital protocol for admission and post thrombolytic care       CT brain 24 hours post Thrombolytic       NPO until swallowing screen performed and passed       No antiplatelet agents or anticoagulants (including heparin for DVT prophylaxis) in first 24 hours       No Foley catheter, nasogastric  tube, arterial catheter or central venous catheter for 24 hr, unless absolutely necessary       Telemetry       Bedside swallow evaluation       HOB less than 30 degrees       Euglycemia       Avoid hyperthermia, PRN acetaminophen       DVT prophylaxis       Inpatient Neurology Consultation       Stroke evaluation as per inpatient neurology recommendations  Discussed with ED physician    ------------------------------------------------------------------------------  Advanced Imaging: CTA Head and Neck Completed.  LVO:No  Patient in not a candidate for NIR   Metrics: Last Known Well: 08/03/2023 17:00:00 [facility] Notification Time: 08/03/2023 21:30:86 Arrival Time: 08/03/2023 17:57:00 Stamp Time: 08/03/2023 57:84:69 Initial Response Time: 08/03/2023 20:30:21 Symptoms: Lt sided weakness/numbness. Initial patient interaction: 08/03/2023 20:33:18 NIHSS Assessment Completed: 08/03/2023 20:41:08 Patient is a candidate for Thrombolytic. Thrombolytic Medical Decision: 08/03/2023 20:44:03 Needle Time: 08/03/2023 20:51:00 Weight Noted by Staff: 62.5 kg  CT head showed no acute hemorrhage or acute core infarct.  Primary Provider Notified of Diagnostic Impression and Management Plan on: 08/03/2023 20:56:07    ------------------------------------------------------------------------------  Thrombolytic Contraindications:  Last Known Well > 4.5 hours: No CT Head showing hemorrhage: No Ischemic stroke within 3 months: No Severe head trauma within 3 months: No Intracranial/intraspinal surgery within 3 months: No History of intracranial hemorrhage: No Symptoms and signs consistent with an SAH: No GI malignancy or GI bleed within 21 days: No Coagulopathy: Platelets <100 000 /mm3, INR >1.7, aPTT>40 s,  or PT >15 s: No Treatment dose of LMWH within the previous 24 hrs: No Use of NOACs in past 48 hours: No Glycoprotein IIb/IIIa receptor inhibitors use: No Symptoms  consistent with infective endocarditis: No Suspected aortic arch dissection: No Intra-axial intracranial neoplasm: No  Thrombolytic Decision and Management Plan: Management with thrombolytic treatment was explained to the Patient and Family as was risks and benefits and alternatives to the treatment. Patient agrees with the decision to proceed with thrombolytic treatment. . All questions were answered and the Patient and Family expressed understanding of the treatment plan.   History of Present Illness:  Patient was brought by private transportation with symptoms of Lt sided weakness/numbness. 72y/o woman w/ hyperthyroidism, arthritis, depression/anxiety, HLD, HTN presenting for evaluation after having sudden onset of Lt sided weakness and numbness, onset approx 1700. Initially patient only complained of Lt leg paresthesias/numbness, then while in the CT scan in ED experienced dizziness and worsening of symptoms and weakness was noted on exam, stroke alert was called.   Past Medical History:      Hypertension      Hyperlipidemia  Medications:  No Anticoagulant use  No Antiplatelet use Reviewed EMR for current medications  Allergies:  Reviewed  Social History: Smoking: No  Family History:  There is no family history of premature cerebrovascular disease pertinent to this consultation  ROS : 14 Points Review of Systems was performed and was negative except mentioned in HPI.  Past Surgical History: There Is No Surgical History Contributory To Today's Visit   Examination: BP(183/92), Pulse(69), Blood Glucose(240) 1A: Level of Consciousness - Alert; keenly responsive + 0 1B: Ask Month and Age - Both Questions Right + 0 1C: Blink Eyes & Squeeze Hands - Performs Both Tasks + 0 2: Test Horizontal Extraocular Movements - Normal + 0 3: Test Visual Fields - No Visual Loss + 0 4: Test Facial Palsy (Use Grimace if Obtunded) - Normal symmetry + 0 5A: Test Left Arm Motor Drift -  Drift, but doesn't hit bed + 1 5B: Test Right Arm Motor Drift - No Drift for 10 Seconds + 0 6A: Test Left Leg Motor Drift - Drift, but doesn't hit bed + 1 6B: Test Right Leg Motor Drift - No Drift for 5 Seconds + 0 7: Test Limb Ataxia (FNF/Heel-Shin) - Ataxia in 2 Limbs + 2 8: Test Sensation - Mild-Moderate Loss: Less Sharp/More Dull + 1 9: Test Language/Aphasia - Normal; No aphasia + 0 10: Test Dysarthria - Normal + 0 11: Test Extinction/Inattention - No abnormality + 0  NIHSS Score: 5 NIHSS Free Text : decreased sensation in Lt side of face, Lt arm and Lt leg  Pre-Morbid Modified Rankin Scale: 0 Points = No symptoms at all  Spoke with : Dr. Elayne Snare at bedside  This consult was conducted in real time using interactive audio and Immunologist. Patient was informed of the technology being used for this visit and agreed to proceed. Patient located in hospital and provider located at home/office setting.   Patient is being evaluated for possible acute neurologic impairment and high probability of imminent or life-threatening deterioration. I spent total of 50 minutes providing care to this patient, including time for face to face visit via telemedicine, review of medical records, imaging studies and discussion of findings with providers, the patient and/or family.   Dr Josem Kaufmann Gaspar Bidding   [facility] For Inpatient follow-up with [facility] physician please call [facility] [phone]. This is not an outpatient service. Post hospital discharge,  please contact hospital directly.  Please do not communicate with [facility] physicians via secure chat. If you have any questions, Please contact [facility]. Please call or reconsult our service if there are any clinical or diagnostic changes.

## 2023-08-03 NOTE — ED Notes (Signed)
Patient arrived from [facility] ER, patient in hospital bed for comfort.

## 2023-08-03 NOTE — ED Notes (Signed)
Code stroke re called due to new symptoms

## 2023-08-03 NOTE — Progress Notes (Signed)
Telestroke cart was activated at 2025. Per treatment team, pt's LKW was at 1930 with c/o L sided weakness and dysmetria. Bedside team reported that pt's symptoms were worse than what patient initially presented to the ED. Dr. Elayne Snare, EDP, already assessed pt prior to cart activation. Pt was transported to and from CT prior to cart activation. TSMD was paged for code stroke at 2026. Dr. Joselyn Arrow, TSMD appeared on telestroke cart at 2030 to assess the patient. Per Dr. Joselyn Arrow, TSMD, pt met criteria for TNK administration. TSMD explained the risks/benefits of TNK adminstration to pt at 2042. Pt indicated that they understood and consented to TNK administration at 2044. A time out was called at 2051. Pt's actual weight of 62.5kg was confirmed by primary RN. There was a delay in TNK administration 2/2 bedside team mixing the TNK and rechecking BP. Pt was given a TNK bolus of 16mg  by Shanda Bumps, RN at 2051. Pt was informed to notify treatment team of worsening of symptoms, headache, nausea, or signs of an allergic reaction. Pt indicated that they understood. Telestroke RN remained on telestroke cart with pt for post TNK administration monitoring. Pt remains on continuous cardiac, BP, and pulse ox monitoring for ongoing assessment. Telestroke RN completed post TNK monitoring of patient. NAD was noted at this time. Primary RN remains at bedside for ongoing monitoring. Pt pending ICU bed at this time. No further needs from [employer]. Telestroke cart was disconnected at 2128.

## 2023-08-03 NOTE — ED Provider Notes (Signed)
[place] EMERGENCY DEPARTMENT AT [facility] Provider Note   CSN: [id number] Arrival date & time: 08/03/23  1757     History  Chief Complaint  Patient presents with   Extremity Weakness    Left    Priscilla Wilson is a 72 y.o. female.  With past medical history of hypothyroidism and hyperlipidemia presents to the ED given concern for stroke.  Patient reports acute onset left facial numbness along with left upper extremity and left lower extremity weakness beginning at 1730 at home tonight.  Her symptoms persisted since the onset prompting her husband to bring her to the ED for further evaluation.  Here in the ED she continues to report numbness on the left side of her face but states her left upper extremity and left lower extremity weakness has improved both still feel heavy.  Denies headaches, chest pain, shortness of breath, nausea, vomiting, fevers, chills and recent illness.  No prior history of stroke.  Patient is not on anticoagulation therapy   Extremity Weakness       Home Medications Prior to Admission medications   Medication Sig Start Date End Date Taking? Authorizing Provider  albuterol (VENTOLIN HFA) 108 (90 Base) MCG/ACT inhaler Inhale 4 puffs into the lungs every 4 (four) hours as needed for wheezing or shortness of breath. 07/02/21   Alfonse Spruce, MD  Calcium Citrate-Vitamin D 315-250 MG-UNIT TABS 1 tablet    [provider]  calcium-vitamin D (OSCAL WITH D) 250-125 MG-UNIT tablet Take 2 tablets by mouth daily.    [provider]  famotidine (PEPCID) 40 MG tablet Take 1 tablet (40 mg total) by mouth at bedtime. 06/30/23   Alfonse Spruce, MD  meloxicam (MOBIC) 15 MG tablet Take 15 mg by mouth daily.    [provider]  methimazole (TAPAZOLE) 10 MG tablet 1 tablet Orally Once a day    [provider]  Multiple Vitamin (MULTI VITAMIN) TABS 1 tablet    [provider]  predniSONE (DELTASONE) 10 MG  tablet Take 3 tabs (30mg ) twice daily for 3 days, then 2 tabs (20mg ) twice daily for 3 days, then 1 tab (10mg ) twice daily for 3 days, then STOP. 07/31/23   Alfonse Spruce, MD  simvastatin (ZOCOR) 10 MG tablet Take 10 mg by mouth daily.    [provider]  venlafaxine XR (EFFEXOR-XR) 75 MG 24 hr capsule 1 capsule with food    [provider]      Allergies    Cat hair extract, Shellfish allergy, and Sulfa antibiotics    Review of Systems   Review of Systems  Musculoskeletal:  Positive for extremity weakness.  Neurological:  Positive for weakness and numbness.  All other systems reviewed and are negative.   Physical Exam Updated Vital Signs BP (!) 180/81   Pulse 64   Temp 98.1 F (36.7 C)   Resp 19   Ht 5' (1.524 m)   Wt 62.5 kg   SpO2 97%   BMI 26.91 kg/m  Physical Exam Vitals and nursing note reviewed.  HENT:     Head: Normocephalic and atraumatic.  Eyes:     Pupils: Pupils are equal, round, and reactive to light.  Cardiovascular:     Rate and Rhythm: Normal rate and regular rhythm.  Pulmonary:     Effort: Pulmonary effort is normal.     Breath sounds: Normal breath sounds.  Abdominal:     Palpations: Abdomen is soft.     Tenderness:  There is no abdominal tenderness.  Skin:    General: Skin is warm and dry.  Neurological:     General: No focal deficit present.     Mental Status: She is alert and oriented to person, place, and time. Mental status is at baseline.     Cranial Nerves: No cranial nerve deficit.     Sensory: No sensory deficit.     Comments: 5 out of 5 motor strength in all 4 extremities with no pronator drift Clear fluent speech Sensation intact to light touch throughout  Psychiatric:        Mood and Affect: Mood normal.     ED Results / Procedures / Treatments   Labs (all labs ordered are listed, but only abnormal results are displayed) Labs Reviewed  CBC - Abnormal; Notable for the following components:      Result  Value   WBC 11.4 (*)    All other components within normal limits  DIFFERENTIAL - Abnormal; Notable for the following components:   Neutro Abs 9.5 (*)    Abs Immature Granulocytes 0.23 (*)    All other components within normal limits  COMPREHENSIVE METABOLIC PANEL - Abnormal; Notable for the following components:   CO2 21 (*)    Glucose, Bld 252 (*)    All other components within normal limits  CBG MONITORING, ED - Abnormal; Notable for the following components:   Glucose-Capillary 240 (*)    All other components within normal limits  CBG MONITORING, ED - Abnormal; Notable for the following components:   Glucose-Capillary 231 (*)    All other components within normal limits  ETHANOL  PROTIME-INR  APTT  RAPID URINE DRUG SCREEN, HOSP PERFORMED  URINALYSIS, ROUTINE W REFLEX MICROSCOPIC    EKG None  Radiology CT ANGIO HEAD NECK W WO CM (CODE STROKE)  Result Date: 08/03/2023 CLINICAL DATA:  Stroke versus transient ischemic attack. Left-sided extremity weakness, facial tingling and numbness. EXAM: CT ANGIOGRAPHY HEAD AND NECK WITH AND WITHOUT CONTRAST TECHNIQUE: Multidetector CT imaging of the head and neck was performed using the standard protocol during bolus administration of intravenous contrast. Multiplanar CT image reconstructions and MIPs were obtained to evaluate the vascular anatomy. Carotid stenosis measurements (when applicable) are obtained utilizing NASCET criteria, using the distal internal carotid diameter as the denominator. RADIATION DOSE REDUCTION: This exam was performed according to the departmental dose-optimization program which includes automated exposure control, adjustment of the mA and/or kV according to patient size and/or use of iterative reconstruction technique. CONTRAST:  75mL OMNIPAQUE IOHEXOL 350 MG/ML SOLN COMPARISON:  Head CT 08/03/2023. FINDINGS: CTA NECK FINDINGS Aortic arch: Two-vessel arch configuration with common origin of the right brachiocephalic and  left common carotid arteries. Arch vessel origins are patent. Right carotid system: No evidence of dissection, stenosis (50% or greater), or occlusion. Left carotid system: No evidence of dissection, stenosis (50% or greater), or occlusion. Vertebral arteries: Codominant. No evidence of dissection, stenosis (50% or greater), or occlusion. Skeleton: Mild cervical spondylosis without high-grade spinal canal stenosis. Other neck: 1.8 cm right thyroid nodule (axial image 27 series 4). Upper chest: Unremarkable. Review of the MIP images confirms the above findings CTA HEAD FINDINGS Anterior circulation: Intracranial ICAs are patent without stenosis or aneurysm. The proximal ACAs and MCAs are patent without stenosis or aneurysm. Distal branches are symmetric. Posterior circulation: Normal basilar artery. The SCAs, AICAs and PICAs are patent proximally. The PCAs are patent proximally without stenosis or aneurysm. Distal branches are symmetric. Venous sinuses: As permitted  by contrast timing, patent. Anatomic variants: Hypoplastic right A1 segment. Review of the MIP images confirms the above findings IMPRESSION: 1. No large vessel occlusion, hemodynamically significant stenosis, or aneurysm in the head or neck. 2. Right thyroid nodule, 1.8 cm. Recommend nonurgent thyroid US (ref: J Am Coll Radiol. 2015 Feb;12(2): 143-50). Electronically Signed   By: Orvan Falconer M.D.   On: 08/03/2023 18:43   CT HEAD CODE STROKE WO CONTRAST`  Result Date: 08/03/2023 CLINICAL DATA:  Code stroke. Stroke versus transient ischemic attack. Left-sided weakness. EXAM: CT HEAD WITHOUT CONTRAST TECHNIQUE: Contiguous axial images were obtained from the base of the skull through the vertex without intravenous contrast. RADIATION DOSE REDUCTION: This exam was performed according to the departmental dose-optimization program which includes automated exposure control, adjustment of the mA and/or kV according to patient size and/or use of iterative  reconstruction technique. COMPARISON:  None Available. FINDINGS: Brain: No acute hemorrhage. Cortical gray-white differentiation is preserved. Patchy hypoattenuation of the cerebral white matter, most consistent with mild chronic small-vessel disease. Prominence of the ventricles and sulci within normal limits for age. No extra-axial collection. Basilar cisterns are patent. Vascular: No hyperdense vessel or unexpected calcification. Skull: No calvarial fracture or suspicious bone lesion. Skull base is unremarkable. Sinuses/Orbits: No acute finding. Other: None. ASPECTS [facility] Stroke Program Early CT Score) - Ganglionic level infarction (caudate, lentiform nuclei, internal capsule, insula, M1-M3 cortex): 7 - Supraganglionic infarction (M4-M6 cortex): 3 Total score (0-10 with 10 being normal): 10 IMPRESSION: 1. No acute intracranial hemorrhage or evidence of acute large vessel territory infarct. ASPECT score is 10. 2. Mild chronic small-vessel disease. Code stroke imaging results were communicated on 08/03/2023 at 6:23 pm to provider Dr. Elayne Snare via telephone, who verbally acknowledged these results. Electronically Signed   By: Orvan Falconer M.D.   On: 08/03/2023 18:23    Procedures .Critical Care  Performed by: Royanne Foots, DO Authorized by: Royanne Foots, DO   Critical care provider statement:    Critical care time (minutes):  40   Critical care time was exclusive of:  Separately billable procedures and treating other patients and teaching time   Critical care was necessary to treat or prevent imminent or life-threatening deterioration of the following conditions:  CNS failure or compromise   Critical care was time spent personally by me on the following activities:  Blood draw for specimens, ordering and performing treatments and interventions, discussions with consultants, obtaining history from patient or surrogate, review of old charts, examination of patient, re-evaluation of patient's  condition, evaluation of patient's response to treatment, pulse oximetry, ordering and review of radiographic studies, ordering and review of laboratory studies and development of treatment plan with patient or surrogate   I assumed direction of critical care for this patient from another provider in my specialty: no     Care discussed with: admitting provider and accepting provider at another facility   Comments:     Care discussed with [facility] neurology team as well as telemetry neurologist Dr. Hadassah Pais ED to ED transfer.  Accepting physician Dr. Wilkie Aye     Medications Ordered in ED Medications  clopidogrel (PLAVIX) tablet 300 mg (300 mg Oral Given 08/03/23 1853)  aspirin EC tablet 81 mg (81 mg Oral Given 08/03/23 1853)  iohexol (OMNIPAQUE) 350 MG/ML injection 100 mL (75 mLs Intravenous Contrast Given 08/03/23 1825)  tenecteplase (TNKASE) injection for Stroke 16 mg (16 mg Intravenous Given 08/03/23 2052)    ED Course/ Medical Decision Making/ A&P  Medical Decision Making 72 year old female with history as above presenting given concern for acute onset stroke.  Last known well time of 1730.  Symptoms include left facial numbness along with left upper extremity and left lower extremity weakness.  Initial vital signs notable for hypertension.  Upon my initial assessment patient had no focal neurologic deficits.  At that time this was most likely representative of a TIA given the rapid improvement in her symptoms.  We obtained CT head without contrast as well as CTA of the head and neck which showed no acute intracranial hemorrhage or large vessel occlusion.  Shortly after the patient was transported from CT she reported a return of her symptoms including left facial numbness and left upper extremity left lower extremity weakness.  A code stroke was not activated considering this acute change in the patient was examined along with Dr. Hadassah Pais (neurotelemetry).  Repeat  neurologic exam at that time did reveal persistent deficits and the decision was made to proceed with thrombolytics (tenecteplase) to treat likely ischemic stroke.  The risks and benefits were discussed with this patient and her husband at bedside and verbal emergent consent was obtained.  Weight-based dosing of tenecteplase was administered in the ED without acute complication.  The patient remained with her airway intact and hemodynamically stable.  We have arranged for transfer to the [facility] ED in [place] as there are no ICU beds immediately available.  Accepting ED physician Dr. Wilkie Aye  Amount and/or Complexity of Data Reviewed Labs: ordered. Radiology: ordered.  Risk OTC drugs. Prescription drug management. Decision regarding hospitalization.           Final Clinical Impression(s) / ED Diagnoses Final diagnoses:  Ischemic cerebrovascular accident (CVA) [facility])    Rx / DC Orders ED Discharge Orders     None         Royanne Foots, DO 08/03/23 2153

## 2023-08-04 ENCOUNTER — Inpatient Hospital Stay (HOSPITAL_COMMUNITY): Payer: PPO

## 2023-08-04 DIAGNOSIS — I6389 Other cerebral infarction: Secondary | ICD-10-CM | POA: Diagnosis not present

## 2023-08-04 DIAGNOSIS — G459 Transient cerebral ischemic attack, unspecified: Secondary | ICD-10-CM | POA: Diagnosis not present

## 2023-08-04 LAB — HEMOGLOBIN A1C
Hgb A1c MFr Bld: 6.3 % — ABNORMAL HIGH (ref 4.8–5.6)
Mean Plasma Glucose: 134.11 mg/dL

## 2023-08-04 LAB — LIPID PANEL
Cholesterol: 166 mg/dL (ref 0–200)
HDL: 62 mg/dL (ref >40)
LDL Cholesterol: 88 mg/dL (ref 0–99)
Total CHOL/HDL Ratio: 2.7 RATIO
Triglycerides: 78 mg/dL (ref <150)
VLDL: 16 mg/dL (ref 0–40)

## 2023-08-04 LAB — MRSA NEXT GEN BY PCR, NASAL: MRSA by PCR Next Gen: NOT DETECTED

## 2023-08-04 LAB — ECHOCARDIOGRAM COMPLETE
Height: 60 in
Weight: 2204.8 oz

## 2023-08-04 LAB — GLUCOSE, CAPILLARY
Glucose-Capillary: 111 mg/dL — ABNORMAL HIGH (ref 70–99)
Glucose-Capillary: 151 mg/dL — ABNORMAL HIGH (ref 70–99)
Glucose-Capillary: 155 mg/dL — ABNORMAL HIGH (ref 70–99)
Glucose-Capillary: 91 mg/dL (ref 70–99)

## 2023-08-04 LAB — T4, FREE: Free T4: 0.94 ng/dL (ref 0.61–1.12)

## 2023-08-04 LAB — TSH: TSH: 0.286 u[IU]/mL — ABNORMAL LOW (ref 0.350–4.500)

## 2023-08-04 MED ORDER — ORAL CARE MOUTH RINSE: 15.0000 mL | OROMUCOSAL | Status: DC | PRN

## 2023-08-04 MED ORDER — CHLORHEXIDINE GLUCONATE CLOTH 2 % EX PADS
6.0000 | MEDICATED_PAD | Freq: Every day | CUTANEOUS | Status: DC
  Administered 2023-08-04 – 2023-08-06 (×3): 6 via TOPICAL

## 2023-08-04 MED ORDER — HYDRALAZINE HCL 20 MG/ML IJ SOLN
10.0000 mg | INTRAMUSCULAR | Status: DC | PRN
  Administered 2023-08-04: 10 mg via INTRAVENOUS
  Filled 2023-08-04: qty 1

## 2023-08-04 MED ORDER — LABETALOL HCL 5 MG/ML IV SOLN
10.0000 mg | INTRAVENOUS | Status: DC | PRN
  Administered 2023-08-04 (×2): 10 mg via INTRAVENOUS
  Filled 2023-08-04 (×2): qty 4

## 2023-08-04 MED ORDER — SIMVASTATIN 20 MG PO TABS
40.0000 mg | ORAL_TABLET | Freq: Every day | ORAL | Status: DC
  Administered 2023-08-04 – 2023-08-08 (×5): 40 mg via ORAL
  Filled 2023-08-04 (×5): qty 2

## 2023-08-04 MED ORDER — METOPROLOL TARTRATE 25 MG PO TABS
25.0000 mg | ORAL_TABLET | Freq: Two times a day (BID) | ORAL | Status: DC
  Administered 2023-08-04 – 2023-08-10 (×13): 25 mg via ORAL
  Filled 2023-08-04 (×13): qty 1

## 2023-08-04 MED ORDER — INSULIN ASPART 100 UNIT/ML IJ SOLN
0.0000 [IU] | Freq: Three times a day (TID) | INTRAMUSCULAR | Status: DC
  Administered 2023-08-04: 2 [IU] via SUBCUTANEOUS
  Administered 2023-08-05: 1 [IU] via SUBCUTANEOUS
  Administered 2023-08-06: 2 [IU] via SUBCUTANEOUS

## 2023-08-04 MED ORDER — METHIMAZOLE 5 MG PO TABS
5.0000 mg | ORAL_TABLET | Freq: Every day | ORAL | Status: DC
  Administered 2023-08-04 – 2023-08-10 (×7): 5 mg via ORAL
  Filled 2023-08-04 (×7): qty 1

## 2023-08-04 NOTE — Progress Notes (Signed)
OT Cancellation Note  Patient Details Name: Priscilla Wilson MRN: [id number] DOB: 06-Oct-1951   Cancelled Treatment:    Reason Eval/Treat Not Completed: Active bedrest order- will follow to see when appropriate and able.   Barry Brunner, OT Acute Rehabilitation Services Office [phone]   Chancy Milroy 08/04/2023, 9:19 AM

## 2023-08-04 NOTE — Plan of Care (Signed)
  Problem: Ischemic Stroke/TIA Tissue Perfusion: Goal: Complications of ischemic stroke/TIA will be minimized 08/04/2023 1712 by Franky Macho, RN Outcome: Progressing 08/04/2023 0725 by Franky Macho, RN Outcome: Progressing   Problem: Coping: Goal: Will verbalize positive feelings about self 08/04/2023 1712 by Franky Macho, RN Outcome: Progressing 08/04/2023 0725 by Franky Macho, RN Outcome: Progressing   Problem: Self-Care: Goal: Ability to communicate needs accurately will improve 08/04/2023 1712 by Franky Macho, RN Outcome: Progressing 08/04/2023 0725 by Franky Macho, RN Outcome: Progressing   Problem: Nutrition: Goal: Risk of aspiration will decrease 08/04/2023 1712 by Franky Macho, RN Outcome: Progressing 08/04/2023 0725 by Franky Macho, RN Outcome: Progressing   Problem: Nutrition: Goal: Risk of aspiration will decrease 08/04/2023 1712 by Franky Macho, RN Outcome: Progressing 08/04/2023 0725 by Franky Macho, RN Outcome: Progressing   Problem: Nutrition: Goal: Dietary intake will improve 08/04/2023 1712 by Franky Macho, RN Outcome: Progressing 08/04/2023 0725 by Franky Macho, RN Outcome: Progressing   Problem: Tissue Perfusion: Goal: Adequacy of tissue perfusion will improve Outcome: Progressing

## 2023-08-04 NOTE — Progress Notes (Addendum)
STROKE TEAM PROGRESS NOTE   BRIEF HPI [name] is a 72 y.o. female with history of GERD, HLD, asthma presenting initially to [facility] ED with acute onset of left-sided numbness and incoordination at home.  Symptoms fluctuated while at outside hospital with brief improvement followed by return of symptoms prompting code stroke activation and patient was subsequently given TNKase by teleneurology.  Patient was then transported to [facility] hospital for further neurology evaluation.   SIGNIFICANT HOSPITAL EVENTS 9/23: TNK administered 9/23 via teleneurology.  Patient transported to [facility] via [facility] for further neurology evaluation and stroke workup. 9/24: Patient reports disturbed sleep overnight.  Appears anxious on examination this morning.  Patient reports missing her thyroid medication this morning.  Requires Cleviprex gtt for blood pressure control.  INTERIM HISTORY/SUBJECTIVE Patient is sitting up in ICU bed, appears anxious.  Patient's husband is at bedside.  Patient's heart rate is elevated to the 140s.  Patient reports not getting her methimazole medication this morning and having disrupted sleep overnight.  She does remain hypertensive requiring Cleviprex drip for blood pressure control.  Patient reports improvement in her left lower extremity numbness and incoordination.  Neurologic exam is stable with ongoing loss of proprioception and sensory deficits on the left upper extremity. Blood pressure adequately controlled. OBJECTIVE  CBC    Component Value Date/Time   WBC 11.4 (H) 08/03/2023 1809   RBC 4.88 08/03/2023 1809   HGB 13.6 08/03/2023 1809   HCT 41.3 08/03/2023 1809   PLT 315 08/03/2023 1809   MCV 84.6 08/03/2023 1809   MCH 27.9 08/03/2023 1809   MCHC 32.9 08/03/2023 1809   RDW 13.5 08/03/2023 1809   LYMPHSABS 1.2 08/03/2023 1809   MONOABS 0.4 08/03/2023 1809   EOSABS 0.0 08/03/2023 1809   BASOSABS 0.0 08/03/2023 1809   BMET    Component Value  Date/Time   NA 138 08/03/2023 1809   K 3.8 08/03/2023 1809   CL 105 08/03/2023 1809   CO2 21 (L) 08/03/2023 1809   GLUCOSE 252 (H) 08/03/2023 1809   BUN 22 08/03/2023 1809   CREATININE 0.80 08/03/2023 1809   CALCIUM 9.8 08/03/2023 1809   GFRNONAA >60 08/03/2023 1809   IMAGING past 24 hours CT ANGIO HEAD NECK W WO CM (CODE STROKE)  Result Date: 08/03/2023 CLINICAL DATA:  Stroke versus transient ischemic attack. Left-sided extremity weakness, facial tingling and numbness. EXAM: CT ANGIOGRAPHY HEAD AND NECK WITH AND WITHOUT CONTRAST TECHNIQUE: Multidetector CT imaging of the head and neck was performed using the standard protocol during bolus administration of intravenous contrast. Multiplanar CT image reconstructions and MIPs were obtained to evaluate the vascular anatomy. Carotid stenosis measurements (when applicable) are obtained utilizing NASCET criteria, using the distal internal carotid diameter as the denominator. RADIATION DOSE REDUCTION: This exam was performed according to the departmental dose-optimization program which includes automated exposure control, adjustment of the mA and/or kV according to patient size and/or use of iterative reconstruction technique. CONTRAST:  75mL OMNIPAQUE IOHEXOL 350 MG/ML SOLN COMPARISON:  Head CT 08/03/2023. FINDINGS: CTA NECK FINDINGS Aortic arch: Two-vessel arch configuration with common origin of the right brachiocephalic and left common carotid arteries. Arch vessel origins are patent. Right carotid system: No evidence of dissection, stenosis (50% or greater), or occlusion. Left carotid system: No evidence of dissection, stenosis (50% or greater), or occlusion. Vertebral arteries: Codominant. No evidence of dissection, stenosis (50% or greater), or occlusion. Skeleton: Mild cervical spondylosis without high-grade spinal canal stenosis. Other neck: 1.8 cm right thyroid nodule (  axial image 27 series 4). Upper chest: Unremarkable. Review of the MIP images  confirms the above findings CTA HEAD FINDINGS Anterior circulation: Intracranial ICAs are patent without stenosis or aneurysm. The proximal ACAs and MCAs are patent without stenosis or aneurysm. Distal branches are symmetric. Posterior circulation: Normal basilar artery. The SCAs, AICAs and PICAs are patent proximally. The PCAs are patent proximally without stenosis or aneurysm. Distal branches are symmetric. Venous sinuses: As permitted by contrast timing, patent. Anatomic variants: Hypoplastic right A1 segment. Review of the MIP images confirms the above findings IMPRESSION: 1. No large vessel occlusion, hemodynamically significant stenosis, or aneurysm in the head or neck. 2. Right thyroid nodule, 1.8 cm. Recommend nonurgent thyroid US (ref: J Am Coll Radiol. 2015 Feb;12(2): 143-50). Electronically Signed   By: Orvan Falconer M.D.   On: 08/03/2023 18:43   CT HEAD CODE STROKE WO CONTRAST`  Result Date: 08/03/2023 CLINICAL DATA:  Code stroke. Stroke versus transient ischemic attack. Left-sided weakness. EXAM: CT HEAD WITHOUT CONTRAST TECHNIQUE: Contiguous axial images were obtained from the base of the skull through the vertex without intravenous contrast. RADIATION DOSE REDUCTION: This exam was performed according to the departmental dose-optimization program which includes automated exposure control, adjustment of the mA and/or kV according to patient size and/or use of iterative reconstruction technique. COMPARISON:  None Available. FINDINGS: Brain: No acute hemorrhage. Cortical gray-white differentiation is preserved. Patchy hypoattenuation of the cerebral white matter, most consistent with mild chronic small-vessel disease. Prominence of the ventricles and sulci within normal limits for age. No extra-axial collection. Basilar cisterns are patent. Vascular: No hyperdense vessel or unexpected calcification. Skull: No calvarial fracture or suspicious bone lesion. Skull base is unremarkable. Sinuses/Orbits:  No acute finding. Other: None. ASPECTS [facility] Stroke Program Early CT Score) - Ganglionic level infarction (caudate, lentiform nuclei, internal capsule, insula, M1-M3 cortex): 7 - Supraganglionic infarction (M4-M6 cortex): 3 Total score (0-10 with 10 being normal): 10 IMPRESSION: 1. No acute intracranial hemorrhage or evidence of acute large vessel territory infarct. ASPECT score is 10. 2. Mild chronic small-vessel disease. Code stroke imaging results were communicated on 08/03/2023 at 6:23 pm to provider Dr. Elayne Snare via telephone, who verbally acknowledged these results. Electronically Signed   By: Orvan Falconer M.D.   On: 08/03/2023 18:23    Vitals:   08/04/23 0815 08/04/23 0830 08/04/23 0845 08/04/23 0900  BP: (!) 112/91 (!) 148/102 (!) 197/97 (!) 199/94  Pulse: 81 80 80 81  Resp: 18 15 19  (!) 22  Temp:      TempSrc:      SpO2: 95% 92% 96% 95%  Weight:      Height:       PHYSICAL EXAM General:  Alert, well-nourished, well-developed Caucasian female patient in no acute distress Psych:  Mood and affect appropriate for situation, patient appears anxious throughout exam CV: Tachycardia with a rate up to 140s on cardiac monitor Respiratory:  Regular, unlabored respirations on room air GI: Abdomen soft and nontender  NEURO:  Mental Status: AA&Ox3, patient is able to give clear and coherent history of present illness Speech/Language: speech is without dysarthria or aphasia.  Naming, repetition, fluency, and comprehension intact.  Cranial Nerves:  II: PERRL. Visual fields full.  III, IV, VI: EOMI. Eyelids elevate symmetrically.  V: Sensation is intact to light touch and symmetrical to face.  VII: Face is symmetrical resting and smiling VIII: hearing intact to voice. IX, X: Palate elevates symmetrically. Phonation is normal.  HK:VQQVZDGL shrug 5/5. XII: tongue is midline without  fasciculations. Motor: There is pseudo extremity drift of the left upper extremity, improved with eyes open.   There is loss of proprioception to the left hand with astereognosis.  5/5 strength throughout bilateral upper and lower extremities on confrontational strength testing Tone: is normal and bulk is normal Sensation: Intact to light touch bilateral lower extremities.  Decree sensation to light touch to the left upper extremity and face. Decreased vibration and proprioception on the left upper extremity. Coordination: FTN intact bilaterally, HKS: no ataxia in BLE.No drift.  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  Right thalamic versus right parietal my clinical assessment Etiology: Pending further workup CT head no acute intracranial hemorrhage or evidence of acute large vessel territory infarct.  Aspect score is 10.  Mild chronic small vessel disease. CTA head & neck no LVO, hemodynamically significant stenosis, or aneurysm in the head or neck. MRI brain pending 2D Echo LVEF 70 to 75%.  The left ventricle has hyperdynamic function.  Right ventricular systolic function is hyperdynamic. LDL 88 HgbA1c 6.3% VTE prophylaxis -SCDs No antithrombotic prior to admission, now on No antithrombotic for 24 hours post-TNK. Therapy recommendations: Pending Disposition: Pending  Hypertension Home meds: None Unstable requiring Cleviprex drip Blood Pressure Goal: BP less than 180/105  Metoprolol 25 mg twice daily initiated due to tachycardia and hypertension As needed labetalol and hydralazine ordered  Hyperlipidemia Home meds: Simvastatin 10 mg LDL 88, goal < 70 Increased simvastatin to 40 mg p.o. daily Continue statin at discharge  Other Stroke Risk Factors   Other Active Problems Hyperthyroidism Continued home Methimazole Thyroid profile serum labs ordered  Hospital day # 1  Lanae Boast, AGACNP-BC [facility] Pager: [phone] STROKE MD NOTE : I have personally obtained  history,examined this patient, reviewed notes, independently viewed imaging studies, participated in medical decision making and plan of care.ROS completed by me personally and pertinent positives fully documented  I have made any additions or clarifications directly to the above note. Agree with note above.  Patient presented with left-sided weakness and numbness secondary to right parietal lobe thalamic infarct treated with IV TNK with significant improvement.  Recommend close neurological observation and strict blood pressure control as per post TNK protocol.  Check MRI scan of the brain.  Continue ongoing stroke workup.  Mobilize out of bed.  Therapy consults.  Check 24-hour post TNK brain imaging and then start antiplatelet therapy.  Long discussion with the patient and answered questions..This patient is critically ill and at significant risk of neurological worsening, death and care requires constant monitoring of vital signs, hemodynamics,respiratory and cardiac monitoring, extensive review of multiple databases, frequent neurological assessment, discussion with family, other specialists and medical decision making of high complexity.I have made any additions or clarifications directly to the above note.This critical care time does not reflect procedure time, or teaching time or supervisory time of PA/NP/Med Resident etc but could involve care discussion time.  I spent 30 minutes of neurocritical care time  in the care of  this patient.       Delia Heady, MD Medical Director [facility] Stroke Center Pager: [phone] 08/04/2023 8:19 PM  To contact Stroke Continuity provider, please refer to [email or web address]. After hours, contact General Neurology

## 2023-08-04 NOTE — Progress Notes (Signed)
PT Cancellation Note  Patient Details Name: Priscilla Wilson MRN: [id number] DOB: Feb 22, 1951   Cancelled Treatment:    Reason Eval/Treat Not Completed: Active bedrest order (until 2100 08/04/23).   Lyanne Co, PT  Acute Rehab Services Secure chat preferred Office [phone]    Elyse Hsu 08/04/2023, 8:31 AM

## 2023-08-04 NOTE — Evaluation (Signed)
Speech Language Pathology Evaluation Patient Details Name: Priscilla Wilson MRN: [id number] DOB: April 16, 1951 Today's Date: 08/04/2023 Time: 0272-5366 SLP Time Calculation (min) (ACUTE ONLY): 13 min  Problem List:  Patient Active Problem List   Diagnosis Date Noted   TIA (transient ischemic attack) 08/03/2023   Stroke determined by clinical assessment (HCC) 08/03/2023   Past Medical History: History reviewed. No pertinent past medical history. Past Surgical History:  Past Surgical History:  Procedure Laterality Date   BREAST EXCISIONAL BIOPSY Left    benign   HPI:  Priscilla Wilson is a 72 yo female presenting to ED 9/23 with sudden onset L sided numbness and incoordination. CTH and CTA unremarkable. MRI Brain pending. PMH includes GERD, HLD, asthma   Assessment / Plan / Recommendation Clinical Impression  Pt reports that she lives at home with her husband, where they both independently manage all finances and medications. She scored a 27/30 on the SLUMS, which is considered WFL. No further needs identified. SLP to s/o at this time.    SLP Assessment  SLP Recommendation/Assessment: Patient does not need any further Speech Lanaguage Pathology Services SLP Visit Diagnosis: Cognitive communication deficit (R41.841)    Recommendations for follow up therapy are one component of a multi-disciplinary discharge planning process, led by the attending physician.  Recommendations may be updated based on patient status, additional functional criteria and insurance authorization.    Follow Up Recommendations  No SLP follow up    Assistance Recommended at Discharge  PRN  Functional Status Assessment Patient has not had a recent decline in their functional status  Frequency and Duration           SLP Evaluation Cognition  Overall Cognitive Status: Within Functional Limits for tasks assessed Orientation Level: Oriented X4       Comprehension  Auditory Comprehension Overall Auditory  Comprehension: Appears within functional limits for tasks assessed    Expression Expression Primary Mode of Expression: Verbal Verbal Expression Overall Verbal Expression: Appears within functional limits for tasks assessed Written Expression Dominant Hand: Right   Oral / Motor  Oral Motor/Sensory Function Overall Oral Motor/Sensory Function: Within functional limits Motor Speech Overall Motor Speech: Appears within functional limits for tasks assessed            Gwynneth Aliment, M.A., CF-SLP Speech Language Pathology, Acute Rehabilitation Services  Secure Chat preferred [phone]  08/04/2023, 2:57 PM

## 2023-08-04 NOTE — Progress Notes (Signed)
  Echocardiogram 2D Echocardiogram has been performed.  Priscilla Wilson 08/04/2023, 9:41 AM

## 2023-08-04 NOTE — Progress Notes (Signed)
eLink Physician-Brief Progress Note Patient Name: Ryliegh Sevcik DOB: 10-05-1951 MRN: [id number]   Date of Service  08/04/2023  HPI/Events of Note  72 year old female with a history of gastroesophageal reflux disease, dyslipidemia, asthma who initially presented with left-sided numbness and discoordination in the setting of hypertensive emergency and NIH stroke scale of 4 for limb ataxia Who received tenecteplase.  Presented with hypertension into the 180s now improved with heart rate in the 110s and otherwise normal vitals.  SpO2 94% on room air.  Initial metabolic panel, lipid panel, CBC and urinalysis all fairly unremarkable.  EKG with sinus arrhythmia.  No LVO on CT angiography.  CT head unremarkable.  eICU Interventions  Tenecteplase precautions for 24 hours until 9/24 @2100   Clevidipine as needed to maintain SBP less than 185  MRI brain pending  GI prophylaxis with pantoprazole, discontinue concurrent famotidine  DVT prophylaxis held in the setting of tenecteplase     Intervention Category Evaluation Type: New Patient Evaluation  Elica Almas 08/04/2023, 6:47 AM

## 2023-08-05 ENCOUNTER — Other Ambulatory Visit: Payer: Self-pay | Admitting: Cardiology

## 2023-08-05 DIAGNOSIS — I639 Cerebral infarction, unspecified: Secondary | ICD-10-CM

## 2023-08-05 DIAGNOSIS — G459 Transient cerebral ischemic attack, unspecified: Secondary | ICD-10-CM | POA: Diagnosis not present

## 2023-08-05 LAB — T3: T3, Total: 79 ng/dL (ref 71–180)

## 2023-08-05 LAB — GLUCOSE, CAPILLARY
Glucose-Capillary: 102 mg/dL — ABNORMAL HIGH (ref 70–99)
Glucose-Capillary: 105 mg/dL — ABNORMAL HIGH (ref 70–99)
Glucose-Capillary: 142 mg/dL — ABNORMAL HIGH (ref 70–99)
Glucose-Capillary: 108 mg/dL — ABNORMAL HIGH (ref 70–99)

## 2023-08-05 MED ORDER — ASPIRIN 81 MG PO CHEW: 81.0000 mg | CHEWABLE_TABLET | Freq: Every day | ORAL | Status: DC

## 2023-08-05 MED ORDER — FAMOTIDINE 20 MG PO TABS
40.0000 mg | ORAL_TABLET | Freq: Every day | ORAL | Status: DC
  Administered 2023-08-05 – 2023-08-09 (×5): 40 mg via ORAL
  Filled 2023-08-05 (×5): qty 2

## 2023-08-05 MED ORDER — CLOPIDOGREL BISULFATE 75 MG PO TABS
75.0000 mg | ORAL_TABLET | Freq: Every day | ORAL | Status: DC
  Administered 2023-08-05 – 2023-08-10 (×6): 75 mg via ORAL
  Filled 2023-08-05 (×6): qty 1

## 2023-08-05 MED ORDER — ASPIRIN 81 MG PO TBEC
81.0000 mg | DELAYED_RELEASE_TABLET | Freq: Every day | ORAL | Status: DC
  Administered 2023-08-05 – 2023-08-10 (×6): 81 mg via ORAL
  Filled 2023-08-05 (×6): qty 1

## 2023-08-05 NOTE — TOC CM/SW Note (Signed)
Transition of Care [facility]) - Inpatient Brief Assessment   Patient Details  Name: Priscilla Wilson MRN: [id number] Date of Birth: [birth year]  [facility] CM/SW Contact:    Mearl Latin, LCSW Phone Number: 08/05/2023, 3:20 PM   Clinical Narrative: Patient admitted from home with spouse undergoing TIA/stroke workup. TOC following for therapy needs.    Transition of Care Asessment: Insurance and Status: Insurance coverage has been reviewed Patient has primary care physician: Yes Home environment has been reviewed: From home Prior level of function:: Indepdent Prior/Current Home Services: No current home services Social Determinants of Health Reivew: SDOH reviewed no interventions necessary Readmission risk has been reviewed: Yes Transition of care needs: transition of care needs identified, TOC will continue to follow

## 2023-08-05 NOTE — Evaluation (Signed)
Physical Therapy Evaluation Patient Details Name: Priscilla Wilson MRN: [id number] DOB: [birth year] Today's Date: 08/05/2023  History of Present Illness  Pt is 72 yo female who presents on 08/03/23 with ataxia and L sided numbness who received TNK. 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 PMH: GERD, asthma, dyslipidemia   Clinical Impression  Pt presents with condition above and deficits mentioned below, see PT Problem List. PTA, she was independent without AD, living with her husband in a 2-level house with 4 STE. She does not need to go upstairs. Currently, pt is demonstrating deficits in L-sided strength, coordination, proprioception, and sensation. She has difficulty keeping her L hand on the RW and has a tendency for her L knee to buckle. She is at high risk for falls. At this time, pt is requiring up to [facility] for transfers and to ambulate bedroom distances with a RW. As pt has had a drastic functional decline and is highly motivated to participate and improve, she could greatly benefit from intensive inpatient rehab, > 3 hours/day. Will continue to follow acutely.        If plan is discharge home, recommend the following: A lot of help with walking and/or transfers;A lot of help with bathing/dressing/bathroom;Assistance with cooking/housework;Assist for transportation;Help with stairs or ramp for entrance   Can travel by private vehicle        Equipment Recommendations BSC/3in1  Recommendations for Other Services  Rehab consult    Functional Status Assessment Patient has had a recent decline in their functional status and demonstrates the ability to make significant improvements in function in a reasonable and predictable amount of time.     Precautions / Restrictions Precautions Precautions: Fall Restrictions Weight Bearing Restrictions: No      Mobility  Bed Mobility               General bed mobility comments: Pt sitting EOB upon arrival     Transfers Overall transfer level: Needs assistance Equipment used: Rolling walker (2 wheels), None Transfers: Sit to/from Stand Sit to Stand: Min assist, Mod assist           General transfer comment: Pt stood the first rep without an AD but displayed a L lateral LOB, needing modA to recover. Pt stood another rep from EOB with the RW for support. Cues provided to pt on hand placement, minA to steady pt coming to stand from EOB to RW    Ambulation/Gait Ambulation/Gait assistance: Mod assist Gait Distance (Feet): 20 Feet Assistive device: Rolling walker (2 wheels) Gait Pattern/deviations: Step-through pattern, Decreased step length - right, Decreased step length - left, Decreased stride length, Knees buckling, Trunk flexed, Ataxic Gait velocity: reduced Gait velocity interpretation: <1.31 ft/sec, indicative of household ambulator   General Gait Details: Pt takes slow, unsteady, ataxic steps with noted L knee buckling and her L hand slipping off the RW grip often. Pt needed repeated cues to visually attend to her L hand to keep it placed to assist in managing her balance when ambulating. ModA for balance.  Stairs            Wheelchair Mobility     Tilt Bed    Modified Rankin (Stroke Patients Only) Modified Rankin (Stroke Patients Only) Pre-Morbid Rankin Score: No symptoms Modified Rankin: Moderately severe disability     Balance Overall balance assessment: Needs assistance Sitting-balance support: No upper extremity supported, Feet supported Sitting balance-Leahy Scale: Fair Sitting balance - Comments: Can sit statically with CGA for safety but when shifting weight  to L pt was unsteady and unsafe, needed close guarding for safety   Standing balance support: Bilateral upper extremity supported, During functional activity, Reliant on assistive device for balance Standing balance-Leahy Scale: Poor Standing balance comment: Reliant on RW and modA                              Pertinent Vitals/Pain Pain Assessment Pain Assessment: Faces Faces Pain Scale: No hurt Pain Intervention(s): Monitored during session    Home Living Family/patient expects to be discharged to:: Private residence Living Arrangements: Spouse/significant other Available Help at Discharge: Family;Available 24 hours/day Type of Home: House Home Access: Stairs to enter Entrance Stairs-Rails: Right;Left Entrance Stairs-Number of Steps: 4 Alternate Level Stairs-Number of Steps: 14 Home Layout: Two level;Full bath on main level;Able to live on main level with bedroom/bathroom Home Equipment: Rolling Walker (2 wheels);Shower seat;Grab bars - tub/shower      Prior Function Prior Level of Function : Driving;Independent/Modified Independent             Mobility Comments: No AD       Extremity/Trunk Assessment   Upper Extremity Assessment Upper Extremity Assessment: Defer to OT evaluation LUE Deficits / Details: Decreased sensation throughout entire L side, ataxic movement with LUE LUE Sensation: decreased light touch LUE Coordination: decreased fine motor;decreased gross motor    Lower Extremity Assessment Lower Extremity Assessment: LLE deficits/detail LLE Deficits / Details: MMT scores of 4 grossly (weaker than R side); decreased sensation throughout L leg; ataxic movement noted LLE Sensation: decreased light touch;decreased proprioception LLE Coordination: decreased gross motor;decreased fine motor    Cervical / Trunk Assessment Cervical / Trunk Assessment: Normal  Communication   Communication Communication: No apparent difficulties  Cognition Arousal: Alert Behavior During Therapy: WFL for tasks assessed/performed Overall Cognitive Status: Within Functional Limits for tasks assessed                                          General Comments General comments (skin integrity, edema, etc.): VSS on RA; husband present and supportive;  enocuraged use and movement of L side    Exercises     Assessment/Plan    PT Assessment Patient needs continued PT services  PT Problem List Decreased strength;Decreased activity tolerance;Decreased balance;Decreased mobility;Decreased coordination;Impaired sensation       PT Treatment Interventions Gait training;DME instruction;Functional mobility training;Stair training;Therapeutic activities;Therapeutic exercise;Balance training;Neuromuscular re-education;Patient/family education    PT Goals (Current goals can be found in the Care Plan section)  Acute Rehab PT Goals Patient Stated Goal: to improve PT Goal Formulation: With patient/family Time For Goal Achievement: 08/19/23 Potential to Achieve Goals: Good    Frequency Min 1X/week     Co-evaluation               AM-PAC PT "6 Clicks" Mobility  Outcome Measure Help needed turning from your back to your side while in a flat bed without using bedrails?: A Little Help needed moving from lying on your back to sitting on the side of a flat bed without using bedrails?: A Little Help needed moving to and from a bed to a chair (including a wheelchair)?: A Lot Help needed standing up from a chair using your arms (e.g., wheelchair or bedside chair)?: A Lot Help needed to walk in hospital room?: A Lot Help needed climbing 3-5 steps with a  railing? : Total 6 Click Score: 13    End of Session Equipment Utilized During Treatment: Gait belt Activity Tolerance: Patient tolerated treatment well Patient left: in chair;with call bell/phone within reach;with chair alarm set;with family/visitor present Nurse Communication: Mobility status PT Visit Diagnosis: Unsteadiness on feet (R26.81);Other abnormalities of gait and mobility (R26.89);Muscle weakness (generalized) (M62.81);Difficulty in walking, not elsewhere classified (R26.2);Other symptoms and signs involving the nervous system (R29.898);Hemiplegia and hemiparesis Hemiplegia -  Right/Left: Left Hemiplegia - caused by: Cerebral infarction    Time: 6644-0347 PT Time Calculation (min) (ACUTE ONLY): 21 min   Charges:   PT Evaluation $PT Eval Moderate Complexity: 1 Mod   PT General Charges $$ ACUTE PT VISIT: 1 Visit         Virgil Benedict, PT, DPT Acute Rehabilitation Services  Office: [phone]   Priscilla Wilson 08/05/2023, 4:10 PM

## 2023-08-05 NOTE — Evaluation (Signed)
Occupational Therapy Evaluation Patient Details Name: Priscilla Wilson MRN: [id number] DOB: 03/24/1951 Today's Date: 08/05/2023   History of Present Illness Pt is 72 yo female who presents on 08/03/23 with ataxia and L sided numbness who received TNK. 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 PMH: GERD, asthma, dyslipidemia   Clinical Impression   Prior to this admission, patient living with her spouse, and reports independence with all ADLs and IADLs without DME, and reports she was still driving. Patient presenting with minimal decreased sensation on L side, LUE ataxia, and need for minimal increased assist in order to complete ADLs (up to mod A for lower body dressing). Patient min A for ADL management. Patient would benefit from further visual field testing, head tilt to the RIGHT, decreased attention and then started to laugh and could not focus further. Please refer to PT note for functional mobility assessment. Given prior level of function and current level, OT recommending intensive therapy > 3 hours in order to address functional deficits and safely return home. OT will continue to follow.       If plan is discharge home, recommend the following: A lot of help with walking and/or transfers;A lot of help with bathing/dressing/bathroom;Assistance with cooking/housework;Assist for transportation;Help with stairs or ramp for entrance    Functional Status Assessment  Patient has had a recent decline in their functional status and demonstrates the ability to make significant improvements in function in a reasonable and predictable amount of time.  Equipment Recommendations  Other (comment) (defer to next venue)    Recommendations for Other Services Rehab consult     Precautions / Restrictions Precautions Precautions: None Restrictions Weight Bearing Restrictions: No      Mobility Bed Mobility Overal bed mobility: Needs Assistance Bed Mobility: Supine to Sit      Supine to sit: Contact guard     General bed mobility comments: for safety    Transfers Overall transfer level: Needs assistance                 General transfer comment: sitting EOB at end of OT session, please refer to PT noted      Balance Overall balance assessment: Mild deficits observed, not formally tested                                         ADL either performed or assessed with clinical judgement   ADL Overall ADL's : Needs assistance/impaired Eating/Feeding: Set up;Sitting   Grooming: Set up;Sitting   Upper Body Bathing: Contact guard assist;Sitting   Lower Body Bathing: Sitting/lateral leans;Sit to/from stand;Moderate assistance   Upper Body Dressing : Contact guard assist;Sitting   Lower Body Dressing: Minimal assistance;Sit to/from stand;Sitting/lateral leans   Toilet Transfer: Ambulation;Rolling walker (2 wheels);Moderate assistance Toilet Transfer Details (indicate cue type and reason): simulated Toileting- Clothing Manipulation and Hygiene: Sit to/from stand;Sitting/lateral lean;Moderate assistance       Functional mobility during ADLs: Minimal assistance;Cueing for safety;Cueing for sequencing;Rolling walker (2 wheels) General ADL Comments: Patient presenting with minimal decreased sensation on L side, LUE ataxia, and need for minimal increased assist in order to complete ADLs. Patient min A for ADL management. Please refer to PT note for functional mobility assessment.     Vision Baseline Vision/History: 1 Wears glasses Ability to See in Adequate Light: 0 Adequate Patient Visual Report: No change from baseline Vision Assessment?: Yes Eye Alignment: Within Functional  Limits Ocular Range of Motion: Within Functional Limits Alignment/Gaze Preference: Head tilt (Head tilt to RIGHT) Tracking/Visual Pursuits: Able to track stimulus in all quads without difficulty Saccades: Within functional limits Convergence: Within  functional limits Additional Comments: Would benefit from further visual field testing, head tilt to the RIGHT, decreased attention and then started to laugh and could not focus further     Perception Perception: Impaired Preception Impairment Details: Inattention/Neglect, Body Scheme (LEFT SIDE)     Praxis Praxis: Impaired Praxis Impairment Details: Limb apraxia (LEFT)     Pertinent Vitals/Pain Pain Assessment Pain Assessment: No/denies pain     Extremity/Trunk Assessment Upper Extremity Assessment Upper Extremity Assessment: LUE deficits/detail LUE Deficits / Details: Decreased sensation throughout entire L side, ataxic movement with LUE LUE Sensation: decreased light touch LUE Coordination: decreased fine motor;decreased gross motor   Lower Extremity Assessment Lower Extremity Assessment: Defer to PT evaluation   Cervical / Trunk Assessment Cervical / Trunk Assessment: Normal   Communication Communication Communication: No apparent difficulties   Cognition Arousal: Alert Behavior During Therapy: WFL for tasks assessed/performed Overall Cognitive Status: Within Functional Limits for tasks assessed                                       General Comments  VSS    Exercises     Shoulder Instructions      Home Living Family/patient expects to be discharged to:: Private residence Living Arrangements: Spouse/significant other Available Help at Discharge: Family;Available 24 hours/day Type of Home: House Home Access: Stairs to enter [employer] of Steps: 4 Entrance Stairs-Rails: Right;Left Home Layout: Two level;Full bath on main level;Able to live on main level with bedroom/bathroom Alternate Level Stairs-Number of Steps: 14   Bathroom Shower/Tub: Walk-in shower;Other (comment) (Walk in tub as well)   Bathroom Toilet: Handicapped height Bathroom Accessibility: Yes How Accessible: Accessible via walker Home Equipment: Rolling Walker (2  wheels);Shower seat;Grab bars - tub/shower      Lives With: Spouse    Prior Functioning/Environment Prior Level of Function : Driving;Independent/Modified Independent                        OT Problem List: Decreased strength;Decreased coordination;Decreased safety awareness;Impaired balance (sitting and/or standing);Decreased knowledge of use of DME or AE;Decreased knowledge of precautions;Impaired sensation;Impaired UE functional use;Decreased activity tolerance      OT Treatment/Interventions: Self-care/ADL training;Therapeutic exercise;Neuromuscular education;Energy conservation;DME and/or AE instruction;Manual therapy;Therapeutic activities;Visual/perceptual remediation/compensation;Patient/family education;Balance training    OT Goals(Current goals can be found in the care plan section) Acute Rehab OT Goals Patient Stated Goal: to get better OT Goal Formulation: With patient Time For Goal Achievement: 08/19/23 Potential to Achieve Goals: Good  OT Frequency: Min 1X/week    Co-evaluation              AM-PAC OT "6 Clicks" Daily Activity     Outcome Measure Help from another person eating meals?: A Little Help from another person taking care of personal grooming?: A Little Help from another person toileting, which includes using toliet, bedpan, or urinal?: A Lot Help from another person bathing (including washing, rinsing, drying)?: A Little Help from another person to put on and taking off regular upper body clothing?: A Little Help from another person to put on and taking off regular lower body clothing?: A Lot 6 Click Score: 16   End of Session Nurse Communication: Mobility status  Activity Tolerance: Patient tolerated treatment well Patient left: Other (comment) (sitting EOB for PT eval)  OT Visit Diagnosis: Unsteadiness on feet (R26.81);Ataxia, unspecified (R27.0)                Time: 3220-2542 OT Time Calculation (min): 23 min Charges:  OT General  Charges $OT Visit: 1 Visit OT Evaluation $OT Eval Moderate Complexity: 1 Mod OT Treatments $Self Care/Home Management : 8-22 mins  Pollyann Glen E. Davie Claud, OTR/L Acute Rehabilitation Services [phone]   Cherlyn Cushing 08/05/2023, 10:09 AM

## 2023-08-05 NOTE — Progress Notes (Addendum)
STROKE TEAM PROGRESS NOTE   BRIEF HPI [name] is a 72 y.o. female with history of GERD, HLD, asthma presenting initially to [facility] with acute onset of left-sided numbness and incoordination at home.  Symptoms fluctuated while at outside hospital with brief improvement followed by return of symptoms prompting code stroke activation and patient was subsequently given TNKase by teleneurology.  Patient was then transported to [facility] hospital for further neurology evaluation.  SIGNIFICANT HOSPITAL EVENTS 9/23: TNK administered 9/23 via teleneurology.  Patient transported to [facility] for further neurology evaluation and stroke workup. 9/24: Patient reports disturbed sleep overnight.  Appears anxious on examination this morning.  Patient reports missing her thyroid medication this morning.  Requires Cleviprex gtt for blood pressure control. 9/24: Methimazole initiated with improvement in heart rate, anxiety.  Metoprolol ordered with improvement in blood pressure, Cleviprex off.  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 9/25: Patient stabilized for transfer out of the ICU.  INTERIM HISTORY/SUBJECTIVE Patient is sitting up at the bedside in the ICU with OT at bedside.  Patient's husband also arrived to patient's room during neurology examination.  Patient's heart rate significantly improved to a rate in the 80s and patient appears less anxious today on examination than yesterday.  Blood pressure stabilized, Cleviprex off since 08/04/2023.  Neurologic exam stable, patient reports some improvement in left hemisensory disturbance with ongoing complications with proprioception to the left upper extremity.  Patient is medically stabilized for transfer out of the ICU.  Pending therapy recommendations. MRI shows right thalamic  infarct with petechial hemorrhagic transformation. OBJECTIVE CBC    Component Value Date/Time   WBC 11.4 (H) 08/03/2023 1809   RBC 4.88 08/03/2023 1809   HGB 13.6 08/03/2023 1809   HCT 41.3 08/03/2023 1809   PLT 315 08/03/2023 1809   MCV 84.6 08/03/2023 1809   MCH 27.9 08/03/2023 1809   MCHC 32.9 08/03/2023 1809   RDW 13.5 08/03/2023 1809   LYMPHSABS 1.2 08/03/2023 1809   MONOABS 0.4 08/03/2023 1809   EOSABS 0.0 08/03/2023 1809   BASOSABS 0.0 08/03/2023 1809   BMET    Component Value Date/Time   NA 138 08/03/2023 1809   K 3.8 08/03/2023 1809   CL 105 08/03/2023 1809   CO2 21 (L) 08/03/2023 1809   GLUCOSE 252 (H) 08/03/2023 1809   BUN 22 08/03/2023 1809   CREATININE 0.80 08/03/2023 1809   CALCIUM 9.8 08/03/2023 1809   GFRNONAA >60 08/03/2023 1809   IMAGING past 24 hours MR BRAIN WO CONTRAST  Result Date: 08/04/2023 CLINICAL DATA:  Initial evaluation for neuro deficit, stroke. EXAM: MRI HEAD WITHOUT CONTRAST TECHNIQUE: Multiplanar, multiecho pulse sequences of the brain and surrounding structures were obtained without intravenous contrast. COMPARISON:  CTs from 08/03/2023. FINDINGS: Brain: Cerebral volume within normal limits. Patchy T2/FLAIR hyperintensity involving the periventricular deep white matter both cerebral hemispheres as well as the pons, consistent with chronic small vessel ischemic disease, moderately advanced in nature. 1.6 cm focus of restricted diffusion involving the right thalamocapsular region, consistent with an acute ischemic infarct. Associated mild petechial blood products without frank hemorrhagic transformation or significant regional mass effect. No other evidence for acute or subacute ischemia. No other acute or chronic intracranial blood products. No mass lesion, midline shift or mass effect. No hydrocephalus or extra-axial fluid collection. Pituitary gland and suprasellar region within normal limits. Vascular: Major intracranial  vascular flow voids are  maintained. Skull and upper cervical spine: Craniocervical junction within normal limits. Bone marrow signal intensity normal. No scalp soft tissue abnormality. Sinuses/Orbits: Globes and orbital soft tissues within normal limits. Scattered mucosal thickening present about the frontoethmoidal and maxillary sinuses. No significant mastoid effusion. Other: None. IMPRESSION: 1. 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 Associated mild petechial blood products without frank hemorrhagic transformation or significant regional mass effect. 2. Underlying moderately advanced chronic microvascular ischemic disease. Electronically Signed   By: Rise Mu M.D.   On: 08/04/2023 22:09   ECHOCARDIOGRAM COMPLETE  Result Date: 08/04/2023    ECHOCARDIOGRAM REPORT   Patient Name:   Priscilla Wilson Date of Exam: 08/04/2023 Medical Rec #:  [id number]      Height:       60.0 in Accession #:    [id number]     Weight:       137.8 lb Date of Birth:  April 27, 1951      BSA:          1.593 m Patient Age:    72 years       BP:           197/97 mmHg Patient Gender: F              HR:           80 bpm. Exam Location:  Inpatient Procedure: 2D Echo, Cardiac Doppler and Color Doppler Indications:    Stroke I63.9  History:        Patient has no prior history of Echocardiogram examinations. TIA                 and Stroke; Risk Factors:Non-Smoker.  Sonographer:    Aron Baba Referring Phys: [id number] [facility] KHALIQDINA IMPRESSIONS  1. Left ventricular ejection fraction, by estimation, is 70 to 75%. The left ventricle has hyperdynamic function. The left ventricle has no regional wall motion abnormalities. Left ventricular diastolic parameters are consistent with Grade I diastolic dysfunction (impaired relaxation).  2. Right ventricular systolic function is hyperdynamic. The right ventricular size is normal. There is normal pulmonary artery systolic pressure.  3. The mitral valve is grossly normal. No evidence of mitral valve  regurgitation. No evidence of mitral stenosis.  4. The aortic valve is tricuspid. Aortic valve regurgitation is not visualized. Comparison(s): No prior Echocardiogram. FINDINGS  Left Ventricle: Small (6 mm Hg) intracavitary gradient related to hyperdynamic function. Left ventricular ejection fraction, by estimation, is 70 to 75%. The left ventricle has hyperdynamic function. The left ventricle has no regional wall motion abnormalities. The left ventricular internal cavity size was small. There is no left ventricular hypertrophy. Left ventricular diastolic parameters are consistent with Grade I diastolic dysfunction (impaired relaxation). Right Ventricle: The right ventricular size is normal. No increase in right ventricular wall thickness. Right ventricular systolic function is hyperdynamic. There is normal pulmonary artery systolic pressure. The tricuspid regurgitant velocity is 1.93 m/s, and with an assumed right atrial pressure of 8 mmHg, the estimated right ventricular systolic pressure is 22.9 mmHg. Left Atrium: Left atrial size was normal in size. Right Atrium: Right atrial size was normal in size. Pericardium: There is no evidence of pericardial effusion. Mitral Valve: The mitral valve is grossly normal. No evidence of mitral valve regurgitation. No evidence of mitral valve stenosis. Tricuspid Valve: The tricuspid valve is normal in structure. Tricuspid valve regurgitation is not demonstrated. No evidence of tricuspid stenosis. Aortic Valve: The aortic valve is tricuspid. Aortic valve regurgitation is  not visualized. Pulmonic Valve: The pulmonic valve was not well visualized. Pulmonic valve regurgitation is not visualized. No evidence of pulmonic stenosis. Aorta: The aortic root and ascending aorta are structurally normal, with no evidence of dilitation. IAS/Shunts: The atrial septum is grossly normal.  RIGHT VENTRICLE RV S prime:     19.20 cm/s TAPSE (M-mode): 1.2 cm TRICUSPID VALVE TR Peak grad:   14.9 mmHg  TR Vmax:        193.00 cm/s Riley Lam MD Electronically signed by Riley Lam MD Signature Date/Time: 08/04/2023/9:48:05 AM    Final     Vitals:   08/05/23 0600 08/05/23 0652 08/05/23 0700 08/05/23 0800  BP:  (!) 151/103 (!) 152/105   Pulse: 73 76 71   Resp: 18 14 (!) 21   Temp:    98.5 F (36.9 C)  TempSrc:    Oral  SpO2: 93% 95% 92%   Weight:      Height:       PHYSICAL EXAM General:  Alert, well-nourished, well-developed Caucasian female patient in no acute distress Psych:  Mood and affect appropriate for situation, patient appears anxious throughout exam CV: Tachycardia with a rate up to 140s on cardiac monitor Respiratory:  Regular, unlabored respirations on room air GI: Abdomen soft and nontender  NEURO:  Mental Status: AA&Ox3, patient is able to give clear and coherent history of present illness Speech/Language: speech is without dysarthria or aphasia.  Naming, repetition, fluency, and comprehension intact.  Cranial Nerves:  II: PERRL. Visual fields full.  III, IV, VI: EOMI. Eyelids elevate symmetrically.  V: Sensation is intact to light touch is reported "very similar" on the left compared to the right.  Possible subtle sensory disturbance to the left face VII: Face is symmetrical resting and smiling VIII: Hearing intact to voice. IX, X: Palate elevates symmetrically. Phonation is normal.  XI: Shoulder shrug 5/5. XII: Tongue protrudes midline Motor: There is pseudo extremity drift of the left upper extremity, improved with eyes open.  There is loss of proprioception to the left hand with astereognosis.  5/5 strength throughout bilateral upper and lower extremities on confrontational strength testing Tone: is normal and bulk is normal Sensation: Intact to light touch bilateral lower extremities.  Decreased sensation to light touch to the left upper extremity and face, though improved from yesterday. Decreased vibration and proprioception on the left upper  extremity. Coordination: FTN intact bilaterally, HKS: no ataxia in BLE.No drift.  Gait: Deferred  ASSESSMENT/PLAN  Acute Ischemic Infarct: Acute ischemic right thalamic capsular infarct  Etiology: Cryptogenic, suspect possible atrial fibrillation  CT head no acute intracranial hemorrhage or evidence of acute large vessel territory infarct.  Aspect score is 10.  Mild chronic small vessel disease. CTA head & neck no LVO, hemodynamically significant stenosis, or aneurysm in the head or neck. 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  Associated mild petechial blood products without frank hemorrhagic transformation or significant regional mass effect.  Underlying moderately advanced chronic microvascular ischemic disease. 2D Echo LVEF 70 to 75%.  The left ventricle has hyperdynamic function.  Right ventricular systolic function is hyperdynamic. Will need 30-day event monitor at discharge LDL 88 HgbA1c 6.3% VTE prophylaxis - ambulate as tolerated, okay to start DVT prophylaxis No antithrombotic prior to admission, now on aspirin 81 mg daily and clopidogrel 75 mg daily for 3 weeks then aspirin alone Therapy recommendations: OT recommending CIR, PT recommendations pending Disposition: Pending  Hypertension Home meds: None Unstable requiring Cleviprex drip during  admission, Cleviprex off since 08/04/2023 Blood Pressure Goal: BP less than 180/105  Metoprolol 25 mg twice daily initiated due to tachycardia and hypertension As needed labetalol and hydralazine ordered  Hyperlipidemia Home meds: Simvastatin 10 mg LDL 88, goal < 70 Increased simvastatin to 40 mg p.o. daily Continue statin at discharge  Other Stroke Risk Factors   Other Active Problems Hyperthyroidism Continued home Methimazole TSH slightly elevated, T3 and free T4 unremarkable Tachycardia and anxiety significantly improved on cardiac monitor today with rate in the 80s  Hospital day # 2  Lanae Boast, AGACNP-BC [facility] Pager: [phone]  I have personally obtained history,examined this patient, reviewed notes, independently viewed imaging studies, participated in medical decision making and plan of care.ROS completed by me personally and pertinent positives fully documented  I have made any additions or clarifications directly to the above note. Agree with note above.  Patient continues to have left-sided proprioceptive deficits but is neurologically stable.  Continue mobilization out of bed.  Therapy consults.  Transfer out of ICU.  Likely need rehab.  Will need prolonged cardiac monitoring at discharge to look for paroxysmal A-fib.  Aspirin and Plavix for 3 weeks followed by aspirin alone Long discussion with patient and husband at the bedside.  Also spoke to Dr. Hipolito Bayley, ER, MD upon patient's request as she is a close family friend.This patient is critically ill and at significant risk of neurological worsening, death and care requires constant monitoring of vital signs, hemodynamics,respiratory and cardiac monitoring, extensive review of multiple databases, frequent neurological assessment, discussion with family, other specialists and medical decision making of high complexity.I have made any additions or clarifications directly to the above note.This critical care time does not reflect procedure time, or teaching time or supervisory time of PA/NP/Med Resident etc but could involve care discussion time.  I spent 30 minutes of neurocritical care time  in the care of  this patient.      Delia Heady, MD Medical Director [facility] Pager: [phone] 08/05/2023 4:26 PM  To contact Stroke Continuity provider, please refer to [email or web address]. After hours, contact General Neurology

## 2023-08-05 NOTE — Progress Notes (Signed)
Pt admitted to 3W13 at this time.  Alert & oriented x 4. Denies any pain or discomfort. Telemetry placed on patient, CCMD called and second verification completed.  Bed alarm set, bed in lowest position, call bell within reach.

## 2023-08-06 DIAGNOSIS — G459 Transient cerebral ischemic attack, unspecified: Secondary | ICD-10-CM | POA: Diagnosis not present

## 2023-08-06 LAB — BASIC METABOLIC PANEL
Anion gap: 9 (ref 5–15)
BUN: 24 mg/dL — ABNORMAL HIGH (ref 8–23)
CO2: 19 mmol/L — ABNORMAL LOW (ref 22–32)
Calcium: 9.2 mg/dL (ref 8.9–10.3)
Chloride: 112 mmol/L — ABNORMAL HIGH (ref 98–111)
Creatinine, Ser: 0.85 mg/dL (ref 0.44–1.00)
GFR, Estimated: >60 mL/min (ref >60)
Glucose, Bld: 57 mg/dL — ABNORMAL LOW (ref 70–99)
Potassium: 3.6 mmol/L (ref 3.5–5.1)
Sodium: 140 mmol/L (ref 135–145)

## 2023-08-06 LAB — GLUCOSE, CAPILLARY
Glucose-Capillary: 165 mg/dL — ABNORMAL HIGH (ref 70–99)
Glucose-Capillary: 57 mg/dL — ABNORMAL LOW (ref 70–99)
Glucose-Capillary: 122 mg/dL — ABNORMAL HIGH (ref 70–99)
Glucose-Capillary: 115 mg/dL — ABNORMAL HIGH (ref 70–99)
Glucose-Capillary: 116 mg/dL — ABNORMAL HIGH (ref 70–99)

## 2023-08-06 LAB — CBC
HCT: 43.4 % (ref 36.0–46.0)
Hemoglobin: 13.8 g/dL (ref 12.0–15.0)
MCH: 27 pg (ref 26.0–34.0)
MCHC: 31.8 g/dL (ref 30.0–36.0)
MCV: 84.9 fL (ref 80.0–100.0)
Platelets: 325 10*3/uL (ref 150–400)
RBC: 5.11 MIL/uL (ref 3.87–5.11)
RDW: 13.5 % (ref 11.5–15.5)
WBC: 10.8 10*3/uL — ABNORMAL HIGH (ref 4.0–10.5)
nRBC: 0 % (ref 0.0–0.2)

## 2023-08-06 MED ORDER — INSULIN ASPART 100 UNIT/ML IJ SOLN: 0.0000 [IU] | Freq: Three times a day (TID) | INTRAMUSCULAR | Status: DC

## 2023-08-06 NOTE — Progress Notes (Addendum)
STROKE TEAM PROGRESS NOTE   BRIEF HPI [name] is a 72 y.o. female with history of GERD, HLD, asthma presenting initially to [facility] ED with acute onset of left-sided numbness and incoordination at home.  Symptoms fluctuated while at outside hospital with brief improvement followed by return of symptoms prompting code stroke activation and patient was subsequently given TNKase by teleneurology.  Patient was then transported to [facility] hospital for further neurology evaluation.  SIGNIFICANT HOSPITAL EVENTS 9/23: TNK administered 9/23 via teleneurology.  Patient transported to [facility] for further neurology evaluation and stroke workup. 9/24: Patient reports disturbed sleep overnight.  Appears anxious on examination this morning.  Patient reports missing her thyroid medication this morning.  Requires Cleviprex gtt for blood pressure control. 9/24: Methimazole initiated with improvement in heart rate, anxiety.  Metoprolol ordered with improvement in blood pressure, Cleviprex off.  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 9/25: Patient stabilized for transfer out of the ICU.  INTERIM HISTORY/SUBJECTIVE Patient is seen in her room with 1 family member at the bedside.  She has been hemodynamically stable overnight and has been moved out of the ICU.  Her neurological exam is somewhat improved with continued pseudo chorea to the left arm and leg as well as improved stereognosis in the left hand. OBJECTIVE CBC    Component Value Date/Time   WBC 11.4 (H) 08/03/2023 1809   RBC 4.88 08/03/2023 1809   HGB 13.6 08/03/2023 1809   HCT 41.3 08/03/2023 1809   PLT 315 08/03/2023 1809   MCV 84.6 08/03/2023 1809   MCH 27.9 08/03/2023 1809   MCHC 32.9 08/03/2023 1809   RDW 13.5 08/03/2023 1809   LYMPHSABS 1.2 08/03/2023 1809    MONOABS 0.4 08/03/2023 1809   EOSABS 0.0 08/03/2023 1809   BASOSABS 0.0 08/03/2023 1809   BMET    Component Value Date/Time   NA 138 08/03/2023 1809   K 3.8 08/03/2023 1809   CL 105 08/03/2023 1809   CO2 21 (L) 08/03/2023 1809   GLUCOSE 252 (H) 08/03/2023 1809   BUN 22 08/03/2023 1809   CREATININE 0.80 08/03/2023 1809   CALCIUM 9.8 08/03/2023 1809   GFRNONAA >60 08/03/2023 1809   IMAGING past 24 hours No results found.  Vitals:   08/05/23 2029 08/05/23 2337 08/06/23 0349 08/06/23 0849  BP: (!) 147/82 (!) 163/86 (!) 186/99 (!) 148/78  Pulse: 86 73 65 79  Resp: 18 18 18 17   Temp: 98.4 F (36.9 C) 98.7 F (37.1 C) 98.4 F (36.9 C) 98.2 F (36.8 C)  TempSrc: Oral Oral Oral Oral  SpO2: 96% 96% 97% 96%  Weight:      Height:       PHYSICAL EXAM General:  Alert, well-nourished, well-developed Caucasian female patient in no acute distress Psych:  Mood and affect appropriate for situation CV: Giller rate and rhythm on monitor Respiratory:  Regular, unlabored respirations on room air GI: Abdomen soft and nontender  NEURO:  Mental Status: AA&Ox3, patient is able to give clear and coherent history of present illness Speech/Language: speech is without dysarthria or aphasia.    Cranial Nerves:  II: PERRL. Visual fields full.  III, IV, VI: EOMI. Eyelids elevate symmetrically.  V: Sensation is intact to light touch is intact and symmetrical to both sides of face VII: Face is symmetrical resting and smiling VIII: Hearing intact to voice. IX,  X: Phonation is normal.  XI: Shoulder shrug 5/5. XII: Tongue protrudes midline Motor: There is pseudo extremity drift of the left upper extremity, improved with eyes open.  There is loss of proprioception to the left hand with improved stereognosis from yesterday.  5/5 strength throughout bilateral upper and lower extremities on confrontational strength testing Tone: is normal and bulk is normal Sensation: Intact to light touch bilateral  upper and lower extremities.   Coordination: FTN intact bilaterally, with pseudo ataxia in the left arm Gait: Deferred  ASSESSMENT/PLAN  Acute Ischemic Infarct: Acute ischemic right thalamic capsular infarct  Etiology: Cryptogenic, suspect possible atrial fibrillation  CT head no acute intracranial hemorrhage or evidence of acute large vessel territory infarct.  Aspect score is 10.  Mild chronic small vessel disease. CTA head & neck no LVO, hemodynamically significant stenosis, or aneurysm in the head or neck. 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  Associated mild petechial blood products without frank hemorrhagic transformation or significant regional mass effect.  Underlying moderately advanced chronic microvascular ischemic disease. 2D Echo LVEF 70 to 75%.  The left ventricle has hyperdynamic function.  Right ventricular systolic function is hyperdynamic. Will need 30-day event monitor at discharge LDL 88 HgbA1c 6.3% VTE prophylaxis - ambulate as tolerated, okay to start DVT prophylaxis No antithrombotic prior to admission, now on aspirin 81 mg daily and clopidogrel 75 mg daily for 3 weeks then aspirin alone Therapy recommendations: OT and PT recommending CIR Disposition: Pending  Hypertension Home meds: None Unstable requiring Cleviprex drip during admission, Cleviprex off since 08/04/2023 Blood Pressure Goal: BP less than 180/105  Metoprolol 25 mg twice daily initiated due to tachycardia and hypertension As needed labetalol and hydralazine ordered  Hyperlipidemia Home meds: Simvastatin 10 mg LDL 88, goal < 70 Increased simvastatin to 40 mg p.o. daily Continue statin at discharge   Other Active Problems Hyperthyroidism Continued home Methimazole TSH slightly elevated, T3 and free T4 unremarkable Tachycardia and anxiety significantly improved on cardiac monitor today with rate in the 80s  Hospital day # 3  Patient seen by NP and then by MD,  MD to edit note as needed. Cortney E Ernestina Columbia , MSN, AGACNP-BC [facility] See Amion for schedule and pager information 08/06/2023 12:18 PM  I have personally obtained history,examined this patient, reviewed notes, independently viewed imaging studies, participated in medical decision making and plan of care.ROS completed by me personally and pertinent positives fully documented  I have made any additions or clarifications directly to the above note. Agree with note above.  Continue ongoing therapies and mobilize out of bed.  Patient will benefit with transfer to inpatient rehab when bed available.  Medically stable to be transferred to rehab when bed is available.  Long discussion patient and husband and answered questions.  Greater than 50% time during this 35-minute visit was spent in counseling and coordination of care about her stroke and discussion about need for therapy and rehab and answering questions.  Delia Heady, MD Medical Director [facility] Stroke Center Pager: [phone] 08/06/2023 3:14 PM   To contact Stroke Continuity provider, please refer to [email or web address]. After hours, contact General Neurology

## 2023-08-06 NOTE — Progress Notes (Signed)
Inpatient Rehab Admissions Coordinator Note:   Per therapy recommendations patient was screened for CIR candidacy by Stephania Fragmin, PT. At this time, pt appears to be a potential candidate for CIR. I will place an order for rehab consult for full assessment, per our protocol.  Please contact me any with questions.Estill Dooms, PT, DPT [phone] 08/06/23 2:11 PM

## 2023-08-06 NOTE — Care Management Important Message (Signed)
Important Message  Patient Details  Name: Priscilla Wilson MRN: [id number] Date of Birth: June 08, 1951   Important Message Given:  Yes - Medicare IM     Dorena Bodo 08/06/2023, 2:55 PM

## 2023-08-06 NOTE — Progress Notes (Addendum)
Physical Therapy Treatment Patient Details Name: Priscilla Wilson MRN: [id number] DOB: Nov 08, 1951 Today's Date: 08/06/2023   History of Present Illness Pt is 72 yo female who presents on 08/03/23 with ataxia and L sided numbness who received TNK. 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 PMH: GERD, asthma, dyslipidemia    PT Comments  Pt received in supine, agreeable to therapy session and with good participation and tolerance for transfer and gait training with close chair follow for safety. Pt needing consistent modA for sequencing RW and steps during gait trial due to decreased L motor control/coordination and tendency to L knee hyperextension. Pt making good progress toward goals and very pleased with her progress today, she reports minimal fatigue <5/10 modified RPE at end of session. Pt continues to benefit from PT services to progress toward functional mobility goals, she remains an excellent candidate for short term high intensity post-acute rehab >3 hours/day.    If plan is discharge home, recommend the following: A lot of help with walking and/or transfers;A lot of help with bathing/dressing/bathroom;Assistance with cooking/housework;Assist for transportation;Help with stairs or ramp for entrance   Can travel by private vehicle        Equipment Recommendations  BSC/3in1    Recommendations for Other Services Rehab consult     Precautions / Restrictions Precautions Precautions: Fall Precaution Comments: BP goal <180/105 Restrictions Weight Bearing Restrictions: No     Mobility  Bed Mobility Overal bed mobility: Needs Assistance Bed Mobility: Supine to Sit     Supine to sit: Contact guard     General bed mobility comments: Assist with linens, pt using bed rail to assist with sitting up    Transfers Overall transfer level: Needs assistance Equipment used: Rolling walker (2 wheels), None Transfers: Sit to/from Stand Sit to Stand: Min assist            General transfer comment: EOB<>RW and chair<>RW. Cues provided to pt on hand placement, minA to steady pt coming to stand from EOB to RW. Pt c/o difficulty maintaining LUE grip on RW but able to manage this session, notably weak through L wrist through pronator musculature.    Ambulation/Gait Ambulation/Gait assistance: Mod assist Gait Distance (Feet): 50 Feet (x2 with seated break) Assistive device: Rolling walker (2 wheels) Gait Pattern/deviations: Step-through pattern, Decreased step length - right, Decreased stride length, Ataxic, Step-to pattern, Knee hyperextension - left, Decreased dorsiflexion - right, Decreased dorsiflexion - left Gait velocity: reduced     General Gait Details: Pt takes slow, unsteady, ataxic steps with noted L knee hyperextension to prevent buckling and weak LUE but able to maintain grip this date, but difficulty due to wrist weakness and often supinating L wrist around handle due to L pronation musculature weakness. Pt needed repeated cues for L step length/foot positioning, RW management and step sequencing to reduce pressure on her posterior L knee structures. Pt did better with dense cues for sequencing.  "walker, L small step, bend L hip/knee, now tighten L knee while stepping with RLE" etc. Manual assist at front of L knee to prevent buckling/for increased stability. Pt c/o L ankle weakness/concern for foot drop but pt able to maintain neutral L ankle position most of trial. Close chair follow for safety provided by pt spouse, seated break halfway through as LUE fatigued.   Stairs             Wheelchair Mobility     Tilt Bed    Modified Rankin (Stroke Patients Only) Modified Rankin (Stroke Patients  Only) Pre-Morbid Rankin Score: No symptoms Modified Rankin: Moderately severe disability     Balance Overall balance assessment: Needs assistance Sitting-balance support: No upper extremity supported, Feet supported Sitting balance-Leahy Scale:  Fair Sitting balance - Comments: Can sit statically with CGA for safety   Standing balance support: Bilateral upper extremity supported, During functional activity, Reliant on assistive device for balance Standing balance-Leahy Scale: Poor Standing balance comment: Reliant on RW and modA                            Cognition Arousal: Alert Behavior During Therapy: WFL for tasks assessed/performed Overall Cognitive Status: Within Functional Limits for tasks assessed                                 General Comments: Pt states she worked in [employer] prior to retirement, seems to have good understanding of deficits/motivated to progress. Spouse present and supportive, uses cane PRN but able to assist with wheeled chair follow during session.        Exercises Other Exercises Other Exercises: reviewed seated LLE AROM: hip flexion, LAQ x10 reps ea STS x 3 reps for strengthening   General Comments General comments (skin integrity, edema, etc.): BP 158/90 (110) HR 72 bpm SpO2 98% sitting EOB; BP 158/84 (107) HR 67 bpm sitting in chair post-exertion.      Pertinent Vitals/Pain Pain Assessment Pain Assessment: No/denies pain Faces Pain Scale: No hurt     PT Goals (current goals can now be found in the care plan section) Acute Rehab PT Goals Patient Stated Goal: to improve PT Goal Formulation: With patient/family Time For Goal Achievement: 08/19/23 Progress towards PT goals: Progressing toward goals    Frequency    Min 1X/week      PT Plan      Co-evaluation              AM-PAC PT "6 Clicks" Mobility   Outcome Measure  Help needed turning from your back to your side while in a flat bed without using bedrails?: None Help needed moving from lying on your back to sitting on the side of a flat bed without using bedrails?: A Little Help needed moving to and from a bed to a chair (including a wheelchair)?: A Lot Help needed standing up from a  chair using your arms (e.g., wheelchair or bedside chair)?: A Lot Help needed to walk in hospital room?: A Lot Help needed climbing 3-5 steps with a railing? : Total 6 Click Score: 14    End of Session Equipment Utilized During Treatment: Gait belt Activity Tolerance: Patient tolerated treatment well Patient left: in chair;with call bell/phone within reach;with family/visitor present;Other (comment) (RN notified chair alarm off, pt A&O able to notify staff when ready to return to bed from the chair) Nurse Communication: Mobility status PT Visit Diagnosis: Unsteadiness on feet (R26.81);Other abnormalities of gait and mobility (R26.89);Muscle weakness (generalized) (M62.81);Difficulty in walking, not elsewhere classified (R26.2);Other symptoms and signs involving the nervous system (R29.898);Hemiplegia and hemiparesis Hemiplegia - Right/Left: Left Hemiplegia - caused by: Cerebral infarction     Time: 1331-1403 PT Time Calculation (min) (ACUTE ONLY): 32 min  Charges:    $Gait Training: 8-22 mins $Therapeutic Exercise: 8-22 mins PT General Charges $$ ACUTE PT VISIT: 1 Visit  Florina Ou., PTA Acute Rehabilitation Services Secure Chat Preferred 9a-5:30pm Office: [phone]    Dorathy Kinsman [facility] 08/06/2023, 5:21 PM

## 2023-08-06 NOTE — Inpatient Diabetes Management (Signed)
Inpatient Diabetes Program Recommendations  AACE/ADA: New Consensus Statement on Inpatient Glycemic Control (2015)  Target Ranges:  Prepandial:   less than 140 mg/dL      Peak postprandial:   less than 180 mg/dL (1-2 hours)      Critically ill patients:  140 - 180 mg/dL   Lab Results  Component Value Date   GLUCAP 122 (H) 08/06/2023   HGBA1C 6.3 (H) 08/04/2023    Review of Glycemic Control  Latest Reference Range & Units 08/05/23 16:18 08/06/23 09:44 08/06/23 11:21 08/06/23 11:46  Glucose-Capillary 70 - 99 mg/dL 161 (H) 096 (H) 57 (L) 122 (H)   Diabetes history:  None Current orders for Inpatient glycemic control:  Novolog 0-9 units tid with meals   Inpatient Diabetes Program Recommendations:    Consider further reduction of Novolog to "very sensitive" 0-6 units.   Thanks,  Beryl Meager, RN, BC-ADM Inpatient Diabetes Coordinator Pager [phone]  (8a-5p)

## 2023-08-06 NOTE — Plan of Care (Signed)
  Problem: Education: Goal: Knowledge of disease or condition will improve Outcome: Progressing   Problem: Ischemic Stroke/TIA Tissue Perfusion: Goal: Complications of ischemic stroke/TIA will be minimized Outcome: Progressing   Problem: Coping: Goal: Will verbalize positive feelings about self Outcome: Progressing Goal: Will identify appropriate support needs Outcome: Progressing   Problem: Self-Care: Goal: Ability to participate in self-care as condition permits will improve Outcome: Progressing Goal: Verbalization of feelings and concerns over difficulty with self-care will improve Outcome: Progressing Goal: Ability to communicate needs accurately will improve Outcome: Progressing   Problem: Nutrition: Goal: Risk of aspiration will decrease Outcome: Progressing   Problem: Coping: Goal: Ability to adjust to condition or change in health will improve Outcome: Progressing

## 2023-08-07 DIAGNOSIS — I639 Cerebral infarction, unspecified: Secondary | ICD-10-CM | POA: Diagnosis not present

## 2023-08-07 DIAGNOSIS — G459 Transient cerebral ischemic attack, unspecified: Secondary | ICD-10-CM | POA: Diagnosis not present

## 2023-08-07 LAB — GLUCOSE, CAPILLARY
Glucose-Capillary: 118 mg/dL — ABNORMAL HIGH (ref 70–99)
Glucose-Capillary: 132 mg/dL — ABNORMAL HIGH (ref 70–99)
Glucose-Capillary: 142 mg/dL — ABNORMAL HIGH (ref 70–99)
Glucose-Capillary: 134 mg/dL — ABNORMAL HIGH (ref 70–99)

## 2023-08-07 NOTE — Progress Notes (Addendum)
STROKE TEAM PROGRESS NOTE   BRIEF HPI [name] is a 72 y.o. female with history of GERD, HLD, asthma presenting initially to [facility] ED with acute onset of left-sided numbness and incoordination at home.  Symptoms fluctuated while at outside hospital with brief improvement followed by return of symptoms prompting code stroke activation and patient was subsequently given TNKase by teleneurology.  Patient was then transported to [facility] hospital for further neurology evaluation.  SIGNIFICANT HOSPITAL EVENTS 9/23: TNK administered 9/23 via teleneurology.  Patient transported to [facility] via [facility] for further neurology evaluation and stroke workup. 9/24: Patient reports disturbed sleep overnight.  Appears anxious on examination this morning.  Patient reports missing her thyroid medication this morning.  Requires Cleviprex gtt for blood pressure control. 9/24: Methimazole initiated with improvement in heart rate, anxiety.  Metoprolol ordered with improvement in blood pressure, Cleviprex off.  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 9/25: Patient stabilized for transfer out of the ICU.  INTERIM HISTORY/SUBJECTIVE Patient is seen in her room with no family at the bedside.  She remains hemodynamically stable, and her neurological exam is improved with less pseudo chorea and better performance on coordination testing.  OBJECTIVE CBC    Component Value Date/Time   WBC 10.8 (H) 08/06/2023 1102   RBC 5.11 08/06/2023 1102   HGB 13.8 08/06/2023 1102   HCT 43.4 08/06/2023 1102   PLT 325 08/06/2023 1102   MCV 84.9 08/06/2023 1102   MCH 27.0 08/06/2023 1102   MCHC 31.8 08/06/2023 1102   RDW 13.5 08/06/2023 1102   LYMPHSABS 1.2 08/03/2023 1809   MONOABS 0.4 08/03/2023 1809   EOSABS 0.0 08/03/2023 1809   BASOSABS 0.0 08/03/2023  1809   BMET    Component Value Date/Time   NA 140 08/06/2023 1102   K 3.6 08/06/2023 1102   CL 112 (H) 08/06/2023 1102   CO2 19 (L) 08/06/2023 1102   GLUCOSE 57 (L) 08/06/2023 1102   BUN 24 (H) 08/06/2023 1102   CREATININE 0.85 08/06/2023 1102   CALCIUM 9.2 08/06/2023 1102   GFRNONAA >60 08/06/2023 1102   IMAGING past 24 hours No results found.  Vitals:   08/06/23 2339 08/07/23 0342 08/07/23 0810 08/07/23 1137  BP: (!) 172/69 138/79 (!) 183/80 (!) 143/81  Pulse: 69 60 71 (!) 57  Resp: 18 18 18 18   Temp: 98.2 F (36.8 C) 98.2 F (36.8 C) 99 F (37.2 C) 98 F (36.7 C)  TempSrc: Oral Oral Oral Oral  SpO2: 97% 98% 95% 96%  Weight:      Height:       PHYSICAL EXAM General:  Alert, well-nourished, well-developed Caucasian female patient in no acute distress Psych:  Mood and affect appropriate for situation CV: Giller rate and rhythm on monitor Respiratory:  Regular, unlabored respirations on room air GI: Abdomen soft and nontender  NEURO:  Mental Status: AA&Ox3, patient is able to give clear and coherent history of present illness Speech/Language: speech is without dysarthria or aphasia.    Cranial Nerves:  II: PERRL. Visual fields full.  III, IV, VI: EOMI. Eyelids elevate symmetrically.  V: Sensation is intact to light touch is intact and symmetrical to both sides of face VII: Face is symmetrical resting and smiling VIII: Hearing intact to voice. IX, X: Phonation is normal.  XI: Shoulder shrug 5/5. XII: Tongue protrudes midline Motor: There is pseudo extremity  drift of the left upper extremity, improved with eyes open, and much improved from yesterday.   5/5 strength throughout bilateral upper and lower extremities on confrontational strength testing Tone: is normal and bulk is normal Sensation: Intact to light touch bilateral upper and lower extremities.   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  Aspect score is 10.  Mild chronic small vessel disease. CTA head & neck no LVO, hemodynamically significant stenosis, or aneurysm in the head or neck. 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  Associated mild petechial blood products without frank hemorrhagic transformation or significant regional mass effect.  Underlying moderately advanced chronic microvascular ischemic disease. 2D Echo LVEF 70 to 75%.  The left ventricle has hyperdynamic function.  Right ventricular systolic function is hyperdynamic. Will need 30-day event monitor at discharge LDL 88 HgbA1c 6.3% VTE prophylaxis - ambulate as tolerated, okay to start DVT prophylaxis No antithrombotic prior to admission, now on aspirin 81 mg daily and clopidogrel 75 mg daily for 3 weeks then aspirin alone Therapy recommendations: OT and PT recommending CIR Disposition: Pending  Hypertension Home meds: None Unstable requiring Cleviprex drip during admission, Cleviprex off since 08/04/2023 Blood Pressure Goal: BP less than 180/105  Metoprolol 25 mg twice daily initiated due to tachycardia and hypertension As needed labetalol and hydralazine ordered  Hyperlipidemia Home meds: Simvastatin 10 mg LDL 88, goal < 70 Increased simvastatin to 40 mg p.o. daily Continue statin at discharge   Other Active Problems Hyperthyroidism Continued home Methimazole TSH slightly elevated, T3 and free T4 unremarkable Tachycardia and anxiety significantly improved on cardiac monitor today with rate in the 80s  Hospital day # 4  Patient seen by NP and then by MD, MD to edit note as needed. Cortney E Ernestina Columbia , MSN, AGACNP-BC [facility] See Amion for  schedule and pager information 08/07/2023 1:19 PM I have personally obtained history,examined this patient, reviewed notes, independently viewed imaging studies, participated in medical decision making and plan of care.ROS completed by me personally and pertinent positives fully documented  I have made any additions or clarifications directly to the above note. Agree with note above.  Patient continues to show gradual neurological improvement and has less left-sided proprioceptive deficits today.  Continue mobilization out of bed and ongoing therapy consults.  Transfer to inpatient rehab when bed available after insurance approval.  Delia Heady, MD Medical Director [facility] Stroke Center Pager: [phone] 08/07/2023 2:58 PM  To contact Stroke Continuity provider, please refer to [email or web address]. After hours, contact General Neurology

## 2023-08-07 NOTE — Progress Notes (Signed)
Occupational Therapy Treatment Patient Details Name: Priscilla Wilson MRN: [id number] DOB: 07/26/1951 Today's Date: 08/07/2023   History of present illness Pt is 72 yo female who presents on 08/03/23 with ataxia and L sided numbness who received TNK. 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 PMH: GERD, asthma, dyslipidemia   OT comments  Patient continues to make steady progress towards goals in skilled OT session. Patient's session focus on increasing overall awareness with LUE (FMC exercises provided but did not provide entire handout) and ADLs in standing. Patient needing up to mod A to navigate rolling walker when turning and max cues for placement of LLE. Recommendation remains appopriate, OT will continue to follow.       If plan is discharge home, recommend the following:  A lot of help with walking and/or transfers;Assistance with cooking/housework;Assist for transportation;Help with stairs or ramp for entrance;A little help with bathing/dressing/bathroom   Equipment Recommendations  Other (comment) (defer to next venue)    Recommendations for Other Services      Precautions / Restrictions Precautions Precautions: Fall Precaution Comments: BP goal <180/105 Restrictions Weight Bearing Restrictions: No       Mobility Bed Mobility Overal bed mobility: Needs Assistance Bed Mobility: Supine to Sit     Supine to sit: Contact guard     General bed mobility comments: Cues for pacing    Transfers Overall transfer level: Needs assistance Equipment used: Rolling walker (2 wheels) Transfers: Sit to/from Stand Sit to Stand: Min assist           General transfer comment: Cues for hand placement and pacing, improved ability to maintain LUE on RW     Balance Overall balance assessment: Needs assistance Sitting-balance support: No upper extremity supported, Feet supported Sitting balance-Leahy Scale: Fair Sitting balance - Comments: Can sit statically with  CGA for safety   Standing balance support: Bilateral upper extremity supported, During functional activity, Reliant on assistive device for balance Standing balance-Leahy Scale: Poor Standing balance comment: Reliant on RW and modA                           ADL either performed or assessed with clinical judgement   ADL Overall ADL's : Needs assistance/impaired Eating/Feeding: Set up;Sitting   Grooming: Wash/dry hands;Wash/dry face;Oral care;Set up;Standing           Upper Body Dressing : Contact guard assist;Sitting       Toilet Transfer: Ambulation;Rolling walker (2 wheels);Moderate assistance Toilet Transfer Details (indicate cue type and reason): close mod A especially for turns         Functional mobility during ADLs: Minimal assistance;Cueing for safety;Cueing for sequencing;Rolling walker (2 wheels) General ADL Comments: Session focus on increasing overall awareness with LUE (FMC exercises provided but did not provide entire handout) and ADLs in standing. Patient needing up to mod A to navigate rolling walker when turning and max cues for placement of LLE. Recommendation remains appopriate, OT will continue to follow.    Extremity/Trunk Assessment              Vision       Perception     Praxis      Cognition Arousal: Alert Behavior During Therapy: WFL for tasks assessed/performed Overall Cognitive Status: Within Functional Limits for tasks assessed  General Comments: Patient motivated and appropriate        Exercises      Shoulder Instructions       General Comments      Pertinent Vitals/ Pain       Pain Assessment Pain Assessment: No/denies pain  Home Living                                          Prior Functioning/Environment              Frequency  Min 1X/week        Progress Toward Goals  OT Goals(current goals can now be found in the care plan  section)  Progress towards OT goals: Progressing toward goals  Acute Rehab OT Goals Patient Stated Goal: to get to rehab OT Goal Formulation: With patient Time For Goal Achievement: 08/19/23 Potential to Achieve Goals: Good  Plan      Co-evaluation                 AM-PAC OT "6 Clicks" Daily Activity     Outcome Measure   Help from another person eating meals?: A Little Help from another person taking care of personal grooming?: A Little Help from another person toileting, which includes using toliet, bedpan, or urinal?: A Lot Help from another person bathing (including washing, rinsing, drying)?: A Little Help from another person to put on and taking off regular upper body clothing?: A Little Help from another person to put on and taking off regular lower body clothing?: A Lot 6 Click Score: 16    End of Session Equipment Utilized During Treatment: Gait belt;Rolling walker (2 wheels)  OT Visit Diagnosis: Unsteadiness on feet (R26.81);Ataxia, unspecified (R27.0)   Activity Tolerance Patient tolerated treatment well   Patient Left in chair;with call bell/phone within reach;with chair alarm set   Nurse Communication Mobility status        Time: 5284-1324 OT Time Calculation (min): 27 min  Charges: OT General Charges $OT Visit: 1 Visit OT Treatments $Self Care/Home Management : 23-37 mins  [name], OTR/L Acute Rehabilitation Services [phone]   Cherlyn Cushing 08/07/2023, 9:24 AM

## 2023-08-07 NOTE — Progress Notes (Signed)
Inpatient Rehab Coordinator Note:  I met with pt at bedside to discuss CIR recommendations and goals/expectations of CIR stay.  We reviewed 3 hrs/day of therapy, physician follow up, and average length of stay 2 weeks (dependent upon progress) with goals of supervision to mod I.  She is home with her spouse who can provide expected level of support.  We will open request with HTA today.   Estill Dooms, PT, DPT Admissions Coordinator [phone] 08/07/23  3:29 PM

## 2023-08-07 NOTE — Plan of Care (Signed)
  Problem: Coping: Goal: Will verbalize positive feelings about self Outcome: Progressing   Problem: Ischemic Stroke/TIA Tissue Perfusion: Goal: Complications of ischemic stroke/TIA will be minimized Outcome: Progressing   

## 2023-08-07 NOTE — Progress Notes (Signed)
Physical Therapy Treatment Patient Details Name: Priscilla Wilson MRN: [id number] DOB: 02-28-1951 Today's Date: 08/07/2023   History of Present Illness Pt is 72 yo female who presents on 08/03/23 with ataxia and L sided numbness who received TNK. 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 PMH: GERD, asthma, dyslipidemia    PT Comments  Patient is motivated to participate with PT. Increased total ambulation distance today, however patient needs several standing rest breaks due to fatigue. Mod A- Min A required for ambulation using rolling walker with continued gait deficits, moderate verbal cues for improved gait kinematics and safety. Recommend to continue PT to maximize independence and facilitate return to prior level of function. Anticipate patient will benefit from intensive rehabilitation after this hospital stay > 3 hours/day.    If plan is discharge home, recommend the following: A lot of help with walking and/or transfers;A lot of help with bathing/dressing/bathroom;Assistance with cooking/housework;Assist for transportation;Help with stairs or ramp for entrance   Can travel by private vehicle        Equipment Recommendations  BSC/3in1    Recommendations for Other Services Rehab consult     Precautions / Restrictions Precautions Precautions: Fall Precaution Comments: BP goal <180/105 Restrictions Weight Bearing Restrictions: No     Mobility  Bed Mobility Overal bed mobility: Needs Assistance Bed Mobility: Supine to Sit, Sit to Supine     Supine to sit: Contact guard Sit to supine: Contact guard assist   General bed mobility comments: increased time required    Transfers Overall transfer level: Needs assistance Equipment used: Rolling walker (2 wheels) Transfers: Sit to/from Stand Sit to Stand: Min assist           General transfer comment: lifting assistance required for walking. cues for hand placement for safety     Ambulation/Gait Ambulation/Gait assistance: Mod assist, Min assist Gait Distance (Feet):  (48ft, 73ft, 28ft) Assistive device: Rolling walker (2 wheels) Gait Pattern/deviations: Step-to pattern, Step-through pattern, Ataxic, Knee hyperextension - left Gait velocity: decreased     General Gait Details: ataxic steps with LLE with varying step length. cues for appropriate foot placement including step length, base of support, and position in relation to rolling walker. intermittent decreased dorsiflexion noted on the left with cues for heel-toe gait pattern. several standing rest breaks required.   Stairs             Wheelchair Mobility     Tilt Bed    Modified Rankin (Stroke Patients Only)       Balance Overall balance assessment: Needs assistance Sitting-balance support: No upper extremity supported, Feet supported Sitting balance-Leahy Scale: Fair Sitting balance - Comments: supervision for safety   Standing balance support: Bilateral upper extremity supported Standing balance-Leahy Scale: Poor Standing balance comment: relying on rolling walker for support in standing                            Cognition Arousal: Alert Behavior During Therapy: WFL for tasks assessed/performed Overall Cognitive Status: Within Functional Limits for tasks assessed                                 General Comments: patient is motivated to participate        Exercises      General Comments        Pertinent Vitals/Pain Pain Assessment Pain Assessment: No/denies pain    Home Living  Prior Function            PT Goals (current goals can now be found in the care plan section) Acute Rehab PT Goals Patient Stated Goal: to improve PT Goal Formulation: With patient/family Time For Goal Achievement: 08/19/23 Potential to Achieve Goals: Good Progress towards PT goals: Progressing toward goals     Frequency    Min 1X/week      PT Plan      Co-evaluation              AM-PAC PT "6 Clicks" Mobility   Outcome Measure  Help needed turning from your back to your side while in a flat bed without using bedrails?: None Help needed moving from lying on your back to sitting on the side of a flat bed without using bedrails?: A Little Help needed moving to and from a bed to a chair (including a wheelchair)?: A Lot Help needed standing up from a chair using your arms (e.g., wheelchair or bedside chair)?: A Lot Help needed to walk in hospital room?: A Lot Help needed climbing 3-5 steps with a railing? : Total 6 Click Score: 14    End of Session Equipment Utilized During Treatment: Gait belt Activity Tolerance: Patient tolerated treatment well Patient left: in bed;with call bell/phone within reach;with bed alarm set Nurse Communication: Mobility status PT Visit Diagnosis: Unsteadiness on feet (R26.81);Other abnormalities of gait and mobility (R26.89);Muscle weakness (generalized) (M62.81);Difficulty in walking, not elsewhere classified (R26.2);Other symptoms and signs involving the nervous system (R29.898);Hemiplegia and hemiparesis Hemiplegia - caused by: Cerebral infarction     Time: 1205-1224 PT Time Calculation (min) (ACUTE ONLY): 19 min  Charges:    $Gait Training: 8-22 mins PT General Charges $$ ACUTE PT VISIT: 1 Visit                     Donna Bernard, PT, MPT   Ina Homes 08/07/2023, 1:19 PM

## 2023-08-07 NOTE — PMR Pre-admission (Signed)
PMR Admission Coordinator Pre-Admission Assessment  Patient: Priscilla Wilson is an 72 y.o., female MRN: [id number] DOB: October 07, 1951 Height: 5' (152.4 cm) Weight: 62.5 kg              Insurance Information HMO:     PPO: yes     PCP:      IPA:      80/20:      OTHER:  PRIMARY: [employer]      Policy#: [id number]      Subscriber: pt CM Name: [employer]      Phone#: [phone]     Fax#: epic access Pre-Cert#: [id number] auth for CIR for admit 9/30 with updates due weekly, [facility] has epic access for review     Employer:  Benefits:  Phone #: [phone]     Name:  Eff. Date: 11/10/22     Deduct: $0      Out of Pocket Max: $3200 ($446 met)      Life Max: n/a  CIR: $295/day for days 1-6      SNF: 20 full days Outpatient:      Co-Pay: $15/visit Home Health: 100%      Co-Pay:  DME: 80%     Co-Pay: 20% Providers:  SECONDARY:       Policy#:       Phone#:   Artist:       Phone#:   The Engineer, materials Information Summary" for patients in Inpatient Rehabilitation Facilities with attached "Privacy Act Statement-Health Care Records" was provided and verbally reviewed with: Patient and Family  Emergency Contact Information Contact Information     Name Relation Home Work Mobile   [place] Spouse [phone]        Other Contacts   None on File    Current Medical History  Patient Admitting Diagnosis: CVA   History of Present Illness: Pt is a 72 y/o female with a history of GERD and asthma who presented initially with left-sided sensory loss and impaired coordination on 08/03/2023. She received TNK initially and was admitted. 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 Neurology feels that the etiology is cryptogenic but may be related to atrial fibrillation. Patient was placed on aspirin 81 mg and Plavix 75 mg daily for 3 weeks then the plan is to  transition to aspirin alone.   Complete NIHSS TOTAL: 5 Glasgow Coma Scale Score: 15  Patient's medical record from [facility] has been reviewed by the rehabilitation admission coordinator and physician.  Past Medical History  History reviewed. No pertinent past medical history.  Has the patient had major surgery during 100 days prior to admission? No  Family History  family history includes Cancer - Ovarian in her sister.   Current Medications   Current Facility-Administered Medications:    acetaminophen (TYLENOL) tablet 650 mg, 650 mg, Oral, Q4H PRN **OR** acetaminophen (TYLENOL) 160 MG/5ML solution 650 mg, 650 mg, Per Tube, Q4H PRN **OR** acetaminophen (TYLENOL) suppository 650 mg, 650 mg, Rectal, Q4H PRN, Erick Blinks, MD   aspirin EC tablet 81 mg, 81 mg, Oral, Daily, Francena Hanly, RPH, 81 mg at 08/07/23 2595   Chlorhexidine Gluconate Cloth 2 % PADS 6 each, 6 each, Topical, Q0600, Erick Blinks, MD, 6 each at 08/06/23 0511   clevidipine (CLEVIPREX) infusion 0.5 mg/mL, 0-21 mg/hr, Intravenous, Continuous, Erick Blinks, MD, Stopped at 08/04/23 1203   clopidogrel (PLAVIX) tablet 75 mg, 75  mg, Oral, Daily, Lanae Boast W, NP, 75 mg at 08/07/23 0842   famotidine (PEPCID) tablet 40 mg, 40 mg, Oral, QHS, Toberman, Stevi W, NP, 40 mg at 08/06/23 2111   hydrALAZINE (APRESOLINE) injection 10 mg, 10 mg, Intravenous, Q4H PRN, Kara Mead, NP, 10 mg at 08/04/23 1809   insulin aspart (novoLOG) injection 0-6 Units, 0-6 Units, Subcutaneous, TID WC, de La Torre, Cortney E, NP   ipratropium-albuterol (DUONEB) 0.5-2.5 (3) MG/3ML nebulizer solution 3 mL, 3 mL, Nebulization, Q4H PRN, Erick Blinks, MD, 3 mL at 08/04/23 1355   labetalol (NORMODYNE) injection 10 mg, 10 mg, Intravenous, Q2H PRN, Cindie Laroche, Stevi W, NP, 10 mg at 08/04/23 1647   methimazole (TAPAZOLE) tablet 5 mg, 5 mg, Oral, Daily, Pearlean Brownie, Pramod S, MD, 5 mg at 08/07/23 0841   metoprolol tartrate (LOPRESSOR)  tablet 25 mg, 25 mg, Oral, BID, Micki Riley, MD, 25 mg at 08/07/23 1914   Oral care mouth rinse, 15 mL, Mouth Rinse, PRN, Erick Blinks, MD   senna-docusate (Senokot-S) tablet 1 tablet, 1 tablet, Oral, QHS PRN, Erick Blinks, MD   simvastatin (ZOCOR) tablet 40 mg, 40 mg, Oral, Daily, Toberman, Stevi W, NP, 40 mg at 08/07/23 0841   venlafaxine XR (EFFEXOR-XR) 24 hr capsule 75 mg, 75 mg, Oral, Q breakfast, Erick Blinks, MD, 75 mg at 08/07/23 7829  Patients Current Diet:  Diet Order             Diet regular Room service appropriate? Yes with Assist; Fluid consistency: Thin  Diet effective now                   Precautions / Restrictions Precautions Precautions: Fall Precaution Comments: BP goal <180/105 Restrictions Weight Bearing Restrictions: No   Has the patient had 2 or more falls or a fall with injury in the past year?No  Prior Activity Level Community (5-7x/wk): indep, active prior to admit, no DME, driving  Prior Functional Level Prior Function Prior Level of Function : Driving, Independent/Modified Independent Mobility Comments: No AD  Self Care: Did the patient need help bathing, dressing, using the toilet or eating?  Independent  Indoor Mobility: Did the patient need assistance with walking from room to room (with or without device)? Independent  Stairs: Did the patient need assistance with internal or external stairs (with or without device)? Independent  Functional Cognition: Did the patient need help planning regular tasks such as shopping or remembering to take medications? Independent  Patient Information Are you of Hispanic, Latino/a,or Spanish origin?: A. No, not of Hispanic, Latino/a, or Spanish origin What is your race?: A. White Do you need or want an interpreter to communicate with a doctor or health care staff?: 0. No  Patient's Response To:  Health Literacy and Transportation Is the patient able to respond to health literacy  and transportation needs?: Yes Health Literacy - How often do you need to have someone help you when you read instructions, pamphlets, or other written material from your doctor or pharmacy?: Never In the past 12 months, has lack of transportation kept you from medical appointments or from getting medications?: No In the past 12 months, has lack of transportation kept you from meetings, work, or from getting things needed for daily living?: No  Home Assistive Devices / Equipment Home Equipment: Agricultural consultant (2 wheels), Shower seat, Grab bars - tub/shower  Prior Device Use: Indicate devices/aids used by the patient prior to current illness, exacerbation or injury? None of the above  Current  Functional Level Cognition  Overall Cognitive Status: Within Functional Limits for tasks assessed Orientation Level: Oriented X4 General Comments: patient is motivated to participate    Extremity Assessment (includes Sensation/Coordination)  Upper Extremity Assessment: Defer to OT evaluation LUE Deficits / Details: Decreased sensation throughout entire L side, ataxic movement with LUE LUE Sensation: decreased light touch LUE Coordination: decreased fine motor, decreased gross motor  Lower Extremity Assessment: LLE deficits/detail LLE Deficits / Details: MMT scores of 4 grossly (weaker than R side); decreased sensation throughout L leg; ataxic movement noted LLE Sensation: decreased light touch, decreased proprioception LLE Coordination: decreased gross motor, decreased fine motor    ADLs  Overall ADL's : Needs assistance/impaired Eating/Feeding: Set up, Sitting Grooming: Wash/dry hands, Wash/dry face, Oral care, Set up, Standing Upper Body Bathing: Contact guard assist, Sitting Lower Body Bathing: Sitting/lateral leans, Sit to/from stand, Moderate assistance Upper Body Dressing : Contact guard assist, Sitting Lower Body Dressing: Minimal assistance, Sit to/from stand, Sitting/lateral  leans Toilet Transfer: Ambulation, Rolling walker (2 wheels), Moderate assistance Toilet Transfer Details (indicate cue type and reason): close mod A especially for turns Toileting- Architect and Hygiene: Sit to/from stand, Sitting/lateral lean, Moderate assistance Functional mobility during ADLs: Minimal assistance, Cueing for safety, Cueing for sequencing, Rolling walker (2 wheels) General ADL Comments: Session focus on increasing overall awareness with LUE (FMC exercises provided but did not provide entire handout) and ADLs in standing. Patient needing up to mod A to navigate rolling walker when turning and max cues for placement of LLE. Recommendation remains appopriate, OT will continue to follow.    Mobility  Overal bed mobility: Needs Assistance Bed Mobility: Supine to Sit, Sit to Supine Supine to sit: Contact guard Sit to supine: Contact guard assist General bed mobility comments: increased time required    Transfers  Overall transfer level: Needs assistance Equipment used: Rolling walker (2 wheels) Transfers: Sit to/from Stand Sit to Stand: Min assist General transfer comment: lifting assistance required for walking. cues for hand placement for safety    Ambulation / Gait / Stairs / Wheelchair Mobility  Ambulation/Gait Ambulation/Gait assistance: Mod assist, Min assist Gait Distance (Feet):  (72ft, 38ft, 76ft) Assistive device: Rolling walker (2 wheels) Gait Pattern/deviations: Step-to pattern, Step-through pattern, Ataxic, Knee hyperextension - left General Gait Details: ataxic steps with LLE with varying step length. cues for appropriate foot placement including step length, base of support, and position in relation to rolling walker. intermittent decreased dorsiflexion noted on the left with cues for heel-toe gait pattern. several standing rest breaks required. Gait velocity: decreased Gait velocity interpretation: <1.31 ft/sec, indicative of household ambulator     Posture / Balance Dynamic Sitting Balance Sitting balance - Comments: supervision for safety Balance Overall balance assessment: Needs assistance Sitting-balance support: No upper extremity supported, Feet supported Sitting balance-Leahy Scale: Fair Sitting balance - Comments: supervision for safety Standing balance support: Bilateral upper extremity supported Standing balance-Leahy Scale: Poor Standing balance comment: relying on rolling walker for support in standing    Special needs/care consideration N/a     Previous Home Environment (from acute therapy documentation) Living Arrangements: Spouse/significant other  Lives With: Spouse Available Help at Discharge: Family, Available 24 hours/day Type of Home: House Home Layout: Two level, Full bath on main level, Able to live on main level with bedroom/bathroom Alternate Level Stairs-Number of Steps: 14 Home Access: Stairs to enter Entrance Stairs-Rails: Right, Left Entrance Stairs-Number of Steps: 4 Bathroom Shower/Tub: Walk-in shower, Other (comment) (Walk in tub as well) Bathroom Toilet: Handicapped  height Bathroom Accessibility: Yes How Accessible: Accessible via walker Home Care Services: No  Discharge Living Setting Plans for Discharge Living Setting: Patient's home, Lives with (comment) (spouse) Type of Home at Discharge: House Discharge Home Layout: Able to live on main level with bedroom/bathroom Discharge Home Access: Stairs to enter Entrance Stairs-Rails: Right, Left Entrance Stairs-Number of Steps: 4 Discharge Bathroom Shower/Tub: Walk-in shower Discharge Bathroom Toilet: Handicapped height Discharge Bathroom Accessibility: Yes How Accessible: Accessible via walker Does the patient have any problems obtaining your medications?: No  Social/Family/Support Systems Patient Roles: Spouse Anticipated Caregiver: spouse, Maisie Fus Anticipated Industrial/product designer Information: [phone] Ability/Limitations of  Caregiver: none stated Caregiver Availability: 24/7 Discharge Plan Discussed with Primary Caregiver: Yes Is Caregiver In Agreement with Plan?: Yes Does Caregiver/Family have Issues with Lodging/Transportation while Pt is in Rehab?: No   Goals Patient/Family Goal for Rehab: PT/OT supervision to mod I, SLP n/a Expected length of stay: 10-12 days Additional Information: Discharge plan: return to pt's home with spouse providing 24/7 supervision Pt/Family Agrees to Admission and willing to participate: Yes Program Orientation Provided & Reviewed with Pt/Caregiver Including Roles  & Responsibilities: Yes  Barriers to Discharge: Insurance for SNF coverage   Decrease burden of Care through IP rehab admission: n/a   Possible need for SNF placement upon discharge: Not anticipated.  Plan return to pt's home with spouse available 24/7.    Patient Condition: This patient's condition remains as documented in the consult dated 9/27, in which the Rehabilitation Physician determined and documented that the patient's condition is appropriate for intensive rehabilitative care in an inpatient rehabilitation facility. Will admit to inpatient rehab today.  Preadmission Screen Completed By:  Stephania Fragmin, PT, DPT 08/07/2023 3:36 PM ______________________________________________________________________   Discussed status with Dr. Riley Kill on 08/10/23 at  9:57 AM  and received approval for admission today.  Admission Coordinator:  Stephania Fragmin, PT, DPT time 9:57 AM Dorna Bloom 08/10/23

## 2023-08-07 NOTE — Consult Note (Signed)
Physical Medicine and Rehabilitation Consult Reason for Consult: Impaired functional mobility after stroke Referring Physician: Pearlean Brownie   HPI: Priscilla Wilson is a 72 y.o. female with a history of GERD and asthma who presented initially with left-sided sensory loss and impaired coordination on 08/03/2023.  She received TNK initially and was admitted.  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  Neurology feels that the etiology is cryptogenic but may be related to atrial fibrillation.  Patient was placed on aspirin 81 mg and Plavix 75 mg daily for 3 weeks then the plan is to transition to aspirin alone.  Patient was up with therapies yesterday and today is min assist for sit to stand transfers.  She is mod assist for gait 50 feet using a rolling walker with ataxic gait pattern noted on the left.  Prior to her admission she was independent and driving.  She lives in a two-level house with 4 steps to enter.  She can live on the first floor.  Spouse is at home with her and can provide supervision.   Review of Systems  Constitutional: Negative.   HENT: Negative.    Eyes:  Negative for blurred vision and double vision.  Respiratory:  Negative for cough.   Cardiovascular: Negative.   Gastrointestinal:  Negative for heartburn.  Genitourinary:  Negative for dysuria.  Musculoskeletal:  Negative for myalgias.  Skin: Negative.   Neurological:  Positive for sensory change and focal weakness.  Psychiatric/Behavioral:  Negative for depression.    History reviewed. No pertinent past medical history. Past Surgical History:  Procedure Laterality Date   BREAST EXCISIONAL BIOPSY Left    benign   Family History  Problem Relation Age of Onset   Cancer - Ovarian Sister    Social History:  reports that she has never smoked. She has been exposed to tobacco smoke. She has  never used smokeless tobacco. She reports that she does not currently use alcohol. She reports that she does not use drugs. Allergies:  Allergies  Allergen Reactions   Cat Hair Extract Hives, Shortness Of Breath, Itching and Swelling   Shellfish Allergy Hives and Itching   Sulfa Antibiotics Rash   Medications Prior to Admission  Medication Sig Dispense Refill   albuterol (VENTOLIN HFA) 108 (90 Base) MCG/ACT inhaler Inhale 4 puffs into the lungs every 4 (four) hours as needed for wheezing or shortness of breath. 18 g 1   calcium-vitamin D (OSCAL WITH D) 250-125 MG-UNIT tablet Take 2 tablets by mouth daily.     famotidine (PEPCID) 40 MG tablet Take 1 tablet (40 mg total) by mouth at bedtime. 90 tablet 3   Fluticasone-Salmeterol (ADVAIR DISKUS IN) Inhale 1 puff into the lungs in the morning and at bedtime.     meloxicam (MOBIC) 15 MG tablet Take 15 mg by mouth daily.     methimazole (TAPAZOLE) 5 MG tablet Take 5 mg by mouth daily.     Multiple Vitamin (MULTI VITAMIN) TABS Take 1 tablet by mouth daily.     predniSONE (DELTASONE) 10 MG tablet Take 3 tabs (30mg ) twice daily for 3 days, then 2 tabs (20mg ) twice daily for 3 days, then 1 tab (10mg ) twice daily for 3 days, then STOP. 36 tablet 0   simvastatin (ZOCOR) 10 MG tablet Take 10 mg by mouth daily.     venlafaxine XR (EFFEXOR-XR) 75 MG 24 hr  capsule Take 75 mg by mouth daily with breakfast.      Home: Home Living Family/patient expects to be discharged to:: Private residence Living Arrangements: Spouse/significant other Available Help at Discharge: Family, Available 24 hours/day Type of Home: House Home Access: Stairs to enter [employer] of Steps: 4 Entrance Stairs-Rails: Right, Left Home Layout: Two level, Full bath on main level, Able to live on main level with bedroom/bathroom Alternate Level Stairs-Number of Steps: 14 Bathroom Shower/Tub: Walk-in shower, Other (comment) (Walk in tub as well) Bathroom Toilet:  Handicapped height Bathroom Accessibility: Yes Home Equipment: Agricultural consultant (2 wheels), Shower seat, Grab bars - tub/shower  Lives With: Spouse  Functional History: Prior Function Prior Level of Function : Driving, Independent/Modified Independent Mobility Comments: No AD Functional Status:  Mobility: Bed Mobility Overal bed mobility: Needs Assistance Bed Mobility: Supine to Sit Supine to sit: Contact guard General bed mobility comments: Cues for pacing Transfers Overall transfer level: Needs assistance Equipment used: Rolling walker (2 wheels) Transfers: Sit to/from Stand Sit to Stand: Min assist General transfer comment: Cues for hand placement and pacing, improved ability to maintain LUE on RW Ambulation/Gait Ambulation/Gait assistance: Mod assist Gait Distance (Feet): 50 Feet (x2 with seated break) Assistive device: Rolling walker (2 wheels) Gait Pattern/deviations: Step-through pattern, Decreased step length - right, Decreased stride length, Ataxic, Step-to pattern, Knee hyperextension - left, Decreased dorsiflexion - right, Decreased dorsiflexion - left General Gait Details: Pt takes slow, unsteady, ataxic steps with noted L knee hyperextension to prevent buckling and weak LUE but able to maintain grip this date, but difficulty due to wrist weakness and often supinating L wrist around handle due to L pronation musculature weakness. Pt needed repeated cues for L step length/foot positioning, RW management and step sequencing to reduce pressure on her posterior L knee structures. Pt did better with dense cues for sequencing.  "walker, L small step, bend L hip/knee, now tighten L knee while stepping with RLE" etc. Manual assist at front of L knee to prevent buckling/for increased stability. Pt c/o L ankle weakness/concern for foot drop but pt able to maintain neutral L ankle position most of trial. Close chair follow for safety provided by pt spouse, seated break halfway through as LUE  fatigued. Gait velocity: reduced Gait velocity interpretation: <1.31 ft/sec, indicative of household ambulator    ADL: ADL Overall ADL's : Needs assistance/impaired Eating/Feeding: Set up, Sitting Grooming: Wash/dry hands, Wash/dry face, Oral care, Set up, Standing Upper Body Bathing: Contact guard assist, Sitting Lower Body Bathing: Sitting/lateral leans, Sit to/from stand, Moderate assistance Upper Body Dressing : Contact guard assist, Sitting Lower Body Dressing: Minimal assistance, Sit to/from stand, Sitting/lateral leans Toilet Transfer: Ambulation, Rolling walker (2 wheels), Moderate assistance Toilet Transfer Details (indicate cue type and reason): close mod A especially for turns Toileting- Architect and Hygiene: Sit to/from stand, Sitting/lateral lean, Moderate assistance Functional mobility during ADLs: Minimal assistance, Cueing for safety, Cueing for sequencing, Rolling walker (2 wheels) General ADL Comments: Session focus on increasing overall awareness with LUE (FMC exercises provided but did not provide entire handout) and ADLs in standing. Patient needing up to mod A to navigate rolling walker when turning and max cues for placement of LLE. Recommendation remains appopriate, OT will continue to follow.  Cognition: Cognition Overall Cognitive Status: Within Functional Limits for tasks assessed Orientation Level: Oriented X4 Cognition Arousal: Alert Behavior During Therapy: WFL for tasks assessed/performed Overall Cognitive Status: Within Functional Limits for tasks assessed General Comments: Patient motivated and appropriate  Blood pressure (!) 183/80, pulse 71, temperature 99 F (37.2 C), temperature source Oral, resp. rate 18, height 5' (1.524 m), weight 62.5 kg, SpO2 95%. Physical Exam Constitutional:      General: She is not in acute distress. HENT:     Head: Normocephalic.     Mouth/Throat:     Mouth: Mucous membranes are moist.  Eyes:      Extraocular Movements: Extraocular movements intact.     Pupils: Pupils are equal, round, and reactive to light.  Cardiovascular:     Rate and Rhythm: Normal rate.     Pulses: Normal pulses.  Pulmonary:     Effort: Pulmonary effort is normal.  Abdominal:     Palpations: Abdomen is soft.  Musculoskeletal:        General: No swelling or tenderness. Normal range of motion.     Cervical back: Normal range of motion.  Skin:    General: Skin is warm and dry.  Neurological:     Mental Status: She is alert.     Comments: Alert and oriented x 3. Normal insight and awareness. Intact Memory. Normal language and speech. Cranial nerve exam unremarkable except for left perioral sensory loss, left central VII. MMT: RUE and RLE 5/5. LUE and LLE grossly 4+/5. Significant ataxia of both left arm and leg. Sensation in left arm/leg 1+/2. Normal muscle tone.    Psychiatric:        Mood and Affect: Mood normal.        Behavior: Behavior normal.     Results for orders placed or performed during the hospital encounter of 08/03/23 (from the past 24 hour(s))  CBC     Status: Abnormal   Collection Time: 08/06/23 11:02 AM  Result Value Ref Range   WBC 10.8 (H) 4.0 - 10.5 K/uL   RBC 5.11 3.87 - 5.11 MIL/uL   Hemoglobin 13.8 12.0 - 15.0 g/dL   HCT 57.8 46.9 - 62.9 %   MCV 84.9 80.0 - 100.0 fL   MCH 27.0 26.0 - 34.0 pg   MCHC 31.8 30.0 - 36.0 g/dL   RDW 52.8 41.3 - 24.4 %   Platelets 325 150 - 400 K/uL   nRBC 0.0 0.0 - 0.2 %  Basic metabolic panel     Status: Abnormal   Collection Time: 08/06/23 11:02 AM  Result Value Ref Range   Sodium 140 135 - 145 mmol/L   Potassium 3.6 3.5 - 5.1 mmol/L   Chloride 112 (H) 98 - 111 mmol/L   CO2 19 (L) 22 - 32 mmol/L   Glucose, Bld 57 (L) 70 - 99 mg/dL   BUN 24 (H) 8 - 23 mg/dL   Creatinine, Ser 0.10 0.44 - 1.00 mg/dL   Calcium 9.2 8.9 - 27.2 mg/dL   GFR, Estimated >53 >66 mL/min   Anion gap 9 5 - 15  Glucose, capillary     Status: Abnormal   Collection Time:  08/06/23 11:21 AM  Result Value Ref Range   Glucose-Capillary 57 (L) 70 - 99 mg/dL  Glucose, capillary     Status: Abnormal   Collection Time: 08/06/23 11:46 AM  Result Value Ref Range   Glucose-Capillary 122 (H) 70 - 99 mg/dL  Glucose, capillary     Status: Abnormal   Collection Time: 08/06/23  4:09 PM  Result Value Ref Range   Glucose-Capillary 115 (H) 70 - 99 mg/dL  Glucose, capillary     Status: Abnormal   Collection Time: 08/06/23  4:53 PM  Result Value Ref Range   Glucose-Capillary 116 (H) 70 - 99 mg/dL  Glucose, capillary     Status: Abnormal   Collection Time: 08/07/23  6:07 AM  Result Value Ref Range   Glucose-Capillary 118 (H) 70 - 99 mg/dL   Comment 1 Notify RN    Comment 2 Document in Chart    No results found.  Assessment/Plan: Diagnosis: 72 year old female status post acute right thalamic capsular infarct with left limb ataxia and sensory loss Does the need for close, 24 hr/day medical supervision in concert with the patient's rehab needs make it unreasonable for this patient to be served in a less intensive setting? Yes Co-Morbidities requiring supervision/potential complications:  -HTN -Hyperthyroidism -Poststroke sequelae Due to bladder management, bowel management, safety, skin/wound care, disease management, medication administration, pain management, and patient education, does the patient require 24 hr/day rehab nursing? Yes Does the patient require coordinated care of a physician, rehab nurse, therapy disciplines of PT, OT to address physical and functional deficits in the context of the above medical diagnosis(es)? Yes Addressing deficits in the following areas: balance, endurance, locomotion, strength, transferring, bowel/bladder control, bathing, dressing, feeding, grooming, toileting, and psychosocial support Can the patient actively participate in an intensive therapy program of at least 3 hrs of therapy per day at least 5 days per week? Yes The potential  for patient to make measurable gains while on inpatient rehab is excellent Anticipated functional outcomes upon discharge from inpatient rehab are modified independent and supervision  with PT, modified independent and supervision with OT, n/a with SLP. Estimated rehab length of stay to reach the above functional goals is: 9-13 days Anticipated discharge destination: Home Overall Rehab/Functional Prognosis: excellent  POST ACUTE RECOMMENDATIONS: This patient's condition is appropriate for continued rehabilitative care in the following setting: CIR Patient has agreed to participate in recommended program. Yes Note that insurance prior authorization may be required for reimbursement for recommended care.  Comment: Pt was active and independent prior to admit. Husband at home can assist her. Rehab Admissions Coordinator to follow up.        I have personally performed a face to face diagnostic evaluation of this patient. Additionally, I have examined the patient's medical record including any pertinent labs and radiographic images. If the physician assistant has documented in this note, I have reviewed and edited or otherwise concur with the physician assistant's documentation.  Thanks,  Ranelle Oyster, MD 08/07/2023

## 2023-08-08 DIAGNOSIS — E0590 Thyrotoxicosis, unspecified without thyrotoxic crisis or storm: Secondary | ICD-10-CM

## 2023-08-08 DIAGNOSIS — I639 Cerebral infarction, unspecified: Secondary | ICD-10-CM | POA: Diagnosis not present

## 2023-08-08 LAB — GLUCOSE, CAPILLARY
Glucose-Capillary: 106 mg/dL — ABNORMAL HIGH (ref 70–99)
Glucose-Capillary: 64 mg/dL — ABNORMAL LOW (ref 70–99)
Glucose-Capillary: 111 mg/dL — ABNORMAL HIGH (ref 70–99)
Glucose-Capillary: 119 mg/dL — ABNORMAL HIGH (ref 70–99)
Glucose-Capillary: 143 mg/dL — ABNORMAL HIGH (ref 70–99)

## 2023-08-08 MED ORDER — ROSUVASTATIN CALCIUM 20 MG PO TABS
20.0000 mg | ORAL_TABLET | Freq: Every day | ORAL | Status: DC
  Administered 2023-08-08 – 2023-08-10 (×3): 20 mg via ORAL
  Filled 2023-08-08 (×3): qty 1

## 2023-08-08 NOTE — Progress Notes (Addendum)
STROKE TEAM PROGRESS NOTE   BRIEF HPI [name] is a 72 y.o. female with history of GERD, HLD, asthma presenting initially to [facility] with acute onset of left-sided numbness and incoordination at home.  Symptoms fluctuated while at outside hospital with brief improvement followed by return of symptoms prompting code stroke activation and patient was subsequently given TNKase by teleneurology.  Patient was then transported to [facility] hospital for further neurology evaluation.  SIGNIFICANT HOSPITAL EVENTS 9/23: TNK administered 9/23 via teleneurology.  Patient transported to [facility] via [facility] for further neurology evaluation and stroke workup. 9/24: Patient reports disturbed sleep overnight.  Appears anxious on examination this morning.  Patient reports missing her thyroid medication this morning.  Requires Cleviprex gtt for blood pressure control. 9/24: Methimazole initiated with improvement in heart rate, anxiety.  Metoprolol ordered with improvement in blood pressure, Cleviprex off.  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 9/25: Patient stabilized for transfer out of the ICU.  INTERIM HISTORY/SUBJECTIVE No family at the bedside. Pt sitting in bed, still has significant left UE and LE ataxia. Pending CIR  OBJECTIVE CBC    Component Value Date/Time   WBC 10.8 (H) 08/06/2023 1102   RBC 5.11 08/06/2023 1102   HGB 13.8 08/06/2023 1102   HCT 43.4 08/06/2023 1102   PLT 325 08/06/2023 1102   MCV 84.9 08/06/2023 1102   MCH 27.0 08/06/2023 1102   MCHC 31.8 08/06/2023 1102   RDW 13.5 08/06/2023 1102   LYMPHSABS 1.2 08/03/2023 1809   MONOABS 0.4 08/03/2023 1809   EOSABS 0.0 08/03/2023 1809   BASOSABS 0.0 08/03/2023 1809   BMET    Component Value Date/Time   NA 140 08/06/2023 1102   K 3.6 08/06/2023 1102   CL  112 (H) 08/06/2023 1102   CO2 19 (L) 08/06/2023 1102   GLUCOSE 57 (L) 08/06/2023 1102   BUN 24 (H) 08/06/2023 1102   CREATININE 0.85 08/06/2023 1102   CALCIUM 9.2 08/06/2023 1102   GFRNONAA >60 08/06/2023 1102   IMAGING past 24 hours No results found.  Vitals:   08/07/23 2354 08/08/23 0358 08/08/23 0725 08/08/23 1124  BP: (!) 166/80 (!) 158/69 (!) 157/84 (!) 142/68  Pulse: 64 (!) 55 61 (!) 52  Resp: 12 15 17 18   Temp: 98.3 F (36.8 C) 98.3 F (36.8 C) 98.1 F (36.7 C) 97.7 F (36.5 C)  TempSrc: Oral Oral Oral Oral  SpO2: 97% 99% 100% 98%  Weight:      Height:       PHYSICAL EXAM General:  Alert, well-nourished, well-developed Caucasian female patient in no acute distress Psych:  Mood and affect appropriate for situation CV: Giller rate and rhythm on monitor Respiratory:  Regular, unlabored respirations on room air GI: Abdomen soft and nontender  NEURO:  Mental Status: AA&Ox3, patient is able to give clear and coherent history of present illness Speech/Language: speech is without dysarthria or aphasia.    Cranial Nerves:  II: PERRL. Visual fields full.  III, IV, VI: EOMI. Eyelids elevate symmetrically.  V: Sensation is intact to light touch is intact and symmetrical to both sides of face VII: Face is slight asymmetrical on the left VIII: Hearing intact to voice. IX, X: Phonation is normal.  XI: Shoulder shrug 5/5. XII: Tongue protrudes midline Motor: 5/5 strength throughout bilateral upper and lower extremities on confrontational strength testing Tone: is normal  and bulk is normal Sensation: light touch mildly decreased on the left upper and lower extremities.   Coordination: significant ataxia on the LUE and LLE Gait: Deferred  ASSESSMENT/PLAN  Stroke: Acute right thalamic capsular infarct, etiology likely small vessel disease  CT head no acute intracranial hemorrhage or evidence of acute large vessel territory infarct.  Aspect score is 10.  Mild chronic small  vessel disease. CTA head & neck no LVO, hemodynamically significant stenosis, or aneurysm in the head or neck. 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  Associated mild petechial blood products without frank hemorrhagic transformation or significant regional mass effect.  Underlying moderately advanced chronic microvascular ischemic disease. 2D Echo LVEF 70 to 75%.  The left ventricle has hyperdynamic function.  Right ventricular systolic function is hyperdynamic. Will need 30-day event monitor at discharge given hyperthyroidism and palpitation  LDL 88 HgbA1c 6.3 VTE prophylaxis - ambulate as tolerated, okay to start DVT prophylaxis No antithrombotic prior to admission, now on aspirin 81 mg daily and clopidogrel 75 mg daily for 3 weeks then aspirin alone Therapy recommendations: OT and PT recommending CIR Disposition: Pending  Hypertension Home meds: None Stable now Metoprolol 25 mg twice daily initiated due to tachycardia and hypertension As needed labetalol and hydralazine ordered Long term BP goal normotensive  Hyperlipidemia Home meds: Simvastatin 10 mg LDL 88, goal < 70 Now on crestor 20mg  p.o. daily Continue statin at discharge  Other Active Problems Hyperthyroidism Continued home Methimazole TSH slightly elevated, T3 and free T4 unremarkable Tachycardia and anxiety significantly improved on cardiac monitor today with rate in the 80s Stated intermittent heart palpitation at home Recommend 30 day cardiac event monitoring as outpt  Hospital day # 5  Marvel Plan, MD PhD Stroke Neurology 08/08/2023 2:05 PM  To contact Stroke Continuity provider, please refer to [email or web address]. After hours, contact General Neurology

## 2023-08-08 NOTE — Plan of Care (Signed)
  Problem: Education: Goal: Knowledge of disease or condition will improve Outcome: Progressing Goal: Knowledge of secondary prevention will improve (MUST DOCUMENT ALL) Outcome: Progressing Goal: Knowledge of patient specific risk factors will improve Loraine Leriche N/A or DELETE if not current risk factor) Outcome: Progressing   Problem: Ischemic Stroke/TIA Tissue Perfusion: Goal: Complications of ischemic stroke/TIA will be minimized Outcome: Progressing   Problem: Coping: Goal: Will verbalize positive feelings about self Outcome: Progressing Goal: Will identify appropriate support needs Outcome: Progressing   Problem: Health Behavior/Discharge Planning: Goal: Ability to manage health-related needs will improve Outcome: Progressing Goal: Goals will be collaboratively established with patient/family Outcome: Progressing   Problem: Self-Care: Goal: Ability to participate in self-care as condition permits will improve Outcome: Progressing Goal: Verbalization of feelings and concerns over difficulty with self-care will improve Outcome: Progressing Goal: Ability to communicate needs accurately will improve Outcome: Progressing   Problem: Nutrition: Goal: Risk of aspiration will decrease Outcome: Progressing Goal: Dietary intake will improve Outcome: Progressing   Problem: Education: Goal: Ability to describe self-care measures that may prevent or decrease complications (Diabetes Survival Skills Education) will improve Outcome: Progressing Goal: Individualized Educational Video(s) Outcome: Progressing   Problem: Activity: Goal: Risk for activity intolerance will decrease Outcome: Progressing   Problem: Elimination: Goal: Will not experience complications related to bowel motility Outcome: Progressing Goal: Will not experience complications related to urinary retention Outcome: Progressing

## 2023-08-09 DIAGNOSIS — I639 Cerebral infarction, unspecified: Secondary | ICD-10-CM | POA: Diagnosis not present

## 2023-08-09 LAB — GLUCOSE, CAPILLARY
Glucose-Capillary: 115 mg/dL — ABNORMAL HIGH (ref 70–99)
Glucose-Capillary: 89 mg/dL (ref 70–99)
Glucose-Capillary: 120 mg/dL — ABNORMAL HIGH (ref 70–99)
Glucose-Capillary: 121 mg/dL — ABNORMAL HIGH (ref 70–99)

## 2023-08-09 NOTE — Progress Notes (Addendum)
STROKE TEAM PROGRESS NOTE   BRIEF HPI [name] is a 72 y.o. female with history of GERD, HLD, asthma presenting initially to [facility] with acute onset of left-sided numbness and incoordination at home.  Symptoms fluctuated while at outside hospital with brief improvement followed by return of symptoms prompting code stroke activation and patient was subsequently given TNKase by teleneurology.  Patient was then transported to [facility] hospital for further neurology evaluation.  SIGNIFICANT HOSPITAL EVENTS 9/23: TNK administered 9/23 via teleneurology.  Patient transported to [facility] via [facility] for further neurology evaluation and stroke workup. 9/24: Patient reports disturbed sleep overnight.  Appears anxious on examination this morning.  Patient reports missing her thyroid medication this morning.  Requires Cleviprex gtt for blood pressure control. 9/24: Methimazole initiated with improvement in heart rate, anxiety.  Metoprolol ordered with improvement in blood pressure, Cleviprex off.  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 9/25: Patient stabilized for transfer out of the ICU.  INTERIM HISTORY/SUBJECTIVE Patient is seen in her room with no family at the bedside.  She is awaiting discharge to CIR.  Slight pseudo chorea still seen on left arm and leg, but improved from a few days prior.  OBJECTIVE CBC    Component Value Date/Time   WBC 10.8 (H) 08/06/2023 1102   RBC 5.11 08/06/2023 1102   HGB 13.8 08/06/2023 1102   HCT 43.4 08/06/2023 1102   PLT 325 08/06/2023 1102   MCV 84.9 08/06/2023 1102   MCH 27.0 08/06/2023 1102   MCHC 31.8 08/06/2023 1102   RDW 13.5 08/06/2023 1102   LYMPHSABS 1.2 08/03/2023 1809   MONOABS 0.4 08/03/2023 1809   EOSABS 0.0 08/03/2023 1809   BASOSABS 0.0 08/03/2023 1809   BMET     Component Value Date/Time   NA 140 08/06/2023 1102   K 3.6 08/06/2023 1102   CL 112 (H) 08/06/2023 1102   CO2 19 (L) 08/06/2023 1102   GLUCOSE 57 (L) 08/06/2023 1102   BUN 24 (H) 08/06/2023 1102   CREATININE 0.85 08/06/2023 1102   CALCIUM 9.2 08/06/2023 1102   GFRNONAA >60 08/06/2023 1102   IMAGING past 24 hours No results found.  Vitals:   08/08/23 2318 08/09/23 0324 08/09/23 0803 08/09/23 1023  BP: (!) 151/72 (!) 155/74 (!) 152/69 (!) 151/73  Pulse: (!) 58 (!) 58 62 62  Resp: 18 18 15 16   Temp: 98 F (36.7 C) 98.1 F (36.7 C) 98 F (36.7 C) 97.8 F (36.6 C)  TempSrc: Oral Oral Oral Oral  SpO2: 95% 94% 100% 100%  Weight:      Height:       PHYSICAL EXAM General:  Alert, well-nourished, well-developed Caucasian female patient in no acute distress Psych:  Mood and affect appropriate for situation CV: Giller rate and rhythm on monitor Respiratory:  Regular, unlabored respirations on room air GI: Abdomen soft and nontender  NEURO:  Mental Status: AA&Ox3, patient is able to give clear and coherent history of present illness Speech/Language: speech is without dysarthria or aphasia.    Cranial Nerves:  II: PERRL. Visual fields full.  III, IV, VI: EOMI. Eyelids elevate symmetrically.  V: Sensation is intact to light touch is intact and diminished in V2 and V3 on the left VII: Face is slight asymmetrical on the left VIII: Hearing intact to voice. IX, X: Phonation is normal.  XI: Shoulder shrug 5/5.  XII: Tongue protrudes midline Motor: 5/5 strength throughout bilateral upper and lower extremities on confrontational strength testing Tone: is normal and bulk is normal Sensation: light touch mildly decreased on the left upper and lower extremities.   Coordination: significant ataxia on the LUE and LLE with pseudo chorea, much improved from a few days prior Gait: Deferred  ASSESSMENT/PLAN  Stroke: Acute right thalamic capsular infarct, etiology likely small vessel  disease  CT head no acute intracranial hemorrhage or evidence of acute large vessel territory infarct.  Aspect score is 10.  Mild chronic small vessel disease. CTA head & neck no LVO, hemodynamically significant stenosis, or aneurysm in the head or neck. 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  Associated mild petechial blood products without frank hemorrhagic transformation or significant regional mass effect.  Underlying moderately advanced chronic microvascular ischemic disease. 2D Echo LVEF 70 to 75%.  The left ventricle has hyperdynamic function.  Right ventricular systolic function is hyperdynamic. Will need 30-day event monitor at discharge given hyperthyroidism and palpitation  LDL 88 HgbA1c 6.3 VTE prophylaxis - ambulate as tolerated, okay to start DVT prophylaxis No antithrombotic prior to admission, now on aspirin 81 mg daily and clopidogrel 75 mg daily for 3 weeks then aspirin alone Therapy recommendations: OT and PT recommending CIR Disposition: Pending  Hypertension Home meds: None Stable now Metoprolol 25 mg twice daily initiated due to tachycardia and hypertension As needed labetalol and hydralazine ordered Long term BP goal normotensive  Hyperlipidemia Home meds: Simvastatin 10 mg LDL 88, goal < 70 Now on crestor 20mg  p.o. daily Continue statin at discharge  Other Active Problems Hyperthyroidism Continued home Methimazole TSH slightly elevated, T3 and free T4 unremarkable Tachycardia and anxiety significantly improved on cardiac monitor today with rate in the 80s Stated intermittent heart palpitation at home Recommend 30 day cardiac event monitoring as outpt  Hospital day # 6  Patient seen by NP and then by MD, MD to edit note as needed. Cortney E Ernestina Columbia , MSN, AGACNP-BC [facility] See Amion for schedule and pager information 08/09/2023 11:55 AM  ATTENDING NOTE: I reviewed above note and agree with the  assessment and plan. Pt was seen and examined.   No acute event overnight. Pt neuro stable unchanged. On DAPT and statin. Recommend outpt 30 day monitoring. Pending CIR.   For detailed assessment and plan, please refer to above/below as I have made changes wherever appropriate.   Marvel Plan, MD PhD Stroke Neurology 08/09/2023 4:42 PM      To contact Stroke Continuity provider, please refer to [email or web address]. After hours, contact General Neurology

## 2023-08-10 ENCOUNTER — Other Ambulatory Visit: Payer: Self-pay

## 2023-08-10 ENCOUNTER — Encounter (HOSPITAL_COMMUNITY): Payer: Self-pay | Admitting: Physical Medicine & Rehabilitation

## 2023-08-10 ENCOUNTER — Inpatient Hospital Stay (HOSPITAL_COMMUNITY)
Admission: RE | Admit: 2023-08-10 | Discharge: 2023-08-21 | DRG: 057 | Disposition: A | Discharge status: Home Health | Payer: PPO | Source: Intra-hospital | Attending: Physical Medicine & Rehabilitation | Admitting: Physical Medicine & Rehabilitation

## 2023-08-10 DIAGNOSIS — K219 Gastro-esophageal reflux disease without esophagitis: Secondary | ICD-10-CM | POA: Diagnosis not present

## 2023-08-10 DIAGNOSIS — Z882 Allergy status to sulfonamides status: Secondary | ICD-10-CM

## 2023-08-10 DIAGNOSIS — Z79890 Hormone replacement therapy (postmenopausal): Secondary | ICD-10-CM

## 2023-08-10 DIAGNOSIS — I69354 Hemiplegia and hemiparesis following cerebral infarction affecting left non-dominant side: Principal | ICD-10-CM

## 2023-08-10 DIAGNOSIS — N179 Acute kidney failure, unspecified: Secondary | ICD-10-CM | POA: Diagnosis not present

## 2023-08-10 DIAGNOSIS — Z9109 Other allergy status, other than to drugs and biological substances: Secondary | ICD-10-CM | POA: Diagnosis not present

## 2023-08-10 DIAGNOSIS — Z7951 Long term (current) use of inhaled steroids: Secondary | ICD-10-CM | POA: Diagnosis not present

## 2023-08-10 DIAGNOSIS — R7303 Prediabetes: Secondary | ICD-10-CM | POA: Diagnosis present

## 2023-08-10 DIAGNOSIS — J45909 Unspecified asthma, uncomplicated: Secondary | ICD-10-CM | POA: Diagnosis not present

## 2023-08-10 DIAGNOSIS — E119 Type 2 diabetes mellitus without complications: Secondary | ICD-10-CM

## 2023-08-10 DIAGNOSIS — Z79899 Other long term (current) drug therapy: Secondary | ICD-10-CM | POA: Diagnosis not present

## 2023-08-10 DIAGNOSIS — I69398 Other sequelae of cerebral infarction: Secondary | ICD-10-CM

## 2023-08-10 DIAGNOSIS — I10 Essential (primary) hypertension: Secondary | ICD-10-CM

## 2023-08-10 DIAGNOSIS — R270 Ataxia, unspecified: Secondary | ICD-10-CM

## 2023-08-10 DIAGNOSIS — I69393 Ataxia following cerebral infarction: Secondary | ICD-10-CM | POA: Diagnosis not present

## 2023-08-10 DIAGNOSIS — Z91013 Allergy to seafood: Secondary | ICD-10-CM

## 2023-08-10 DIAGNOSIS — E785 Hyperlipidemia, unspecified: Secondary | ICD-10-CM | POA: Diagnosis present

## 2023-08-10 DIAGNOSIS — E0590 Thyrotoxicosis, unspecified without thyrotoxic crisis or storm: Secondary | ICD-10-CM | POA: Diagnosis not present

## 2023-08-10 DIAGNOSIS — Z794 Long term (current) use of insulin: Secondary | ICD-10-CM

## 2023-08-10 DIAGNOSIS — Z791 Long term (current) use of non-steroidal anti-inflammatories (NSAID): Secondary | ICD-10-CM | POA: Diagnosis not present

## 2023-08-10 DIAGNOSIS — I639 Cerebral infarction, unspecified: Secondary | ICD-10-CM | POA: Diagnosis not present

## 2023-08-10 LAB — BASIC METABOLIC PANEL
Anion gap: 11 (ref 5–15)
BUN: 21 mg/dL (ref 8–23)
CO2: 20 mmol/L — ABNORMAL LOW (ref 22–32)
Calcium: 9 mg/dL (ref 8.9–10.3)
Chloride: 107 mmol/L (ref 98–111)
Creatinine, Ser: 0.93 mg/dL (ref 0.44–1.00)
GFR, Estimated: >60 mL/min (ref >60)
Glucose, Bld: 113 mg/dL — ABNORMAL HIGH (ref 70–99)
Potassium: 4.2 mmol/L (ref 3.5–5.1)
Sodium: 138 mmol/L (ref 135–145)

## 2023-08-10 LAB — CBC
HCT: 43.3 % (ref 36.0–46.0)
Hemoglobin: 13.8 g/dL (ref 12.0–15.0)
MCH: 27.9 pg (ref 26.0–34.0)
MCHC: 31.9 g/dL (ref 30.0–36.0)
MCV: 87.5 fL (ref 80.0–100.0)
Platelets: 246 10*3/uL (ref 150–400)
RBC: 4.95 MIL/uL (ref 3.87–5.11)
RDW: 13.2 % (ref 11.5–15.5)
WBC: 8.4 10*3/uL (ref 4.0–10.5)
nRBC: 0 % (ref 0.0–0.2)

## 2023-08-10 LAB — GLUCOSE, CAPILLARY
Glucose-Capillary: 111 mg/dL — ABNORMAL HIGH (ref 70–99)
Glucose-Capillary: 124 mg/dL — ABNORMAL HIGH (ref 70–99)
Glucose-Capillary: 100 mg/dL — ABNORMAL HIGH (ref 70–99)

## 2023-08-10 MED ORDER — FAMOTIDINE 20 MG PO TABS
40.0000 mg | ORAL_TABLET | Freq: Every day | ORAL | Status: DC
  Administered 2023-08-10 – 2023-08-20 (×11): 40 mg via ORAL
  Filled 2023-08-10 (×11): qty 2

## 2023-08-10 MED ORDER — ONDANSETRON HCL 4 MG PO TABS: 4.0000 mg | ORAL_TABLET | Freq: Four times a day (QID) | ORAL | Status: DC | PRN

## 2023-08-10 MED ORDER — ASPIRIN 81 MG PO TBEC
81.0000 mg | DELAYED_RELEASE_TABLET | Freq: Every day | ORAL | Status: DC
  Administered 2023-08-11 – 2023-08-21 (×11): 81 mg via ORAL
  Filled 2023-08-10 (×11): qty 1

## 2023-08-10 MED ORDER — SENNOSIDES-DOCUSATE SODIUM 8.6-50 MG PO TABS: 1.0000 | ORAL_TABLET | Freq: Every evening | ORAL | Status: DC | PRN

## 2023-08-10 MED ORDER — ACETAMINOPHEN 325 MG PO TABS
325.0000 mg | ORAL_TABLET | ORAL | Status: DC | PRN
  Administered 2023-08-13 – 2023-08-18 (×2): 650 mg via ORAL
  Filled 2023-08-10 (×2): qty 2

## 2023-08-10 MED ORDER — FLEET ENEMA RE ENEM: 1.0000 | ENEMA | Freq: Once | RECTAL | Status: DC | PRN

## 2023-08-10 MED ORDER — LABETALOL HCL 5 MG/ML IV SOLN: 10.0000 mg | INTRAVENOUS | Status: DC | PRN

## 2023-08-10 MED ORDER — METOPROLOL TARTRATE 12.5 MG HALF TABLET
25.0000 mg | ORAL_TABLET | Freq: Two times a day (BID) | ORAL | Status: DC
  Administered 2023-08-10 – 2023-08-21 (×22): 25 mg via ORAL
  Filled 2023-08-10 (×22): qty 2

## 2023-08-10 MED ORDER — CLOPIDOGREL BISULFATE 75 MG PO TABS
75.0000 mg | ORAL_TABLET | Freq: Every day | ORAL | Status: DC
  Administered 2023-08-11 – 2023-08-21 (×11): 75 mg via ORAL
  Filled 2023-08-10 (×11): qty 1

## 2023-08-10 MED ORDER — ONDANSETRON HCL 4 MG/2ML IJ SOLN: 4.0000 mg | Freq: Four times a day (QID) | INTRAMUSCULAR | Status: DC | PRN

## 2023-08-10 MED ORDER — ASPIRIN 81 MG PO TBEC: 81.0000 mg | DELAYED_RELEASE_TABLET | Freq: Every day | ORAL | 12 refills | Status: DC

## 2023-08-10 MED ORDER — VENLAFAXINE HCL ER 75 MG PO CP24
75.0000 mg | ORAL_CAPSULE | Freq: Every day | ORAL | Status: DC
  Administered 2023-08-11 – 2023-08-21 (×11): 75 mg via ORAL
  Filled 2023-08-10 (×11): qty 1

## 2023-08-10 MED ORDER — METHIMAZOLE 5 MG PO TABS
5.0000 mg | ORAL_TABLET | Freq: Every day | ORAL | Status: DC
  Administered 2023-08-11 – 2023-08-21 (×11): 5 mg via ORAL
  Filled 2023-08-10 (×12): qty 1

## 2023-08-10 MED ORDER — METOPROLOL TARTRATE 25 MG PO TABS: 25.0000 mg | ORAL_TABLET | Freq: Two times a day (BID) | ORAL | Status: DC

## 2023-08-10 MED ORDER — ORAL CARE MOUTH RINSE: 15.0000 mL | OROMUCOSAL | Status: DC | PRN

## 2023-08-10 MED ORDER — ROSUVASTATIN CALCIUM 20 MG PO TABS: 20.0000 mg | ORAL_TABLET | Freq: Every day | ORAL | Status: DC

## 2023-08-10 MED ORDER — METHOCARBAMOL 500 MG PO TABS: 500.0000 mg | ORAL_TABLET | Freq: Four times a day (QID) | ORAL | Status: DC | PRN

## 2023-08-10 MED ORDER — CLOPIDOGREL BISULFATE 75 MG PO TABS: 75.0000 mg | ORAL_TABLET | Freq: Every day | ORAL | Status: DC

## 2023-08-10 MED ORDER — ROSUVASTATIN CALCIUM 20 MG PO TABS
20.0000 mg | ORAL_TABLET | Freq: Every day | ORAL | Status: DC
  Administered 2023-08-11 – 2023-08-21 (×11): 20 mg via ORAL
  Filled 2023-08-10 (×11): qty 1

## 2023-08-10 MED ORDER — HYDRALAZINE HCL 20 MG/ML IJ SOLN: 10.0000 mg | INTRAMUSCULAR | Status: DC | PRN

## 2023-08-10 MED ORDER — BISACODYL 10 MG RE SUPP: 10.0000 mg | Freq: Every day | RECTAL | Status: DC | PRN

## 2023-08-10 MED ORDER — MELATONIN 5 MG PO TABS
5.0000 mg | ORAL_TABLET | Freq: Every day | ORAL | Status: DC
  Administered 2023-08-10 – 2023-08-20 (×11): 5 mg via ORAL
  Filled 2023-08-10 (×11): qty 1

## 2023-08-10 MED ORDER — ACETAMINOPHEN 325 MG PO TABS: 650.0000 mg | ORAL_TABLET | ORAL | Status: DC | PRN

## 2023-08-10 MED ORDER — POLYETHYLENE GLYCOL 3350 17 G PO PACK: 17.0000 g | PACK | Freq: Every day | ORAL | Status: DC | PRN

## 2023-08-10 MED ORDER — IPRATROPIUM-ALBUTEROL 0.5-2.5 (3) MG/3ML IN SOLN: 3.0000 mL | RESPIRATORY_TRACT | Status: DC | PRN

## 2023-08-10 MED ORDER — ALUM & MAG HYDROXIDE-SIMETH 200-200-20 MG/5ML PO SUSP: 30.0000 mL | ORAL | Status: DC | PRN

## 2023-08-10 MED ORDER — ENOXAPARIN SODIUM 40 MG/0.4ML IJ SOSY
40.0000 mg | PREFILLED_SYRINGE | INTRAMUSCULAR | Status: DC
  Administered 2023-08-10 – 2023-08-13 (×4): 40 mg via SUBCUTANEOUS
  Filled 2023-08-10 (×4): qty 0.4

## 2023-08-10 MED ORDER — GUAIFENESIN-DM 100-10 MG/5ML PO SYRP: 10.0000 mL | ORAL_SOLUTION | Freq: Four times a day (QID) | ORAL | Status: DC | PRN

## 2023-08-10 MED ORDER — ACETAMINOPHEN 650 MG RE SUPP: 650.0000 mg | RECTAL | 0 refills | Status: DC | PRN

## 2023-08-10 NOTE — Progress Notes (Signed)
Occupational Therapy Treatment Patient Details Name: Priscilla Wilson MRN: [id number] DOB: 1951/03/25 Today's Date: 08/10/2023   History of present illness Pt is 72 yo female who presents on 08/03/23 with ataxia and L sided numbness who received TNK. 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 PMH: GERD, asthma, dyslipidemia   OT comments  Patient continues to make steady progress towards goals in skilled OT session. Patient's session encompassed  increasing overall awareness with functional mobility and ADLs in standing. Patient continues to need up to mod A to navigate rolling walker when turning and max cues for placement of LLE. Increased cues needed for awareness of LUE with bed mobility and functional mobility. Recommendation remains appopriate, OT will continue to follow.       If plan is discharge home, recommend the following:  A lot of help with walking and/or transfers;Assistance with cooking/housework;Assist for transportation;Help with stairs or ramp for entrance;A little help with bathing/dressing/bathroom   Equipment Recommendations  Other (comment) (defer to next venue)    Recommendations for Other Services      Precautions / Restrictions Precautions Precautions: Fall Precaution Comments: BP goal <180/105 Restrictions Weight Bearing Restrictions: No       Mobility Bed Mobility Overal bed mobility: Needs Assistance Bed Mobility: Supine to Sit     Supine to sit: Contact guard     General bed mobility comments: Cues for pacing and attention to LUE in relation to body    Transfers Overall transfer level: Needs assistance Equipment used: Rolling walker (2 wheels) Transfers: Sit to/from Stand Sit to Stand: Min assist           General transfer comment: Cues for hand placement and pacing, improved ability to maintain LUE on RW     Balance Overall balance assessment: Needs assistance Sitting-balance support: No upper extremity supported, Feet  supported Sitting balance-Leahy Scale: Fair Sitting balance - Comments: Can sit statically with CGA for safety   Standing balance support: Bilateral upper extremity supported, During functional activity, Reliant on assistive device for balance Standing balance-Leahy Scale: Poor Standing balance comment: Reliant on RW and modA                           ADL either performed or assessed with clinical judgement   ADL Overall ADL's : Needs assistance/impaired Eating/Feeding: Set up;Sitting   Grooming: Wash/dry hands;Wash/dry face;Oral care;Set up;Standing;Applying deodorant   Upper Body Bathing: Contact guard assist;Standing       Upper Body Dressing : Minimal assistance;Standing Upper Body Dressing Details (indicate cue type and reason): min A to doff and don gown in standing     Toilet Transfer: Ambulation;Rolling walker (2 wheels);Moderate assistance Toilet Transfer Details (indicate cue type and reason): close mod A especially for turns         Functional mobility during ADLs: Minimal assistance;Cueing for safety;Cueing for sequencing;Rolling walker (2 wheels) General ADL Comments: Session focus on increasing overall awareness with functional mobility and ADLs in standing. Patient continues to need up to mod A to navigate rolling walker when turning and max cues for placement of LLE. Recommendation remains appopriate, OT will continue to follow.    Extremity/Trunk Assessment              Vision       Perception     Praxis      Cognition Arousal: Alert Behavior During Therapy: WFL for tasks assessed/performed Overall Cognitive Status: Within Functional Limits for tasks assessed  General Comments: Patient motivated and appropriate        Exercises Exercises: Other exercises Other Exercises Other Exercises: tall marches x5 on LLE and RLE    Shoulder Instructions       General Comments VSS     Pertinent Vitals/ Pain          Home Living                                          Prior Functioning/Environment              Frequency  Min 1X/week        Progress Toward Goals  OT Goals(current goals can now be found in the care plan section)  Progress towards OT goals: Progressing toward goals  Acute Rehab OT Goals Patient Stated Goal: to go to rehab OT Goal Formulation: With patient Time For Goal Achievement: 08/19/23 Potential to Achieve Goals: Good  Plan      Co-evaluation                 AM-PAC OT "6 Clicks" Daily Activity     Outcome Measure   Help from another person eating meals?: A Little Help from another person taking care of personal grooming?: A Little Help from another person toileting, which includes using toliet, bedpan, or urinal?: A Lot Help from another person bathing (including washing, rinsing, drying)?: A Little Help from another person to put on and taking off regular upper body clothing?: A Little Help from another person to put on and taking off regular lower body clothing?: A Lot 6 Click Score: 16    End of Session Equipment Utilized During Treatment: Gait belt;Rolling walker (2 wheels)  OT Visit Diagnosis: Unsteadiness on feet (R26.81);Ataxia, unspecified (R27.0)   Activity Tolerance Patient tolerated treatment well   Patient Left in chair;with call bell/phone within reach;with chair alarm set   Nurse Communication Mobility status        Time: 8416-6063 OT Time Calculation (min): 25 min  Charges: OT General Charges $OT Visit: 1 Visit OT Treatments $Self Care/Home Management : 23-37 mins  Pollyann Glen E. Rhylan Gross, OTR/L Acute Rehabilitation Services [phone]   Cherlyn Cushing 08/10/2023, 8:45 AM

## 2023-08-10 NOTE — H&P (Signed)
Physical Medicine and Rehabilitation Admission H&P     CC: Functional deficits secondary to acute right thalamic capsular infarct, etiology likely small vessel disease    HPI: Priscilla Wilson is a 72 year old female who presented to the [facility] on 08/03/2023 with sudden onset of left-sided numbness and weakness. She was emergently evaluated by teleneurology and she was given TNKase. PMH significant for hyperthyroidism on methimazole, asthma, hyperlipidemia, GERD. Methimazole initiated with improvement in heart rate, anxiety. Metoprolol ordered with improvement in blood pressure, Cleviprex off. 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 2D echo LVEF 70-75%. Hgb A1C 6.3%. Now on aspirin 81 mg daily and clopidogrel 75 mg daily for 3 weeks then aspirin alone. Recommend 30 day cardiac event monitoring. Patient continues to need up to mod A to navigate rolling walker when turning and max cues for placement of LLE. Increased cues needed for awareness of LUE with bed mobility and functional mobility. The patient requires inpatient medicine and rehabilitation evaluations and services for ongoing dysfunction secondary to right thalamic capsular infarct.   Review of Systems  Constitutional:  Negative for chills and fever.  HENT:  Negative for congestion and sore throat.   Respiratory:  Negative for cough and shortness of breath.   Cardiovascular:  Negative for chest pain and leg swelling.  Genitourinary:  Negative for dysuria and urgency.  Neurological:  Negative for dizziness and headaches.  Psychiatric/Behavioral:  Negative for depression. The patient is not nervous/anxious.     History reviewed. No pertinent past medical history.          Past Surgical History:  Procedure Laterality Date   BREAST EXCISIONAL BIOPSY Left      benign              Family History  Problem Relation Age of Onset   Cancer - Ovarian Sister          Social History:  reports that she has never smoked. She has been exposed to tobacco smoke. She has never used smokeless tobacco. She reports that she does not currently use alcohol. She reports that she does not use drugs. Allergies:  Allergies      Allergies  Allergen Reactions   Cat Hair Extract Hives, Shortness Of Breath, Itching and Swelling   Shellfish Allergy Hives and Itching   Sulfa Antibiotics Rash            Medications Prior to Admission  Medication Sig Dispense Refill   albuterol (VENTOLIN HFA) 108 (90 Base) MCG/ACT inhaler Inhale 4 puffs into the lungs every 4 (four) hours as needed for wheezing or shortness of breath. 18 g 1   calcium-vitamin D (OSCAL WITH D) 250-125 MG-UNIT tablet Take 2 tablets by mouth daily.       famotidine (PEPCID) 40 MG tablet Take 1 tablet (40 mg total) by mouth at bedtime. 90 tablet 3   Fluticasone-Salmeterol (ADVAIR DISKUS IN) Inhale 1 puff into the lungs in the morning and at bedtime.       meloxicam (MOBIC) 15 MG tablet Take 15 mg by mouth daily.       methimazole (TAPAZOLE) 5 MG tablet Take 5 mg by mouth daily.       Multiple Vitamin (MULTI VITAMIN) TABS Take 1 tablet by mouth daily.       predniSONE (DELTASONE) 10 MG tablet Take 3 tabs (30mg ) twice daily for 3 days,  then 2 tabs (20mg ) twice daily for 3 days, then 1 tab (10mg ) twice daily for 3 days, then STOP. 36 tablet 0   simvastatin (ZOCOR) 10 MG tablet Take 10 mg by mouth daily.       venlafaxine XR (EFFEXOR-XR) 75 MG 24 hr capsule Take 75 mg by mouth daily with breakfast.                  Home: Home Living Family/patient expects to be discharged to:: Private residence Living Arrangements: Spouse/significant other Available Help at Discharge: Family, Available 24 hours/day Type of Home: House Home Access: Stairs to enter [employer] of Steps: 4 Entrance Stairs-Rails: Right,  Left Home Layout: Two level, Full bath on main level, Able to live on main level with bedroom/bathroom Alternate Level Stairs-Number of Steps: 14 Bathroom Shower/Tub: Walk-in shower, Other (comment) (Walk in tub as well) Bathroom Toilet: Handicapped height Bathroom Accessibility: Yes Home Equipment: Agricultural consultant (2 wheels), Shower seat, Grab bars - tub/shower  Lives With: Spouse   Functional History: Prior Function Prior Level of Function : Driving, Independent/Modified Independent Mobility Comments: No AD   Functional Status:  Mobility: Bed Mobility Overal bed mobility: Needs Assistance Bed Mobility: Supine to Sit Supine to sit: Contact guard Sit to supine: Contact guard assist General bed mobility comments: Cues for pacing and attention to LUE in relation to body Transfers Overall transfer level: Needs assistance Equipment used: Rolling walker (2 wheels) Transfers: Sit to/from Stand Sit to Stand: Min assist General transfer comment: Cues for hand placement and pacing, improved ability to maintain LUE on RW Ambulation/Gait Ambulation/Gait assistance: Mod assist, Min assist Gait Distance (Feet):  (3ft, 62ft, 58ft) Assistive device: Rolling walker (2 wheels) Gait Pattern/deviations: Step-to pattern, Step-through pattern, Ataxic, Knee hyperextension - left General Gait Details: ataxic steps with LLE with varying step length. cues for appropriate foot placement including step length, base of support, and position in relation to rolling walker. intermittent decreased dorsiflexion noted on the left with cues for heel-toe gait pattern. several standing rest breaks required. Gait velocity: decreased Gait velocity interpretation: <1.31 ft/sec, indicative of household ambulator   ADL: ADL Overall ADL's : Needs assistance/impaired Eating/Feeding: Set up, Sitting Grooming: Wash/dry hands, Wash/dry face, Oral care, Set up, Standing, Applying deodorant Upper Body Bathing: Contact  guard assist, Standing Lower Body Bathing: Sitting/lateral leans, Sit to/from stand, Moderate assistance Upper Body Dressing : Minimal assistance, Standing Upper Body Dressing Details (indicate cue type and reason): min A to doff and don gown in standing Lower Body Dressing: Minimal assistance, Sit to/from stand, Sitting/lateral leans Toilet Transfer: Ambulation, Rolling walker (2 wheels), Moderate assistance Toilet Transfer Details (indicate cue type and reason): close mod A especially for turns Toileting- Architect and Hygiene: Sit to/from stand, Sitting/lateral lean, Moderate assistance Functional mobility during ADLs: Minimal assistance, Cueing for safety, Cueing for sequencing, Rolling walker (2 wheels) General ADL Comments: Session focus on increasing overall awareness with functional mobility and ADLs in standing. Patient continues to need up to mod A to navigate rolling walker when turning and max cues for placement of LLE. Recommendation remains appopriate, OT will continue to follow.   Cognition: Cognition Overall Cognitive Status: Within Functional Limits for tasks assessed Orientation Level: Oriented X4 Cognition Arousal: Alert Behavior During Therapy: WFL for tasks assessed/performed Overall Cognitive Status: Within Functional Limits for tasks assessed General Comments: Patient motivated and appropriate   Physical Exam: Blood pressure (!) 151/78, pulse 69, temperature 97.6 F (36.4 C), temperature source Axillary, resp. rate 18,  height 5' (1.524 m), weight 62.5 kg, SpO2 100%. Physical Exam Constitutional:      General: She is not in acute distress. HENT:     Head: Normocephalic.     Right Ear: External ear normal.     Left Ear: External ear normal.     Nose: Nose normal.     Mouth/Throat:     Pharynx: Oropharynx is clear.  Eyes:     Extraocular Movements: Extraocular movements intact.     Pupils: Pupils are equal, round, and reactive to light.   Cardiovascular:     Rate and Rhythm: Normal rate and regular rhythm.     Heart sounds: No murmur heard.    No gallop.  Pulmonary:     Effort: Pulmonary effort is normal. No respiratory distress.     Breath sounds: Normal breath sounds. No wheezing.  Abdominal:     General: Bowel sounds are normal. There is no distension.     Palpations: Abdomen is soft.     Tenderness: There is no abdominal tenderness.  Musculoskeletal:        General: No swelling or tenderness.     Cervical back: Normal range of motion and neck supple.  Skin:    General: Skin is warm and dry.  Neurological:     Mental Status: She is alert and oriented to person, place, and time.     Comments: Alert and oriented x 3. Normal insight and awareness. Intact Memory. Normal language and speech. Cranial nerve exam unremarkable except for left perioral sensory loss. MMT: RUE 5/5. LUE 4+/5. RLE 5/5. LLE 4 to 4+/5. Significant limb ataxia LUE and LLE with FTN and HTS. Sensation 1 to 1+/2 left arm and leg. DTRs 1+. No abnl resting tone.Marland Kitchen    Psychiatric:        Mood and Affect: Mood normal.        Behavior: Behavior normal.        Lab Results Last 48 Hours        Results for orders placed or performed during the hospital encounter of 08/03/23 (from the past 48 hour(s))  Glucose, capillary     Status: Abnormal    Collection Time: 08/08/23 11:25 AM  Result Value Ref Range    Glucose-Capillary 64 (L) 70 - 99 mg/dL      Comment: Glucose reference range applies only to samples taken after fasting for at least 8 hours.    Comment 1 Notify RN    Glucose, capillary     Status: Abnormal    Collection Time: 08/08/23 12:05 PM  Result Value Ref Range    Glucose-Capillary 111 (H) 70 - 99 mg/dL      Comment: Glucose reference range applies only to samples taken after fasting for at least 8 hours.    Comment 1 Notify RN    Glucose, capillary     Status: Abnormal    Collection Time: 08/08/23  3:49 PM  Result Value Ref Range     Glucose-Capillary 119 (H) 70 - 99 mg/dL      Comment: Glucose reference range applies only to samples taken after fasting for at least 8 hours.  Glucose, capillary     Status: Abnormal    Collection Time: 08/08/23  9:02 PM  Result Value Ref Range    Glucose-Capillary 143 (H) 70 - 99 mg/dL      Comment: Glucose reference range applies only to samples taken after fasting for at least 8 hours.    Comment  1 Notify RN      Comment 2 Document in Chart    Glucose, capillary     Status: Abnormal    Collection Time: 08/09/23  6:16 AM  Result Value Ref Range    Glucose-Capillary 115 (H) 70 - 99 mg/dL      Comment: Glucose reference range applies only to samples taken after fasting for at least 8 hours.    Comment 1 Notify RN      Comment 2 Document in Chart    Glucose, capillary     Status: None    Collection Time: 08/09/23 10:24 AM  Result Value Ref Range    Glucose-Capillary 89 70 - 99 mg/dL      Comment: Glucose reference range applies only to samples taken after fasting for at least 8 hours.  Glucose, capillary     Status: Abnormal    Collection Time: 08/09/23  4:04 PM  Result Value Ref Range    Glucose-Capillary 120 (H) 70 - 99 mg/dL      Comment: Glucose reference range applies only to samples taken after fasting for at least 8 hours.  Glucose, capillary     Status: Abnormal    Collection Time: 08/09/23 10:08 PM  Result Value Ref Range    Glucose-Capillary 121 (H) 70 - 99 mg/dL      Comment: Glucose reference range applies only to samples taken after fasting for at least 8 hours.  Glucose, capillary     Status: Abnormal    Collection Time: 08/10/23  6:34 AM  Result Value Ref Range    Glucose-Capillary 111 (H) 70 - 99 mg/dL      Comment: Glucose reference range applies only to samples taken after fasting for at least 8 hours.  CBC     Status: None    Collection Time: 08/10/23  7:46 AM  Result Value Ref Range    WBC 8.4 4.0 - 10.5 K/uL    RBC 4.95 3.87 - 5.11 MIL/uL    Hemoglobin  13.8 12.0 - 15.0 g/dL    HCT 40.9 81.1 - 91.4 %    MCV 87.5 80.0 - 100.0 fL    MCH 27.9 26.0 - 34.0 pg    MCHC 31.9 30.0 - 36.0 g/dL    RDW 78.2 95.6 - 21.3 %    Platelets 246 150 - 400 K/uL    nRBC 0.0 0.0 - 0.2 %      Comment: Performed at [facility], [address], [place], [place] [address]  Basic metabolic panel     Status: Abnormal    Collection Time: 08/10/23  7:46 AM  Result Value Ref Range    Sodium 138 135 - 145 mmol/L    Potassium 4.2 3.5 - 5.1 mmol/L    Chloride 107 98 - 111 mmol/L    CO2 20 (L) 22 - 32 mmol/L    Glucose, Bld 113 (H) 70 - 99 mg/dL      Comment: Glucose reference range applies only to samples taken after fasting for at least 8 hours.    BUN 21 8 - 23 mg/dL    Creatinine, Ser 8.46 0.44 - 1.00 mg/dL    Calcium 9.0 8.9 - 96.2 mg/dL    GFR, Estimated >95 >28 mL/min      Comment: (NOTE) Calculated using the CKD-EPI Creatinine Equation (2021)      Anion gap 11 5 - 15      Comment: Performed at [facility], [address] [place], [place] [address]      Imaging Results (Last 48 hours)  No results found.         Blood pressure (!) 151/78, pulse 69, temperature 97.6 F (36.4 C), temperature source Axillary, resp. rate 18, height 5' (1.524 m), weight 62.5 kg, SpO2 100%.   Medical Problem List and Plan: 1. 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  Antithrombotics: -DVT/anticoagulation:  Pharmaceutical: Lovenox>start             -antiplatelet therapy: Aspirin and Plavix for three weeks followed by aspirin alone (stop Plavix 10/17)   3. Pain Management: Tylenol as needed   4. Mood/Behavior/Sleep: LCSW to evaluate and provide emotional support             -pt appears positive and up beat. Wants to regain independence!             -continue Effexor-XR 75  mg q AM             -antipsychotic agents: n/a   5. Neuropsych/cognition: This patient is capable of making decisions on her own behalf.   6. Skin/Wound Care: Routine skin care checks   7. Fluids/Electrolytes/Nutrition: Routine Is and Os and follow-up chemistries   8: Hypertension: monitor TID and prn             -continue Lopressor 25 mg daily   9: Hyperlipidemia: continue statin   10: GERD: continue home famotidine   11: Hyperthyroidism with palpitations: continue methimazole and BB   12: History of asthma: continue albuterol MDI as needed   13: Prediabetes: carb modified diet   -sugars have been well controlled. Probably can dc cbg's within the next 24 hours if they remain stable       Milinda Antis, PA-C 08/10/2023

## 2023-08-10 NOTE — Progress Notes (Signed)
Inpatient Rehab Admissions Coordinator:    I have insurance approval and a bed available for pt to admit to CIR today. Dr. Roda Shutters in agreement.  Will let pt/family and TOC team know.   Estill Dooms, PT, DPT Admissions Coordinator [phone] 08/10/23  9:54 AM

## 2023-08-10 NOTE — TOC Transition Note (Signed)
Transition of Care [facility]) - CM/SW Discharge Note   Patient Details  Name: Priscilla Wilson MRN: [id number] Date of Birth: 1951-10-06  [facility] CM/SW Contact:  Kermit Balo, RN Phone Number: 08/10/2023, 9:55 AM   Clinical Narrative:     Pt is discharging to CIR today. CM signing off.   Final next level of care: IP Rehab Facility Barriers to Discharge: No Barriers Identified   Patient Goals and CMS Choice      Discharge Placement                         Discharge Plan and Services Additional resources added to the After Visit Summary for                                       Social Determinants of Health (SDOH) Interventions SDOH Screenings   Food Insecurity: No Food Insecurity (08/05/2023)  Housing: Low Risk  (08/05/2023)  Transportation Needs: No Transportation Needs (08/05/2023)  Utilities: Not At Risk (08/05/2023)  Tobacco Use: Low Risk  (08/03/2023)     Readmission Risk Interventions     No data to display

## 2023-08-10 NOTE — Discharge Summary (Signed)
Physician Discharge Summary  Patient ID: Priscilla Wilson MRN: [id number] DOB/AGE: 1951/02/06 72 y.o.  Admit date: 08/10/2023 Discharge date: 08/21/2023  Discharge Diagnoses:  Principal Problem:   Acute CVA (cerebrovascular accident) [facility]) Active Problems:   Essential hypertension   Alteration of sensations, post-stroke Active problems: Functional deficits secondary to acute right thalamic capsular infarct Hyper thyroidism Asthma Hyperlipidemia Gastroesophageal reflux disease Prediabetes  Discharged Condition: good  Significant Diagnostic Studies: none  Labs:  Basic Metabolic Panel: Recent Labs  Lab 08/17/23 0727  NA 138  K 4.3  CL 106  CO2 21*  GLUCOSE 126*  BUN 23  CREATININE 1.03*  CALCIUM 9.6    CBC: Recent Labs  Lab 08/17/23 0727  WBC 7.0  HGB 14.6  HCT 45.3  MCV 86.5  PLT 240   Brief HPI:   Priscilla Wilson is a 72 y.o. female presented to the [facility] ED on 08/03/2023 with sudden onset of left-sided numbness and weakness. She was emergently evaluated by teleneurology and she was given TNKase. PMH significant for hyperthyroidism on methimazole, asthma, hyperlipidemia, GERD. Methimazole initiated with improvement in heart rate, anxiety. Metoprolol ordered with improvement in blood pressure, Cleviprex off. 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 2D echo LVEF 70-75%. Hgb A1C 6.3%. Now on aspirin 81 mg daily and clopidogrel 75 mg daily for 3 weeks then aspirin alone. Recommend 30 day cardiac event monitoring. Patient continues to need up to mod A to navigate rolling walker when turning and max cues for placement of LLE. Increased cues needed for awareness of LUE with bed mobility and functional mobility.    Hospital Course: Priscilla Wilson was admitted to rehab 08/10/2023 for inpatient therapies to consist of PT,  ST and OT at least three hours five days a week. Past admission physiatrist, therapy team and rehab RN have worked together to provide customized collaborative inpatient rehab.  Labs stable. Facial numbness improved. Ambulating greater than 200 feet 10/04 and Lovenox discontinued. Follow-up labs 10/07 stable. Left finger tips still numb. Overall stable and ready for discharge home on 08/21/2023   Blood pressures were monitored on TID basis and Lopressor 25 mg daily continued.  Pre-Diabetes has been monitored with ac/hs CBG checks. Sugars well controlled. CBGs discontinued 10/02. Regular diet started.  Rehab course: During patient's stay in rehab weekly team conferences were held to monitor patient's progress, set goals and discuss barriers to discharge. At admission, patient required supervision with basic self-care skills, min assist with mobility.  She has had improvement in activity tolerance, balance, postural control as well as ability to compensate for deficits. She has had improvement in functional use LUE  and LLE as well as improvement in awareness. Patient has met 12 of 12 long term goals due to improved activity tolerance, improved balance, postural control, ability to compensate for deficits, functional use of  LEFT upper and LEFT lower extremity, and improved coordination. Pt has made significant progress during LOS with pt completing ambulatory ADLs at [facility] level. Pt demonstrates functional use of LUE during ADLs but continues to present with mild ataxia during functional reaching however pt incorporates compensatory strategies appropriately during ADLs. Pts husband was present for family education and demonstrated competency with assisting pt as needed.  Patient to discharge at overall Modified Independent level.  Patient's care partner is independent to provide the necessary physical assistance at discharge.    Outpatient PT and OT arranged  at [facility]   Discharge disposition:  01-Home or Self Care     Diet: Regular  Special Instructions: No driving, alcohol consumption or tobacco use.  30-35 minutes were spent on discharge planning and discharge summary.  Discharge Instructions     Ambulatory referral to Neurology   Complete by: As directed    An appointment is requested in approximately: 4 weeks   Ambulatory referral to Physical Medicine Rehab   Complete by: As directed    Hospital follow-up   Discharge patient   Complete by: As directed    Discharge disposition: 01-Home or Self Care   Discharge patient date: 08/21/2023      Allergies as of 08/21/2023       Reactions   Cat Hair Extract Hives, Shortness Of Breath, Itching, Swelling   Shellfish Allergy Hives, Itching   Sulfa Antibiotics Rash        Medication List     STOP taking these medications    hydrALAZINE 20 MG/ML injection Commonly known as: APRESOLINE   ipratropium-albuterol 0.5-2.5 (3) MG/3ML Soln Commonly known as: DUONEB   labetalol 5 MG/ML injection Commonly known as: NORMODYNE   mouth rinse Liqd solution   senna-docusate 8.6-50 MG tablet Commonly known as: Senokot-S       TAKE these medications    acetaminophen 325 MG tablet Commonly known as: TYLENOL Take 1-2 tablets (325-650 mg total) by mouth every 4 (four) hours as needed for mild pain. What changed:  how much to take reasons to take this Another medication with the same name was removed. Continue taking this medication, and follow the directions you see here.   aspirin EC 81 MG tablet Take 1 tablet (81 mg total) by mouth daily. Swallow whole.   clopidogrel 75 MG tablet Commonly known as: PLAVIX Take 1 tablet (75 mg total) by mouth daily for 6 days.   famotidine 40 MG tablet Commonly known as: PEPCID Take 1 tablet (40 mg total) by mouth at bedtime.   melatonin 5 MG Tabs Take 1 tablet (5 mg total) by mouth at bedtime.   methimazole 5 MG tablet Commonly known as: TAPAZOLE Take 5 mg by mouth  daily.   metoprolol tartrate 25 MG tablet Commonly known as: LOPRESSOR Take 1 tablet (25 mg total) by mouth 2 (two) times daily.   Multi Vitamin Tabs Take 1 tablet by mouth daily.   rosuvastatin 20 MG tablet Commonly known as: CRESTOR Take 1 tablet (20 mg total) by mouth daily.   venlafaxine XR 75 MG 24 hr capsule Commonly known as: EFFEXOR-XR Take 75 mg by mouth daily with breakfast.        Follow-up Information     Sigmund Hazel, MD Follow up.   Specialty: Family Medicine Why: Call the office in 1-2 days to make arrangements for hospital follow-up appointment. Contact information: [address] [place] [address] [phone]         Erick Colace, MD Follow up.   Specialty: Physical Medicine and Rehabilitation Why: office will call you to arrange your appt (sent) Contact information: [address] [place] [address] [phone]         [facility] Follow up.   Specialty: Neurology Why: Call the office in 1-2 days to make arrangements for hospital follow-up appointment. Contact information: [address] [place] [address] [phone]                Signed: Lynnell Jude  Yaniv Lage 08/21/2023, 9:55 AM

## 2023-08-10 NOTE — Progress Notes (Signed)
Ranelle Oyster, MD  Physician Physical Medicine and Rehabilitation   PMR Pre-admission    Signed   Date of Service: 08/10/2023 10:17 AM  Related encounter: ED to Hosp-Admission (Discharged) from 08/03/2023 in [facility]   Signed     Expand All Collapse All  PMR Admission Coordinator Pre-Admission Assessment   Patient: Priscilla Wilson is an 72 y.o., female MRN: [id number] DOB: Jun 13, 1951 Height: 5' (152.4 cm) Weight: 62.5 kg                                                                                                                                                  Insurance Information HMO:     PPO: yes     PCP:      IPA:      80/20:      OTHER:  PRIMARY: [employer]      Policy#: [id number]      Subscriber: pt CM Name: [employer]      Phone#: [phone]     Fax#: epic access Pre-Cert#: [id number] auth for CIR for admit 9/30 with updates due weekly, HTA has epic access for review     Employer:  Benefits:  Phone #: [phone]     Name:  Eff. Date: 11/10/22     Deduct: $0      Out of Pocket Max: $3200 ($446 met)      Life Max: n/a  CIR: $295/day for days 1-6      SNF: 20 full days Outpatient:      Co-Pay: $15/visit Home Health: 100%      Co-Pay:  DME: 80%     Co-Pay: 20% Providers:  SECONDARY:       Policy#:       Phone#:    Artist:       Phone#:    The Engineer, materials Information Summary" for patients in Inpatient Rehabilitation Facilities with attached "Privacy Act Statement-Health Care Records" was provided and verbally reviewed with: Patient and Family   Emergency Contact Information Contact Information       Name Relation Home Work Mobile    Ruth Spouse [phone]             Other Contacts   None on File      Current Medical History  Patient Admitting Diagnosis: CVA    History of Present Illness: Pt is a 72 y/o female with a history of GERD and asthma who presented initially with left-sided sensory loss  and impaired coordination on 08/03/2023. She received TNK initially and was admitted. 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 Neurology feels that the etiology is cryptogenic but may be related to atrial fibrillation. Patient was placed on aspirin 81 mg  and Plavix 75 mg daily for 3 weeks then the plan is to transition to aspirin alone.    Complete NIHSS TOTAL: 5 Glasgow Coma Scale Score: 15   Patient's medical record from [facility] has been reviewed by the rehabilitation admission coordinator and physician.   Past Medical History  History reviewed. No pertinent past medical history.       Has the patient had major surgery during 100 days prior to admission? No   Family History  family history includes Cancer - Ovarian in her sister.     Current Medications   Current Medications    Current Facility-Administered Medications:    acetaminophen (TYLENOL) tablet 650 mg, 650 mg, Oral, Q4H PRN **OR** acetaminophen (TYLENOL) 160 MG/5ML solution 650 mg, 650 mg, Per Tube, Q4H PRN **OR** acetaminophen (TYLENOL) suppository 650 mg, 650 mg, Rectal, Q4H PRN, Erick Blinks, MD   aspirin EC tablet 81 mg, 81 mg, Oral, Daily, Francena Hanly, RPH, 81 mg at 08/07/23 1610   Chlorhexidine Gluconate Cloth 2 % PADS 6 each, 6 each, Topical, Q0600, Erick Blinks, MD, 6 each at 08/06/23 0511   clevidipine (CLEVIPREX) infusion 0.5 mg/mL, 0-21 mg/hr, Intravenous, Continuous, Erick Blinks, MD, Stopped at 08/04/23 1203   clopidogrel (PLAVIX) tablet 75 mg, 75 mg, Oral, Daily, Cindie Laroche, Stevi W, NP, 75 mg at 08/07/23 0842   famotidine (PEPCID) tablet 40 mg, 40 mg, Oral, QHS, Toberman, Stevi W, NP, 40 mg at 08/06/23 2111   hydrALAZINE (APRESOLINE) injection 10 mg, 10 mg, Intravenous, Q4H PRN, Kara Mead, NP, 10 mg at 08/04/23 1809   insulin aspart (novoLOG)  injection 0-6 Units, 0-6 Units, Subcutaneous, TID WC, de La Torre, Cortney E, NP   ipratropium-albuterol (DUONEB) 0.5-2.5 (3) MG/3ML nebulizer solution 3 mL, 3 mL, Nebulization, Q4H PRN, Erick Blinks, MD, 3 mL at 08/04/23 1355   labetalol (NORMODYNE) injection 10 mg, 10 mg, Intravenous, Q2H PRN, Cindie Laroche, Stevi W, NP, 10 mg at 08/04/23 1647   methimazole (TAPAZOLE) tablet 5 mg, 5 mg, Oral, Daily, Pearlean Brownie, Pramod S, MD, 5 mg at 08/07/23 0841   metoprolol tartrate (LOPRESSOR) tablet 25 mg, 25 mg, Oral, BID, Micki Riley, MD, 25 mg at 08/07/23 9604   Oral care mouth rinse, 15 mL, Mouth Rinse, PRN, Erick Blinks, MD   senna-docusate (Senokot-S) tablet 1 tablet, 1 tablet, Oral, QHS PRN, Erick Blinks, MD   simvastatin (ZOCOR) tablet 40 mg, 40 mg, Oral, Daily, Toberman, Stevi W, NP, 40 mg at 08/07/23 0841   venlafaxine XR (EFFEXOR-XR) 24 hr capsule 75 mg, 75 mg, Oral, Q breakfast, Erick Blinks, MD, 75 mg at 08/07/23 5409     Patients Current Diet:  Diet Order                  Diet regular Room service appropriate? Yes with Assist; Fluid consistency: Thin  Diet effective now                         Precautions / Restrictions Precautions Precautions: Fall Precaution Comments: BP goal <180/105 Restrictions Weight Bearing Restrictions: No    Has the patient had 2 or more falls or a fall with injury in the past year?No   Prior Activity Level Community (5-7x/wk): indep, active prior to admit, no DME, driving   Prior Functional Level Prior Function Prior Level of Function : Driving, Independent/Modified Independent Mobility Comments: No AD   Self Care: Did the patient need help bathing, dressing, using the toilet or  eating?  Independent   Indoor Mobility: Did the patient need assistance with walking from room to room (with or without device)? Independent   Stairs: Did the patient need assistance with internal or external stairs (with or without device)?  Independent   Functional Cognition: Did the patient need help planning regular tasks such as shopping or remembering to take medications? Independent   Patient Information Are you of Hispanic, Latino/a,or Spanish origin?: A. No, not of Hispanic, Latino/a, or Spanish origin What is your race?: A. White Do you need or want an interpreter to communicate with a doctor or health care staff?: 0. No   Patient's Response To:  Health Literacy and Transportation Is the patient able to respond to health literacy and transportation needs?: Yes Health Literacy - How often do you need to have someone help you when you read instructions, pamphlets, or other written material from your doctor or pharmacy?: Never In the past 12 months, has lack of transportation kept you from medical appointments or from getting medications?: No In the past 12 months, has lack of transportation kept you from meetings, work, or from getting things needed for daily living?: No   Home Assistive Devices / Equipment Home Equipment: Agricultural consultant (2 wheels), Shower seat, Grab bars - tub/shower   Prior Device Use: Indicate devices/aids used by the patient prior to current illness, exacerbation or injury? None of the above   Current Functional Level Cognition   Overall Cognitive Status: Within Functional Limits for tasks assessed Orientation Level: Oriented X4 General Comments: patient is motivated to participate    Extremity Assessment (includes Sensation/Coordination)   Upper Extremity Assessment: Defer to OT evaluation LUE Deficits / Details: Decreased sensation throughout entire L side, ataxic movement with LUE LUE Sensation: decreased light touch LUE Coordination: decreased fine motor, decreased gross motor  Lower Extremity Assessment: LLE deficits/detail LLE Deficits / Details: MMT scores of 4 grossly (weaker than R side); decreased sensation throughout L leg; ataxic movement noted LLE Sensation: decreased light  touch, decreased proprioception LLE Coordination: decreased gross motor, decreased fine motor     ADLs   Overall ADL's : Needs assistance/impaired Eating/Feeding: Set up, Sitting Grooming: Wash/dry hands, Wash/dry face, Oral care, Set up, Standing Upper Body Bathing: Contact guard assist, Sitting Lower Body Bathing: Sitting/lateral leans, Sit to/from stand, Moderate assistance Upper Body Dressing : Contact guard assist, Sitting Lower Body Dressing: Minimal assistance, Sit to/from stand, Sitting/lateral leans Toilet Transfer: Ambulation, Rolling walker (2 wheels), Moderate assistance Toilet Transfer Details (indicate cue type and reason): close mod A especially for turns Toileting- Architect and Hygiene: Sit to/from stand, Sitting/lateral lean, Moderate assistance Functional mobility during ADLs: Minimal assistance, Cueing for safety, Cueing for sequencing, Rolling walker (2 wheels) General ADL Comments: Session focus on increasing overall awareness with LUE (FMC exercises provided but did not provide entire handout) and ADLs in standing. Patient needing up to mod A to navigate rolling walker when turning and max cues for placement of LLE. Recommendation remains appopriate, OT will continue to follow.     Mobility   Overal bed mobility: Needs Assistance Bed Mobility: Supine to Sit, Sit to Supine Supine to sit: Contact guard Sit to supine: Contact guard assist General bed mobility comments: increased time required     Transfers   Overall transfer level: Needs assistance Equipment used: Rolling walker (2 wheels) Transfers: Sit to/from Stand Sit to Stand: Min assist General transfer comment: lifting assistance required for walking. cues for hand placement for safety  Ambulation / Gait / Stairs / Wheelchair Mobility   Ambulation/Gait Ambulation/Gait assistance: Mod assist, Min assist Gait Distance (Feet):  (36ft, 53ft, 72ft) Assistive device: Rolling walker (2  wheels) Gait Pattern/deviations: Step-to pattern, Step-through pattern, Ataxic, Knee hyperextension - left General Gait Details: ataxic steps with LLE with varying step length. cues for appropriate foot placement including step length, base of support, and position in relation to rolling walker. intermittent decreased dorsiflexion noted on the left with cues for heel-toe gait pattern. several standing rest breaks required. Gait velocity: decreased Gait velocity interpretation: <1.31 ft/sec, indicative of household ambulator     Posture / Balance Dynamic Sitting Balance Sitting balance - Comments: supervision for safety Balance Overall balance assessment: Needs assistance Sitting-balance support: No upper extremity supported, Feet supported Sitting balance-Leahy Scale: Fair Sitting balance - Comments: supervision for safety Standing balance support: Bilateral upper extremity supported Standing balance-Leahy Scale: Poor Standing balance comment: relying on rolling walker for support in standing     Special needs/care consideration N/a        Previous Home Environment (from acute therapy documentation) Living Arrangements: Spouse/significant other  Lives With: Spouse Available Help at Discharge: Family, Available 24 hours/day Type of Home: House Home Layout: Two level, Full bath on main level, Able to live on main level with bedroom/bathroom Alternate Level Stairs-Number of Steps: 14 Home Access: Stairs to enter Entrance Stairs-Rails: Right, Left Entrance Stairs-Number of Steps: 4 Bathroom Shower/Tub: Walk-in shower, Other (comment) (Walk in tub as well) Bathroom Toilet: Handicapped height Bathroom Accessibility: Yes How Accessible: Accessible via walker Home Care Services: No   Discharge Living Setting Plans for Discharge Living Setting: Patient's home, Lives with (comment) (spouse) Type of Home at Discharge: House Discharge Home Layout: Able to live on main level with  bedroom/bathroom Discharge Home Access: Stairs to enter Entrance Stairs-Rails: Right, Left Entrance Stairs-Number of Steps: 4 Discharge Bathroom Shower/Tub: Walk-in shower Discharge Bathroom Toilet: Handicapped height Discharge Bathroom Accessibility: Yes How Accessible: Accessible via walker Does the patient have any problems obtaining your medications?: No   Social/Family/Support Systems Patient Roles: Spouse Anticipated Caregiver: spouse, Maisie Fus Anticipated Industrial/product designer Information: [phone] Ability/Limitations of Caregiver: none stated Caregiver Availability: 24/7 Discharge Plan Discussed with Primary Caregiver: Yes Is Caregiver In Agreement with Plan?: Yes Does Caregiver/Family have Issues with Lodging/Transportation while Pt is in Rehab?: No     Goals Patient/Family Goal for Rehab: PT/OT supervision to mod I, SLP n/a Expected length of stay: 10-12 days Additional Information: Discharge plan: return to pt's home with spouse providing 24/7 supervision Pt/Family Agrees to Admission and willing to participate: Yes Program Orientation Provided & Reviewed with Pt/Caregiver Including Roles  & Responsibilities: Yes  Barriers to Discharge: Insurance for SNF coverage     Decrease burden of Care through IP rehab admission: n/a     Possible need for SNF placement upon discharge: Not anticipated.  Plan return to pt's home with spouse available 24/7.      Patient Condition: This patient's condition remains as documented in the consult dated 9/27, in which the Rehabilitation Physician determined and documented that the patient's condition is appropriate for intensive rehabilitative care in an inpatient rehabilitation facility. Will admit to inpatient rehab today.   Preadmission Screen Completed By:  Stephania Fragmin, PT, DPT 08/07/2023 3:36 PM ______________________________________________________________________   Discussed status with Dr. Riley Kill on 08/10/23 at  9:57 AM  and  received approval for admission today.   Admission Coordinator:  Stephania Fragmin, PT, DPT time 9:57 AM Dorna Bloom 08/10/23  Revision History

## 2023-08-10 NOTE — Discharge Summary (Deleted)
.  sb

## 2023-08-10 NOTE — H&P (Signed)
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 She was emergently evaluated by teleneurology and she was given TNKase. PMH significant for hyperthyroidism on methimazole, asthma, hyperlipidemia, GERD. Methimazole initiated with improvement in heart rate, anxiety. Metoprolol ordered with improvement in blood pressure, Cleviprex off. 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 2D echo LVEF 70-75%. Hgb A1C 6.3%. Now on aspirin 81 mg daily and clopidogrel 75 mg daily for 3 weeks then aspirin alone. Recommend 30 day cardiac event monitoring. Patient continues to need up to mod A to navigate rolling walker when turning and max cues for placement of LLE. Increased cues needed for awareness of LUE with bed mobility and functional mobility. The patient requires inpatient medicine and rehabilitation evaluations and services for ongoing dysfunction secondary to right thalamic capsular infarct.  Review of Systems  Constitutional:  Negative for chills and fever.  HENT:  Negative for congestion and sore throat.   Respiratory:  Negative for cough and shortness of breath.   Cardiovascular:  Negative for chest pain and leg swelling.  Genitourinary:  Negative for dysuria and urgency.  Neurological:  Negative for dizziness and headaches.  Psychiatric/Behavioral:  Negative for depression. The patient is not nervous/anxious.    History reviewed. No pertinent past medical history. Past Surgical History:  Procedure Laterality Date   BREAST EXCISIONAL BIOPSY Left    benign   Family History  Problem  Relation Age of Onset   Cancer - Ovarian Sister    Social History:  reports that she has never smoked. She has been exposed to tobacco smoke. She has never used smokeless tobacco. She reports that she does not currently use alcohol. She reports that she does not use drugs. Allergies:  Allergies  Allergen Reactions   Cat Hair Extract Hives, Shortness Of Breath, Itching and Swelling   Shellfish Allergy Hives and Itching   Sulfa Antibiotics Rash   Medications Prior to Admission  Medication Sig Dispense Refill   albuterol (VENTOLIN HFA) 108 (90 Base) MCG/ACT inhaler Inhale 4 puffs into the lungs every 4 (four) hours as needed for wheezing or shortness of breath. 18 g 1   calcium-vitamin D (OSCAL WITH D) 250-125 MG-UNIT tablet Take 2 tablets by mouth daily.     famotidine (PEPCID) 40 MG tablet Take 1 tablet (40 mg total) by mouth at bedtime. 90 tablet 3   Fluticasone-Salmeterol (ADVAIR DISKUS IN) Inhale 1 puff into the lungs in the morning and at bedtime.     meloxicam (MOBIC) 15 MG tablet Take 15 mg by mouth daily.     methimazole (TAPAZOLE) 5 MG tablet Take 5 mg by mouth daily.     Multiple Vitamin (MULTI VITAMIN) TABS Take 1 tablet by mouth daily.     predniSONE (DELTASONE) 10 MG tablet Take 3 tabs (30mg ) twice daily for 3 days, then 2 tabs (20mg ) twice daily for 3 days, then 1 tab (10mg ) twice daily for 3 days, then STOP. 36 tablet 0   simvastatin (ZOCOR) 10 MG tablet Take 10 mg by mouth daily.     venlafaxine XR (EFFEXOR-XR) 75 MG 24 hr capsule Take 75 mg by mouth daily with breakfast.  Home: Home Living Family/patient expects to be discharged to:: Private residence Living Arrangements: Spouse/significant other Available Help at Discharge: Family, Available 24 hours/day Type of Home: House Home Access: Stairs to enter [employer] of Steps: 4 Entrance Stairs-Rails: Right, Left Home Layout: Two level, Full bath on main level, Able to live on main level with  bedroom/bathroom Alternate Level Stairs-Number of Steps: 14 Bathroom Shower/Tub: Walk-in shower, Other (comment) (Walk in tub as well) Bathroom Toilet: Handicapped height Bathroom Accessibility: Yes Home Equipment: Agricultural consultant (2 wheels), Shower seat, Grab bars - tub/shower  Lives With: Spouse   Functional History: Prior Function Prior Level of Function : Driving, Independent/Modified Independent Mobility Comments: No AD  Functional Status:  Mobility: Bed Mobility Overal bed mobility: Needs Assistance Bed Mobility: Supine to Sit Supine to sit: Contact guard Sit to supine: Contact guard assist General bed mobility comments: Cues for pacing and attention to LUE in relation to body Transfers Overall transfer level: Needs assistance Equipment used: Rolling walker (2 wheels) Transfers: Sit to/from Stand Sit to Stand: Min assist General transfer comment: Cues for hand placement and pacing, improved ability to maintain LUE on RW Ambulation/Gait Ambulation/Gait assistance: Mod assist, Min assist Gait Distance (Feet):  (7ft, 89ft, 90ft) Assistive device: Rolling walker (2 wheels) Gait Pattern/deviations: Step-to pattern, Step-through pattern, Ataxic, Knee hyperextension - left General Gait Details: ataxic steps with LLE with varying step length. cues for appropriate foot placement including step length, base of support, and position in relation to rolling walker. intermittent decreased dorsiflexion noted on the left with cues for heel-toe gait pattern. several standing rest breaks required. Gait velocity: decreased Gait velocity interpretation: <1.31 ft/sec, indicative of household ambulator    ADL: ADL Overall ADL's : Needs assistance/impaired Eating/Feeding: Set up, Sitting Grooming: Wash/dry hands, Wash/dry face, Oral care, Set up, Standing, Applying deodorant Upper Body Bathing: Contact guard assist, Standing Lower Body Bathing: Sitting/lateral leans, Sit to/from stand,  Moderate assistance Upper Body Dressing : Minimal assistance, Standing Upper Body Dressing Details (indicate cue type and reason): min A to doff and don gown in standing Lower Body Dressing: Minimal assistance, Sit to/from stand, Sitting/lateral leans Toilet Transfer: Ambulation, Rolling walker (2 wheels), Moderate assistance Toilet Transfer Details (indicate cue type and reason): close mod A especially for turns Toileting- Architect and Hygiene: Sit to/from stand, Sitting/lateral lean, Moderate assistance Functional mobility during ADLs: Minimal assistance, Cueing for safety, Cueing for sequencing, Rolling walker (2 wheels) General ADL Comments: Session focus on increasing overall awareness with functional mobility and ADLs in standing. Patient continues to need up to mod A to navigate rolling walker when turning and max cues for placement of LLE. Recommendation remains appopriate, OT will continue to follow.  Cognition: Cognition Overall Cognitive Status: Within Functional Limits for tasks assessed Orientation Level: Oriented X4 Cognition Arousal: Alert Behavior During Therapy: WFL for tasks assessed/performed Overall Cognitive Status: Within Functional Limits for tasks assessed General Comments: Patient motivated and appropriate  Physical Exam: Blood pressure (!) 151/78, pulse 69, temperature 97.6 F (36.4 C), temperature source Axillary, resp. rate 18, height 5' (1.524 m), weight 62.5 kg, SpO2 100%. Physical Exam Constitutional:      General: She is not in acute distress. HENT:     Head: Normocephalic.     Right Ear: External ear normal.     Left Ear: External ear normal.     Nose: Nose normal.     Mouth/Throat:     Pharynx: Oropharynx is clear.  Eyes:     Extraocular Movements:  Extraocular movements intact.     Pupils: Pupils are equal, round, and reactive to light.  Cardiovascular:     Rate and Rhythm: Normal rate and regular rhythm.     Heart sounds: No murmur  heard.    No gallop.  Pulmonary:     Effort: Pulmonary effort is normal. No respiratory distress.     Breath sounds: Normal breath sounds. No wheezing.  Abdominal:     General: Bowel sounds are normal. There is no distension.     Palpations: Abdomen is soft.     Tenderness: There is no abdominal tenderness.  Musculoskeletal:        General: No swelling or tenderness.     Cervical back: Normal range of motion and neck supple.  Skin:    General: Skin is warm and dry.  Neurological:     Mental Status: She is alert and oriented to person, place, and time.     Comments: Alert and oriented x 3. Normal insight and awareness. Intact Memory. Normal language and speech. Cranial nerve exam unremarkable except for left perioral sensory loss. MMT: RUE 5/5. LUE 4+/5. RLE 5/5. LLE 4 to 4+/5. Significant limb ataxia LUE and LLE with FTN and HTS. Sensation 1 to 1+/2 left arm and leg. DTRs 1+. No abnl resting tone.Marland Kitchen    Psychiatric:        Mood and Affect: Mood normal.        Behavior: Behavior normal.     Results for orders placed or performed during the hospital encounter of 08/03/23 (from the past 48 hour(s))  Glucose, capillary     Status: Abnormal   Collection Time: 08/08/23 11:25 AM  Result Value Ref Range   Glucose-Capillary 64 (L) 70 - 99 mg/dL    Comment: Glucose reference range applies only to samples taken after fasting for at least 8 hours.   Comment 1 Notify RN   Glucose, capillary     Status: Abnormal   Collection Time: 08/08/23 12:05 PM  Result Value Ref Range   Glucose-Capillary 111 (H) 70 - 99 mg/dL    Comment: Glucose reference range applies only to samples taken after fasting for at least 8 hours.   Comment 1 Notify RN   Glucose, capillary     Status: Abnormal   Collection Time: 08/08/23  3:49 PM  Result Value Ref Range   Glucose-Capillary 119 (H) 70 - 99 mg/dL    Comment: Glucose reference range applies only to samples taken after fasting for at least 8 hours.  Glucose,  capillary     Status: Abnormal   Collection Time: 08/08/23  9:02 PM  Result Value Ref Range   Glucose-Capillary 143 (H) 70 - 99 mg/dL    Comment: Glucose reference range applies only to samples taken after fasting for at least 8 hours.   Comment 1 Notify RN    Comment 2 Document in Chart   Glucose, capillary     Status: Abnormal   Collection Time: 08/09/23  6:16 AM  Result Value Ref Range   Glucose-Capillary 115 (H) 70 - 99 mg/dL    Comment: Glucose reference range applies only to samples taken after fasting for at least 8 hours.   Comment 1 Notify RN    Comment 2 Document in Chart   Glucose, capillary     Status: None   Collection Time: 08/09/23 10:24 AM  Result Value Ref Range   Glucose-Capillary 89 70 - 99 mg/dL    Comment: Glucose reference range  applies only to samples taken after fasting for at least 8 hours.  Glucose, capillary     Status: Abnormal   Collection Time: 08/09/23  4:04 PM  Result Value Ref Range   Glucose-Capillary 120 (H) 70 - 99 mg/dL    Comment: Glucose reference range applies only to samples taken after fasting for at least 8 hours.  Glucose, capillary     Status: Abnormal   Collection Time: 08/09/23 10:08 PM  Result Value Ref Range   Glucose-Capillary 121 (H) 70 - 99 mg/dL    Comment: Glucose reference range applies only to samples taken after fasting for at least 8 hours.  Glucose, capillary     Status: Abnormal   Collection Time: 08/10/23  6:34 AM  Result Value Ref Range   Glucose-Capillary 111 (H) 70 - 99 mg/dL    Comment: Glucose reference range applies only to samples taken after fasting for at least 8 hours.  CBC     Status: None   Collection Time: 08/10/23  7:46 AM  Result Value Ref Range   WBC 8.4 4.0 - 10.5 K/uL   RBC 4.95 3.87 - 5.11 MIL/uL   Hemoglobin 13.8 12.0 - 15.0 g/dL   HCT 02.7 25.3 - 66.4 %   MCV 87.5 80.0 - 100.0 fL   MCH 27.9 26.0 - 34.0 pg   MCHC 31.9 30.0 - 36.0 g/dL   RDW 40.3 47.4 - 25.9 %   Platelets 246 150 - 400 K/uL    nRBC 0.0 0.0 - 0.2 %    Comment: Performed at [facility], [address] [place], [place] [address]  Basic metabolic panel     Status: Abnormal   Collection Time: 08/10/23  7:46 AM  Result Value Ref Range   Sodium 138 135 - 145 mmol/L   Potassium 4.2 3.5 - 5.1 mmol/L   Chloride 107 98 - 111 mmol/L   CO2 20 (L) 22 - 32 mmol/L   Glucose, Bld 113 (H) 70 - 99 mg/dL    Comment: Glucose reference range applies only to samples taken after fasting for at least 8 hours.   BUN 21 8 - 23 mg/dL   Creatinine, Ser 5.64 0.44 - 1.00 mg/dL   Calcium 9.0 8.9 - 33.2 mg/dL   GFR, Estimated >95 >18 mL/min    Comment: (NOTE) Calculated using the CKD-EPI Creatinine Equation (2021)    Anion gap 11 5 - 15    Comment: Performed at [facility], [address] [place], [place] [address]   No results found.    Blood pressure (!) 151/78, pulse 69, temperature 97.6 F (36.4 C), temperature source Axillary, resp. rate 18, height 5' (1.524 m), weight 62.5 kg, SpO2 100%.  Medical Problem List and Plan: 1. 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  Antithrombotics: -DVT/anticoagulation:  Pharmaceutical: Lovenox>start  -antiplatelet therapy: Aspirin and Plavix for three weeks followed by aspirin alone (stop Plavix 10/17)  3. Pain Management: Tylenol as needed  4. Mood/Behavior/Sleep: LCSW to evaluate and provide emotional support  -pt appears positive and up beat. Wants to regain independence!  -continue Effexor-XR 75 mg q AM  -antipsychotic agents: n/a  5. Neuropsych/cognition: This patient is capable of making decisions on her own behalf.  6. Skin/Wound Care: Routine skin care checks   7. Fluids/Electrolytes/Nutrition: Routine Is and  Os and follow-up chemistries  8: Hypertension: monitor TID and prn  -continue Lopressor 25  mg daily  9: Hyperlipidemia: continue statin  10: GERD: continue home famotidine  11: Hyperthyroidism with palpitations: continue methimazole and BB  12: History of asthma: continue albuterol MDI as needed  13: Prediabetes: carb modified diet      Milinda Antis, PA-C 08/10/2023

## 2023-08-10 NOTE — Progress Notes (Signed)
Patient ID: Priscilla Wilson, female   DOB: Sep 07, 1951, 72 y.o.   MRN: [id number] Met with the patient to review current situation, rehab process, team conference and plan of care. Reviewed medications for secondary risk management including DAPT x 3 weeks then  ASA solo with statin and HTN meds. Reviewed prediabetes and CMM diet recommendations. Reviewed access to My Chart and follow up recommendations. Continue to follow along to address educational needs to facilitate preparation for discharge. Pamelia Hoit

## 2023-08-10 NOTE — Progress Notes (Addendum)
INPATIENT REHABILITATION ADMISSION NOTE   Arrival Method: wheelchair     Mental Orientation:alert   Assessment: done  Skin: Done   IV'S:right fore arm   Pain:none   Tubes and Drains:done   Safety Measures:done   Vital Signs:done   Height and Weight:done   Rehab Orientation:done   Family:none    Notes:

## 2023-08-10 NOTE — Discharge Summary (Addendum)
Stroke Discharge Summary  Patient ID: Priscilla Wilson   MRN: [id number]      DOB: [birth year]  Date of Admission: 08/03/2023 Date of Discharge: 08/10/2023  Attending Physician:  Stroke, Md, MD, Stroke MD Consultant(s):   rehabilitation medicine Patient's PCP:  Sigmund Hazel, MD  Discharge Diagnoses:  Stroke: Acute right thalamic capsular infarct, etiology likely small vessel disease   Active Problems: HTN HLD hyperthyroidism   Medications to be continued on Rehab Allergies as of 08/10/2023       Reactions   Cat Hair Extract Hives, Shortness Of Breath, Itching, Swelling   Shellfish Allergy Hives, Itching   Sulfa Antibiotics Rash        Medication List     STOP taking these medications    ADVAIR DISKUS IN   albuterol 108 (90 Base) MCG/ACT inhaler Commonly known as: VENTOLIN HFA   calcium-vitamin D 250-125 MG-UNIT tablet Commonly known as: OSCAL WITH D   meloxicam 15 MG tablet Commonly known as: MOBIC   predniSONE 10 MG tablet Commonly known as: DELTASONE   simvastatin 10 MG tablet Commonly known as: ZOCOR       TAKE these medications    acetaminophen 325 MG tablet Commonly known as: TYLENOL Take 2 tablets (650 mg total) by mouth every 4 (four) hours as needed for mild pain (or temp > 37.5 C (99.5 F)).   acetaminophen 650 MG suppository Commonly known as: TYLENOL Place 1 suppository (650 mg total) rectally every 4 (four) hours as needed for mild pain (or temp > 37.5 C (99.5 F)).   aspirin EC 81 MG tablet Take 1 tablet (81 mg total) by mouth daily. Swallow whole. Start taking on: August 11, 2023   clopidogrel 75 MG tablet Commonly known as: PLAVIX Take 1 tablet (75 mg total) by mouth daily. Start taking on: August 11, 2023   famotidine 40 MG tablet Commonly known as: PEPCID Take 1 tablet (40 mg total) by mouth at bedtime.   hydrALAZINE 20 MG/ML injection Commonly known as: APRESOLINE Inject 0.5 mLs (10 mg total) into the vein every 4 (four)  hours as needed.   ipratropium-albuterol 0.5-2.5 (3) MG/3ML Soln Commonly known as: DUONEB Take 3 mLs by nebulization every 4 (four) hours as needed.   labetalol 5 MG/ML injection Commonly known as: NORMODYNE Inject 2 mLs (10 mg total) into the vein every 2 (two) hours as needed.   methimazole 5 MG tablet Commonly known as: TAPAZOLE Take 5 mg by mouth daily.   metoprolol tartrate 25 MG tablet Commonly known as: LOPRESSOR Take 1 tablet (25 mg total) by mouth 2 (two) times daily.   mouth rinse Liqd solution 15 mLs by Mouth Rinse route as needed (for oral care).   Multi Vitamin Tabs Take 1 tablet by mouth daily.   rosuvastatin 20 MG tablet Commonly known as: CRESTOR Take 1 tablet (20 mg total) by mouth daily. Start taking on: August 11, 2023   senna-docusate 8.6-50 MG tablet Commonly known as: Senokot-S Take 1 tablet by mouth at bedtime as needed for mild constipation or moderate constipation.   venlafaxine XR 75 MG 24 hr capsule Commonly known as: EFFEXOR-XR Take 75 mg by mouth daily with breakfast.        LABORATORY STUDIES CBC    Component Value Date/Time   WBC 8.4 08/10/2023 0746   RBC 4.95 08/10/2023 0746   HGB 13.8 08/10/2023 0746   HCT 43.3 08/10/2023 0746   PLT 246 08/10/2023 0746   MCV  87.5 08/10/2023 0746   MCH 27.9 08/10/2023 0746   MCHC 31.9 08/10/2023 0746   RDW 13.2 08/10/2023 0746   LYMPHSABS 1.2 08/03/2023 1809   MONOABS 0.4 08/03/2023 1809   EOSABS 0.0 08/03/2023 1809   BASOSABS 0.0 08/03/2023 1809   CMP    Component Value Date/Time   NA 138 08/10/2023 0746   K 4.2 08/10/2023 0746   CL 107 08/10/2023 0746   CO2 20 (L) 08/10/2023 0746   GLUCOSE 113 (H) 08/10/2023 0746   BUN 21 08/10/2023 0746   CREATININE 0.93 08/10/2023 0746   CALCIUM 9.0 08/10/2023 0746   PROT 7.6 08/03/2023 1809   ALBUMIN 4.4 08/03/2023 1809   AST 22 08/03/2023 1809   ALT 22 08/03/2023 1809   ALKPHOS 99 08/03/2023 1809   BILITOT 0.3 08/03/2023 1809   GFRNONAA  >60 08/10/2023 0746   COAGS Lab Results  Component Value Date   INR 1.0 08/03/2023   Lipid Panel    Component Value Date/Time   CHOL 166 08/04/2023 0319   TRIG 78 08/04/2023 0319   HDL 62 08/04/2023 0319   CHOLHDL 2.7 08/04/2023 0319   VLDL 16 08/04/2023 0319   LDLCALC 88 08/04/2023 0319   HgbA1C  Lab Results  Component Value Date   HGBA1C 6.3 (H) 08/04/2023   Urinalysis    Component Value Date/Time   COLORURINE YELLOW 08/03/2023 2240   APPEARANCEUR CLEAR 08/03/2023 2240   LABSPEC 1.039 (H) 08/03/2023 2240   PHURINE 6.0 08/03/2023 2240   GLUCOSEU 150 (A) 08/03/2023 2240   HGBUR NEGATIVE 08/03/2023 2240   BILIRUBINUR NEGATIVE 08/03/2023 2240   KETONESUR NEGATIVE 08/03/2023 2240   PROTEINUR NEGATIVE 08/03/2023 2240   NITRITE NEGATIVE 08/03/2023 2240   LEUKOCYTESUR NEGATIVE 08/03/2023 2240   Urine Drug Screen     Component Value Date/Time   LABOPIA NONE DETECTED 08/03/2023 2240   COCAINSCRNUR NONE DETECTED 08/03/2023 2240   LABBENZ NONE DETECTED 08/03/2023 2240   AMPHETMU NONE DETECTED 08/03/2023 2240   THCU NONE DETECTED 08/03/2023 2240   LABBARB NONE DETECTED 08/03/2023 2240    Alcohol Level    Component Value Date/Time   ETH <10 08/03/2023 1829     SIGNIFICANT DIAGNOSTIC STUDIES MR BRAIN WO CONTRAST  Result Date: 08/04/2023 CLINICAL DATA:  Initial evaluation for neuro deficit, stroke. EXAM: MRI HEAD WITHOUT CONTRAST TECHNIQUE: Multiplanar, multiecho pulse sequences of the brain and surrounding structures were obtained without intravenous contrast. COMPARISON:  CTs from 08/03/2023. FINDINGS: Brain: Cerebral volume within normal limits. Patchy T2/FLAIR hyperintensity involving the periventricular deep white matter both cerebral hemispheres as well as the pons, consistent with chronic small vessel ischemic disease, moderately advanced in nature. 1.6 cm focus of restricted diffusion involving the right thalamocapsular region, consistent with an acute ischemic  infarct. Associated mild petechial blood products without frank hemorrhagic transformation or significant regional mass effect. No other evidence for acute or subacute ischemia. No other acute or chronic intracranial blood products. No mass lesion, midline shift or mass effect. No hydrocephalus or extra-axial fluid collection. Pituitary gland and suprasellar region within normal limits. Vascular: Major intracranial vascular flow voids are maintained. Skull and upper cervical spine: Craniocervical junction within normal limits. Bone marrow signal intensity normal. No scalp soft tissue abnormality. Sinuses/Orbits: Globes and orbital soft tissues within normal limits. Scattered mucosal thickening present about the frontoethmoidal and maxillary sinuses. No significant mastoid effusion. Other: None. IMPRESSION: 1. 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 Associated mild petechial blood products without frank hemorrhagic transformation or significant regional  mass effect. 2. Underlying moderately advanced chronic microvascular ischemic disease. Electronically Signed   By: Rise Mu M.D.   On: 08/04/2023 22:09   ECHOCARDIOGRAM COMPLETE  Result Date: 08/04/2023    ECHOCARDIOGRAM REPORT   Patient Name:   Priscilla Wilson Date of Exam: 08/04/2023 Medical Rec #:  [id number]      Height:       60.0 in Accession #:    [id number]     Weight:       137.8 lb Date of Birth:  1951/05/13      BSA:          1.593 m Patient Age:    72 years       BP:           197/97 mmHg Patient Gender: F              HR:           80 bpm. Exam Location:  Inpatient Procedure: 2D Echo, Cardiac Doppler and Color Doppler Indications:    Stroke I63.9  History:        Patient has no prior history of Echocardiogram examinations. TIA                 and Stroke; Risk Factors:Non-Smoker.  Sonographer:    Aron Baba Referring Phys: [id number] [facility] KHALIQDINA IMPRESSIONS  1. Left ventricular ejection fraction, by estimation, is 70 to 75%.  The left ventricle has hyperdynamic function. The left ventricle has no regional wall motion abnormalities. Left ventricular diastolic parameters are consistent with Grade I diastolic dysfunction (impaired relaxation).  2. Right ventricular systolic function is hyperdynamic. The right ventricular size is normal. There is normal pulmonary artery systolic pressure.  3. The mitral valve is grossly normal. No evidence of mitral valve regurgitation. No evidence of mitral stenosis.  4. The aortic valve is tricuspid. Aortic valve regurgitation is not visualized. Comparison(s): No prior Echocardiogram. FINDINGS  Left Ventricle: Small (6 mm Hg) intracavitary gradient related to hyperdynamic function. Left ventricular ejection fraction, by estimation, is 70 to 75%. The left ventricle has hyperdynamic function. The left ventricle has no regional wall motion abnormalities. The left ventricular internal cavity size was small. There is no left ventricular hypertrophy. Left ventricular diastolic parameters are consistent with Grade I diastolic dysfunction (impaired relaxation). Right Ventricle: The right ventricular size is normal. No increase in right ventricular wall thickness. Right ventricular systolic function is hyperdynamic. There is normal pulmonary artery systolic pressure. The tricuspid regurgitant velocity is 1.93 m/s, and with an assumed right atrial pressure of 8 mmHg, the estimated right ventricular systolic pressure is 22.9 mmHg. Left Atrium: Left atrial size was normal in size. Right Atrium: Right atrial size was normal in size. Pericardium: There is no evidence of pericardial effusion. Mitral Valve: The mitral valve is grossly normal. No evidence of mitral valve regurgitation. No evidence of mitral valve stenosis. Tricuspid Valve: The tricuspid valve is normal in structure. Tricuspid valve regurgitation is not demonstrated. No evidence of tricuspid stenosis. Aortic Valve: The aortic valve is tricuspid. Aortic  valve regurgitation is not visualized. Pulmonic Valve: The pulmonic valve was not well visualized. Pulmonic valve regurgitation is not visualized. No evidence of pulmonic stenosis. Aorta: The aortic root and ascending aorta are structurally normal, with no evidence of dilitation. IAS/Shunts: The atrial septum is grossly normal.  RIGHT VENTRICLE RV S prime:     19.20 cm/s TAPSE (M-mode): 1.2 cm TRICUSPID VALVE TR Peak grad:   14.9 mmHg TR Vmax:  193.00 cm/s Riley Lam MD Electronically signed by Riley Lam MD Signature Date/Time: 08/04/2023/9:48:05 AM    Final    CT ANGIO HEAD NECK W WO CM (CODE STROKE)  Result Date: 08/03/2023 CLINICAL DATA:  Stroke versus transient ischemic attack. Left-sided extremity weakness, facial tingling and numbness. EXAM: CT ANGIOGRAPHY HEAD AND NECK WITH AND WITHOUT CONTRAST TECHNIQUE: Multidetector CT imaging of the head and neck was performed using the standard protocol during bolus administration of intravenous contrast. Multiplanar CT image reconstructions and MIPs were obtained to evaluate the vascular anatomy. Carotid stenosis measurements (when applicable) are obtained utilizing NASCET criteria, using the distal internal carotid diameter as the denominator. RADIATION DOSE REDUCTION: This exam was performed according to the departmental dose-optimization program which includes automated exposure control, adjustment of the mA and/or kV according to patient size and/or use of iterative reconstruction technique. CONTRAST:  75mL OMNIPAQUE IOHEXOL 350 MG/ML SOLN COMPARISON:  Head CT 08/03/2023. FINDINGS: CTA NECK FINDINGS Aortic arch: Two-vessel arch configuration with common origin of the right brachiocephalic and left common carotid arteries. Arch vessel origins are patent. Right carotid system: No evidence of dissection, stenosis (50% or greater), or occlusion. Left carotid system: No evidence of dissection, stenosis (50% or greater), or occlusion.  Vertebral arteries: Codominant. No evidence of dissection, stenosis (50% or greater), or occlusion. Skeleton: Mild cervical spondylosis without high-grade spinal canal stenosis. Other neck: 1.8 cm right thyroid nodule (axial image 27 series 4). Upper chest: Unremarkable. Review of the MIP images confirms the above findings CTA HEAD FINDINGS Anterior circulation: Intracranial ICAs are patent without stenosis or aneurysm. The proximal ACAs and MCAs are patent without stenosis or aneurysm. Distal branches are symmetric. Posterior circulation: Normal basilar artery. The SCAs, AICAs and PICAs are patent proximally. The PCAs are patent proximally without stenosis or aneurysm. Distal branches are symmetric. Venous sinuses: As permitted by contrast timing, patent. Anatomic variants: Hypoplastic right A1 segment. Review of the MIP images confirms the above findings IMPRESSION: 1. No large vessel occlusion, hemodynamically significant stenosis, or aneurysm in the head or neck. 2. Right thyroid nodule, 1.8 cm. Recommend nonurgent thyroid US (ref: J Am Coll Radiol. 2015 Feb;12(2): 143-50). Electronically Signed   By: Orvan Falconer M.D.   On: 08/03/2023 18:43   CT HEAD CODE STROKE WO CONTRAST`  Result Date: 08/03/2023 CLINICAL DATA:  Code stroke. Stroke versus transient ischemic attack. Left-sided weakness. EXAM: CT HEAD WITHOUT CONTRAST TECHNIQUE: Contiguous axial images were obtained from the base of the skull through the vertex without intravenous contrast. RADIATION DOSE REDUCTION: This exam was performed according to the departmental dose-optimization program which includes automated exposure control, adjustment of the mA and/or kV according to patient size and/or use of iterative reconstruction technique. COMPARISON:  None Available. FINDINGS: Brain: No acute hemorrhage. Cortical gray-white differentiation is preserved. Patchy hypoattenuation of the cerebral white matter, most consistent with mild chronic  small-vessel disease. Prominence of the ventricles and sulci within normal limits for age. No extra-axial collection. Basilar cisterns are patent. Vascular: No hyperdense vessel or unexpected calcification. Skull: No calvarial fracture or suspicious bone lesion. Skull base is unremarkable. Sinuses/Orbits: No acute finding. Other: None. ASPECTS [facility] Stroke Program Early CT Score) - Ganglionic level infarction (caudate, lentiform nuclei, internal capsule, insula, M1-M3 cortex): 7 - Supraganglionic infarction (M4-M6 cortex): 3 Total score (0-10 with 10 being normal): 10 IMPRESSION: 1. No acute intracranial hemorrhage or evidence of acute large vessel territory infarct. ASPECT score is 10. 2. Mild chronic small-vessel disease. Code stroke imaging results were communicated  on 08/03/2023 at 6:23 pm to provider Dr. Elayne Snare via telephone, who verbally acknowledged these results. Electronically Signed   By: Orvan Falconer M.D.   On: 08/03/2023 18:23       HISTORY OF PRESENT ILLNESS [name] is a 72 y.o. female with history of GERD, HLD, asthma presenting initially to [facility] with acute onset of left-sided numbness and incoordination at home.  Symptoms fluctuated while at outside hospital with brief improvement followed by return of symptoms prompting code stroke activation and patient was subsequently given TNKase by teleneurology.  Patient was then transported to [facility] hospital for further neurology evaluation.    HOSPITAL COURSE 9/23: TNK administered 9/23 via teleneurology.  [facility] for further neurology evaluation and stroke workup. 9/24: Patient reports disturbed sleep overnight.  Appears anxious on examination this morning.  Patient reports missing her thyroid medication this morning.  Requires Cleviprex gtt for blood pressure control. 9/24: Methimazole initiated with improvement in heart rate, anxiety.  Metoprolol ordered with improvement in  blood pressure, Cleviprex off.  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 9/25: Patient stabilized for transfer out of the ICU.  Stroke: Acute right thalamic capsular infarct, etiology likely small vessel disease  CT head no acute intracranial hemorrhage or evidence of acute large vessel territory infarct.  Aspect score is 10.  Mild chronic small vessel disease. CTA head & neck no LVO, hemodynamically significant stenosis, or aneurysm in the head or neck. 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  Associated mild petechial blood products without frank hemorrhagic transformation or significant regional mass effect.  Underlying moderately advanced chronic microvascular ischemic disease. 2D Echo LVEF 70 to 75%.  The left ventricle has hyperdynamic function.  Right ventricular systolic function is hyperdynamic. Will need 30-day event monitor as outpt given hyperthyroidism and palpitation  LDL 88 HgbA1c 6.3 VTE prophylaxis - ambulate as tolerated, okay to start DVT prophylaxis No antithrombotic prior to admission, now on aspirin 81 mg daily and clopidogrel 75 mg daily for 3 weeks then aspirin alone Therapy recommendations: OT and PT recommending CIR Disposition: CIR   Hypertension Home meds: None Stable now Metoprolol 25 mg twice daily initiated due to tachycardia and hypertension As needed labetalol and hydralazine ordered Long term BP goal normotensive   Hyperlipidemia Home meds: Simvastatin 10 mg LDL 88, goal < 70 Now on crestor 20mg  p.o. daily Continue statin at discharge   Other Active Problems Hyperthyroidism Continued home Methimazole TSH slightly elevated, T3 and free T4 unremarkable Tachycardia and anxiety significantly improved on cardiac monitor today with rate in the 80s Stated intermittent heart  palpitation at home Recommend 30 day cardiac event monitoring as outpt  DISCHARGE EXAM Blood pressure (!) 151/78, pulse 69, temperature 97.6 F (36.4 C), temperature source Axillary, resp. rate 18, height 5' (1.524 m), weight 62.5 kg, SpO2 100%. PHYSICAL EXAM General:  Alert, well-nourished, well-developed Caucasian female patient in no acute distress Psych:  Mood and affect appropriate for situation CV: regular rate and rhythm on monitor Respiratory:  Regular, unlabored respirations on room air GI: Abdomen soft and nontender   NEURO:  Mental Status: AA&Ox3, patient is able to give clear and coherent history of present illness Speech/Language: speech is without dysarthria or aphasia.     Cranial Nerves:  II: PERRL. Visual fields full.  III, IV, VI: EOMI.  Eyelids elevate symmetrically.  V: Sensation is intact to light touch is intact and diminishedon left face VII: Face is slight asymmetrical on the left VIII: Hearing intact to voice. IX, X: Phonation is normal.  XI: Shoulder shrug 5/5. XII: Tongue protrudes midline Motor: 5/5 strength throughout bilateral upper and lower extremities on confrontational strength testing Tone: is normal and bulk is normal Sensation: light touch mildly decreased on the left upper and lower extremities.   Coordination: significant ataxia on the LUE and LLE with pseudo chorea Gait: Deferred  Discharge Diet      Diet   Diet regular Room service appropriate? Yes with Assist; Fluid consistency: Thin   liquids  DISCHARGE PLAN Disposition:  Transfer to [facility] for ongoing PT, OT and ST aspirin 81 mg daily and clopidogrel 75 mg daily for secondary stroke prevention for 3 weeks then aspirin alone. Recommend ongoing stroke risk factor control by Primary Care Physician at time of discharge from inpatient rehabilitation. Follow-up PCP Sigmund Hazel, MD in 2 weeks following discharge from rehab. Follow-up in [facility] in 4 weeks following discharge from rehab, office to schedule an appointment.   35 minutes were spent preparing discharge.  Valentina Lucks, MSN, NP-C [facility] See AMION or use Epic Chat  ATTENDING NOTE: I reviewed above note and agree with the assessment and plan. Pt was seen and examined.   No acute event overnight and she is medically ready for CIR placement. Continue DAPT and statin. Will need 30 day monitoring as outpt.   For detailed assessment and plan, please refer to above/below as I have made changes wherever appropriate.   Marvel Plan, MD PhD Stroke Neurology 08/10/2023 3:25 PM

## 2023-08-11 ENCOUNTER — Ambulatory Visit (HOSPITAL_COMMUNITY): Payer: PPO

## 2023-08-11 DIAGNOSIS — R7303 Prediabetes: Secondary | ICD-10-CM

## 2023-08-11 DIAGNOSIS — I10 Essential (primary) hypertension: Secondary | ICD-10-CM | POA: Diagnosis not present

## 2023-08-11 DIAGNOSIS — I639 Cerebral infarction, unspecified: Secondary | ICD-10-CM | POA: Diagnosis not present

## 2023-08-11 LAB — CBC WITH DIFFERENTIAL/PLATELET
Abs Immature Granulocytes: 0.05 10*3/uL (ref 0.00–0.07)
Basophils Absolute: 0.1 10*3/uL (ref 0.0–0.1)
Basophils Relative: 1 %
Eosinophils Absolute: 0.6 10*3/uL — ABNORMAL HIGH (ref 0.0–0.5)
Eosinophils Relative: 7 %
HCT: 43.8 % (ref 36.0–46.0)
Hemoglobin: 13.6 g/dL (ref 12.0–15.0)
Immature Granulocytes: 1 %
Lymphocytes Relative: 27 %
Lymphs Abs: 2.2 10*3/uL (ref 0.7–4.0)
MCH: 26.7 pg (ref 26.0–34.0)
MCHC: 31.1 g/dL (ref 30.0–36.0)
MCV: 86.1 fL (ref 80.0–100.0)
Monocytes Absolute: 0.6 10*3/uL (ref 0.1–1.0)
Monocytes Relative: 8 %
Neutro Abs: 4.4 10*3/uL (ref 1.7–7.7)
Neutrophils Relative %: 56 %
Platelets: 248 10*3/uL (ref 150–400)
RBC: 5.09 MIL/uL (ref 3.87–5.11)
RDW: 13.2 % (ref 11.5–15.5)
WBC: 7.9 10*3/uL (ref 4.0–10.5)
nRBC: 0 % (ref 0.0–0.2)

## 2023-08-11 LAB — COMPREHENSIVE METABOLIC PANEL
ALT: 21 U/L (ref 0–44)
AST: 20 U/L (ref 15–41)
Albumin: 3.2 g/dL — ABNORMAL LOW (ref 3.5–5.0)
Alkaline Phosphatase: 75 U/L (ref 38–126)
Anion gap: 6 (ref 5–15)
BUN: 21 mg/dL (ref 8–23)
CO2: 23 mmol/L (ref 22–32)
Calcium: 9.3 mg/dL (ref 8.9–10.3)
Chloride: 110 mmol/L (ref 98–111)
Creatinine, Ser: 0.94 mg/dL (ref 0.44–1.00)
GFR, Estimated: >60 mL/min (ref >60)
Glucose, Bld: 123 mg/dL — ABNORMAL HIGH (ref 70–99)
Potassium: 4.2 mmol/L (ref 3.5–5.1)
Sodium: 139 mmol/L (ref 135–145)
Total Bilirubin: 0.3 mg/dL (ref 0.3–1.2)
Total Protein: 6.3 g/dL — ABNORMAL LOW (ref 6.5–8.1)

## 2023-08-11 NOTE — Progress Notes (Signed)
Inpatient Rehabilitation Center Individual Statement of Services  Patient Name:  Priscilla Wilson  Date:  08/11/2023  Welcome to the Inpatient Rehabilitation Center.  Our goal is to provide you with an individualized program based on your diagnosis and situation, designed to meet your specific needs.  With this comprehensive rehabilitation program, you will be expected to participate in at least 3 hours of rehabilitation therapies Monday-Friday, with modified therapy programming on the weekends.  Your rehabilitation program will include the following services:  Physical Therapy (PT), Occupational Therapy (OT), Speech Therapy (ST), 24 hour per day rehabilitation nursing, Therapeutic Recreaction (TR), Neuropsychology, Care Coordinator, Rehabilitation Medicine, Nutrition Services, Pharmacy Services, and Other  Weekly team conferences will be held on Wednesdays  to discuss your progress.  Your Inpatient Rehabilitation Care Coordinator will talk with you frequently to get your input and to update you on team discussions.  Team conferences with you and your family in attendance may also be held.  Expected length of stay: 10-12 Days  Overall anticipated outcome: supervision to mod I   Depending on your progress and recovery, your program may change. Your Inpatient Rehabilitation Care Coordinator will coordinate services and will keep you informed of any changes. Your Inpatient Rehabilitation Care Coordinator's name and contact numbers are listed  below.  The following services may also be recommended but are not provided by the Inpatient Rehabilitation Center:   Home Health Rehabiltiation Services Outpatient Rehabilitation Services  Arrangements will be made to provide these services after discharge if needed.  Arrangements include referral to agencies that provide these services.  Your insurance has been verified to be:  HTA Your primary doctor is:  Sigmund Hazel, MD  Pertinent information will be  shared with your doctor and your insurance company.  Inpatient Rehabilitation Care Coordinator:  Lavera Guise, [place] [phone] or [phone]  Information discussed with and copy given to patient by: Andria Rhein, 08/11/2023, 11:46 AM

## 2023-08-11 NOTE — Progress Notes (Signed)
Physical Therapy Session Note  Patient Details  Name: Priscilla Wilson MRN: [id number] Date of Birth: Oct 23, 1951  Today's Date: 08/11/2023 PT Individual Time: 1302-1415 PT Individual Time Calculation (min): 73 min   Short Term Goals: Week 1:  PT Short Term Goal 1 (Week 1): Pt will perform functional standing transfers with light CGA using LRAD. PT Short Term Goal 2 (Week 1): Pt will ambulate community distances using LRAD with consistent supervision and min vc for technique. PT Short Term Goal 3 (Week 1): Pt will complete appropriate outcome measures.  Skilled Therapeutic Interventions/Progress Updates:  Patient seated upright in recliner on entrance to room. Patient alert and agreeable to PT session.   Patient with no pain complaint at start of session.  Therapeutic Activity: Transfers: Pt performed sit<>stand and stand pivot transfers throughout session with CGA/ light MinA d/t fatigue. Provided verbal cues for improved technique including hand positioning.  Gait Training:  Pt taken outside to patio entrance of [facility]. Pt guided in quick visual tour of hospital. She was able to ambulate 120' x2  using RW with generally CGA and up to [facility] for balance loss d/t fatigue. Following seated rest break, pt guided in ambulation up/ down 100' of walkway around circular drive to focus on increased effort to clear feet and maintain control of RW. Throughout all amb bouts, pt provided with vc for increasing step height. Pt is able to correct but not without increased step length as well. With conscious focus to intent of step and decreasing length, pt is actually able to decrease step length. One instance of increased step length backwards to pivot step to sit in w/c without adjusting RW  and with arms outstretched. Requires ModA for balance and correcting body position.  NMR performed for improvements in motor control and coordination, balance, sequencing, judgement, and self confidence/ efficacy in  performing all aspects of mobility at highest level of independence.   Back in room, pt guided in ambulation from doorway to recliner with no AD and Min/ ModA for balance during amb with HHA to LUE.   Patient seated upright with BLE elevated at end of session with brakes locked, belt alarm set, and all needs within reach.   Therapy Documentation Precautions:  Precautions Precautions: Fall Precaution Comments: L side hemipareisis with reduced coordination Restrictions Weight Bearing Restrictions: No General:   Vital Signs: Therapy Vitals Temp: 98.6 F (37 C) Temp Source: Oral Pulse Rate: 64 Resp: 16 BP: (!) 143/69 Patient Position (if appropriate): Lying Oxygen Therapy SpO2: 99 % O2 Device: Room Air Pain:   Mobility:   Locomotion :    Trunk/Postural Assessment :    Balance:   Exercises:   Other Treatments:      Therapy/Group: Individual Therapy  Loel Dubonnet PT, DPT, CSRS 08/11/2023, 6:07 PM

## 2023-08-11 NOTE — Evaluation (Signed)
Physical Therapy Assessment and Plan  Patient Details  Name: Priscilla Wilson MRN: [id number] Date of Birth: 02-Jun-1951  PT Diagnosis: Ataxia, Difficulty walking, Hemiparesis non-dominant, and Muscle weakness Rehab Potential: Good ELOS: 9-12 days   Today's Date: 08/11/2023 PT Individual Time: 0905-1002 PT Individual Time Calculation (min): 57 min    Hospital Problem: Principal Problem:   Acute CVA (cerebrovascular accident) [facility])   Past Medical History: History reviewed. No pertinent past medical history. Past Surgical History:  Past Surgical History:  Procedure Laterality Date   BREAST EXCISIONAL BIOPSY Left    benign    Assessment & Plan Clinical Impression: Patient is a 72 y.o. female who presented to the [facility] ED on 08/03/2023 with sudden onset of left-sided numbness and weakness. She was emergently evaluated by teleneurology and she was given TNKase. PMH significant for hyperthyroidism on methimazole, asthma, hyperlipidemia, GERD. Methimazole initiated with improvement in heart rate, anxiety. Metoprolol ordered with improvement in blood pressure, Cleviprex off. 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 2D echo LVEF 70-75%. Hgb A1C 6.3%. Now on aspirin 81 mg daily and clopidogrel 75 mg daily for 3 weeks then aspirin alone. Recommend 30 day cardiac event monitoring. Patient continues to need up to mod A to navigate rolling walker when turning and max cues for placement of LLE. Increased cues needed for awareness of LUE with bed mobility and functional mobility. The patient requires inpatient medicine and rehabilitation evaluations and services for ongoing dysfunction secondary to right thalamic capsular infarct. .  Patient transferred to [facility] on 08/10/2023 .   Patient currently requires min assist with mobility secondary to muscle  weakness, decreased cardiorespiratoy endurance, impaired timing and sequencing, unbalanced muscle activation, ataxia, decreased coordination, and decreased motor planning, decreased attention to left, and decreased standing balance and decreased balance strategies.  Prior to hospitalization, patient was independent  with mobility and lived with Spouse in a House home.  Home access is 4Stairs to enter.  Patient will benefit from skilled PT intervention to maximize safe functional mobility, minimize fall risk, and decrease caregiver burden for planned discharge home with 24 hour assist.  Anticipate patient will benefit from follow up OP at discharge.  PT - End of Session Activity Tolerance: Tolerates 30+ min activity with multiple rests Endurance Deficit: Yes (Simultaneous filing. User may not have seen previous data.) PT Assessment Rehab Potential (ACUTE/IP ONLY): Good PT Barriers to Discharge: Inaccessible home environment;Decreased caregiver support;Insurance for SNF coverage PT Patient demonstrates impairments in the following area(s): Balance;Endurance;Motor;Perception;Safety PT Transfers Functional Problem(s): Bed Mobility;Bed to Chair;Car;Furniture PT Locomotion Functional Problem(s): Ambulation;Stairs PT Plan PT Intensity: Minimum of 1-2 x/day ,45 to 90 minutes PT Frequency: 5 out of 7 days PT Treatment/Interventions: Ambulation/gait training;Cognitive remediation/compensation;Discharge planning;DME/adaptive equipment instruction;Functional mobility training;Pain management;Psychosocial support;Splinting/orthotics;Therapeutic Activities;UE/LE Strength taining/ROM;Visual/perceptual remediation/compensation;Balance/vestibular training;Community reintegration;Disease management/prevention;Neuromuscular re-education;Patient/family education;Stair training;Therapeutic Exercise;UE/LE Coordination activities PT Transfers Anticipated Outcome(s): Mod I PT Locomotion Anticipated Outcome(s): Mod I PT  Recommendation Recommendations for Other Services: Therapeutic Recreation consult Therapeutic Recreation Interventions: Warehouse manager;Outing/community reintergration Follow Up Recommendations: Outpatient PT;24 hour supervision/assistance Patient destination: Home   PT Evaluation Precautions/Restrictions Precautions Precautions: Fall Precaution Comments: L side hemipareisis with reduced coordination Restrictions Weight Bearing Restrictions: No  Pain Pain Assessment Pain Scale: 0-10 Pain Score: 0-No pain Pain Interference Pain Interference Pain Effect on Sleep: 0. Does not apply - I have not had any pain or hurting in the past 5 days Pain  Interference with Therapy Activities: 0. Does not apply - I have not received rehabilitationtherapy in the past 5 days Pain Interference with Day-to-Day Activities: 1. Rarely or not at all Home Living/Prior Functioning Home Living Available Help at Discharge: Family;Available 24 hours/day Type of Home: House Home Access: Stairs to enter [employer] of Steps: 4 Entrance Stairs-Rails: Left;Right Home Layout: Two level;Full bath on main level;Able to live on main level with bedroom/bathroom Alternate Level Stairs-Number of Steps: 14 Bathroom Shower/Tub: Health visitor: Handicapped height Bathroom Accessibility: Yes  Lives With: Spouse Prior Function Level of Independence: Independent with basic ADLs;Independent with homemaking with ambulation;Independent with gait;Independent with transfers  Able to Take Stairs?: Yes Driving: Yes Vocation: Retired Leisure: Hobbies-yes (Comment) (enjoys line dancing) Vision/Perception  Vision - History Ability to See in Adequate Light: 0 Adequate Perception Perception: Within Functional Limits Praxis Praxis: Impaired Praxis Impairment Details: Limb apraxia  Cognition Overall Cognitive Status: Within Functional Limits for tasks assessed Arousal/Alertness:  Awake/alert Orientation Level: Oriented X4 Memory: Appears intact Awareness: Appears intact Problem Solving: Appears intact Safety/Judgment: Appears intact Sensation Sensation Light Touch: Appears Intact Hot/Cold: Appears Intact Proprioception: Impaired by gross assessment Stereognosis: Impaired by gross assessment Coordination Gross Motor Movements are Fluid and Coordinated: No Fine Motor Movements are Fluid and Coordinated: No Coordination and Movement Description: left side impaired due to limited sensation, frequent drops items from left hand or knocks items over Finger Nose Finger Test: left side dysmetric due to impaired sensation Motor  Motor Motor: Ataxia;Other (comment) Motor - Skilled Clinical Observations: mild L  hemipareisis with ataxia   Trunk/Postural Assessment  Cervical Assessment Cervical Assessment: Within Functional Limits Thoracic Assessment Thoracic Assessment: Within Functional Limits Lumbar Assessment Lumbar Assessment: Within Functional Limits Postural Control Postural Control: Within Functional Limits  Balance Balance Balance Assessed: Yes Static Sitting Balance Static Sitting - Balance Support: No upper extremity supported;Feet supported Static Sitting - Level of Assistance: 5: Stand by assistance;6: Modified independent (Device/Increase time) Dynamic Sitting Balance Dynamic Sitting - Balance Support: No upper extremity supported;Feet supported Dynamic Sitting - Level of Assistance: 5: Stand by assistance Static Standing Balance Static Standing - Balance Support: No upper extremity supported Static Standing - Level of Assistance: Other (comment) (CGA) Dynamic Standing Balance Dynamic Standing - Balance Support: Bilateral upper extremity supported;During functional activity Dynamic Standing - Level of Assistance: 4: Min assist (CGA) Extremity Assessment  RUE Assessment RUE Assessment: Within Functional Limits LUE Assessment LUE Assessment:   (using L hand as an active assist) Active Range of Motion (AROM) Comments: WFL General Strength Comments: sh flexion 4-/5, grasp 4-/5 RLE Assessment RLE Assessment: Within Functional Limits LLE Assessment LLE Assessment: Exceptions to [facility] LLE Strength LLE Overall Strength: Deficits Left Hip Flexion: 4/5 Left Hip Extension: 4-/5 Left Hip ABduction: 4/5 Left Hip ADduction: 4-/5 Left Knee Flexion: 4/5 Left Knee Extension: 4+/5 Left Ankle Dorsiflexion: 4/5 Left Ankle Plantar Flexion: 4+/5  Care Tool Care Tool Bed Mobility Roll left and right activity   Roll left and right assist level: Supervision/Verbal cueing    Sit to lying activity   Sit to lying assist level: Supervision/Verbal cueing    Lying to sitting on side of bed activity   Lying to sitting on side of bed assist level: the ability to move from lying on the back to sitting on the side of the bed with no back support.: Supervision/Verbal cueing     Care Tool Transfers Sit to stand transfer   Sit to stand assist level: Supervision/Verbal cueing  Chair/bed transfer   Chair/bed transfer assist level: Contact Guard/Touching assist     Toilet transfer   Assist Level: Architect transfer assist level: Contact Guard/Touching assist      Care Tool Locomotion Ambulation   Assist level: Minimal Assistance - Patient > 75%   Max distance: 200 ft  Walk 10 feet activity   Assist level: Supervision/Verbal cueing Assistive device: Walker-rolling   Walk 50 feet with 2 turns activity   Assist level: Contact Guard/Touching assist    Walk 150 feet activity   Assist level: Contact Guard/Touching assist Assistive device: Walker-rolling  Walk 10 feet on uneven surfaces activity Walk 10 feet on uneven surfaces activity did not occur: Safety/medical concerns      Stairs   Assist level: Contact Guard/Touching assist Stairs assistive device: 2 hand rails Max number of stairs: 8   Walk up/down 1 step activity   Walk up/down 1 step (curb) assist level: Minimal Assistance - Patient > 75% Walk up/down 1 step or curb assistive device: 2 hand rails  Walk up/down 4 steps activity   Walk up/down 4 steps assist level: Contact Guard/Touching assist Walk up/down 4 steps assistive device: 2 hand rails  Walk up/down 12 steps activity Walk up/down 12 steps activity did not occur: Safety/medical concerns      Pick up small objects from floor   Pick up small object from the floor assist level: Moderate Assistance - Patient 50 - 74%    Wheelchair Is the patient using a wheelchair?: No Type of Wheelchair: Manual   Wheelchair assist level: Dependent - Patient 0% Max wheelchair distance: 200 ft  Wheel 50 feet with 2 turns activity   Assist Level: Dependent - Patient 0%  Wheel 150 feet activity   Assist Level: Dependent - Patient 0%    Refer to Care Plan for Long Term Goals  SHORT TERM GOAL WEEK 1 PT Short Term Goal 1 (Week 1): Pt will perform functional standing transfers with light CGA using LRAD. PT Short Term Goal 2 (Week 1): Pt will ambulate community distances using LRAD with consistent supervision and min vc for technique. PT Short Term Goal 3 (Week 1): Pt will complete appropriate outcome measures.  Recommendations for other services: Adult nurse group, Stress management, and Outing/community reintegration  Skilled Therapeutic Intervention Mobility Bed Mobility Bed Mobility: Supine to Sit;Sit to Supine Supine to Sit: Supervision/Verbal cueing Sit to Supine: Supervision/Verbal cueing Transfers Transfers: Sit to Stand;Stand to Sit;Stand Pivot Transfers Sit to Stand: Contact Guard/Touching assist Stand to Sit: Contact Guard/Touching assist Stand Pivot Transfers: Minimal Assistance - Patient > 75%;Contact Guard/Touching assist Transfer (Assistive device): Rolling walker Locomotion  Gait Ambulation: Yes Gait Assistance: Minimal Assistance -  Patient > 75%;Contact Guard/Touching assist Gait Distance (Feet): 200 Feet Assistive device: Rolling walker Gait Gait: Yes Gait Pattern: Ataxic;Poor foot clearance - left;Decreased weight shift to left;Decreased stance time - right;Decreased step length - left;Decreased step length - right Gait velocity: decreased Stairs / Additional Locomotion Stairs: Yes Stairs Assistance: Minimal Assistance - Patient > 75%;Contact Guard/Touching assist Stair Management Technique: Two rails Number of Stairs: 8 Height of Stairs: 6 Wheelchair Mobility Wheelchair Mobility: No  Skilled Intervention: PT Evaluation completed; see above for results. PT educated patient in roles of PT vs OT, PT POC, rehab potential, rehab goals, and discharge recommendations along with recommendation for follow-up rehabilitation services. Individual treatment initiated:  Patient seated upright in bed upon PT arrival. Patient alert and  agreeable to PT session. No pain complaint at start of session.  Therapeutic Activity: Bed Mobility: Patient performed supine to/from sit with supervision and no need for cueing on technique.   Transfers: Patient performed sit <> stand transfers throughout session with CGA/ supervision using RW. Provided vc/tc for improving technique in balance shift and hand positioning/ progression. Stand pivot requires CGA using RW and cues for L foot placement in pivot stepping as well as attn to L hand on walker hand grip. Pt demos difficulty with "zeroing in" to proper hand placement. Toilet transfer with CGA and similar quality of transfer.  Gait Training:  Patient ambulated 200 feet using RW with CGA. Demonstrated mild ataxia/ dyscoordination in L foot placement with adequate step height. Provided vc/tc for L step length and proximity to walker as pt tending to hold walker close to her.  Stair negotiation initiated with pt using BHR throughout and provided with vc for leading LE for ascent/ descent. Pt is  able to perform correctly with all vc provided and completes with CGA during 1st bout of 4steps, and then intermittent MinA during descent of 2nd bout d/t fatigue.   Patient seated upright in recliner with BLE elevated at end of session with brakes locked, belt alarm set, and all needs within reach.   Discharge Criteria: Patient will be discharged from PT if patient refuses treatment 3 consecutive times without medical reason, if treatment goals not met, if there is a change in medical status, if patient makes no progress towards goals or if patient is discharged from hospital.  The above assessment, treatment plan, treatment alternatives and goals were discussed and mutually agreed upon: by patient  Loel Dubonnet PT, DPT, CSRS 08/11/2023, 1:04 PM

## 2023-08-11 NOTE — Evaluation (Signed)
Occupational Therapy Assessment and Plan  Patient Details  Name: Priscilla Wilson MRN: [id number] Date of Birth: 1951/07/10  OT Diagnosis: ataxia and hemiplegia affecting non-dominant side Rehab Potential: Rehab Potential (ACUTE ONLY): Excellent ELOS: 10 days   Today's Date: 08/11/2023 OT Individual Time: 1050-1206 OT Individual Time Calculation (min): 76 min     Hospital Problem: Principal Problem:   Acute CVA (cerebrovascular accident) [facility])   Past Medical History: History reviewed. No pertinent past medical history. Past Surgical History:  Past Surgical History:  Procedure Laterality Date   BREAST EXCISIONAL BIOPSY Left    benign    Assessment & Plan Clinical Impression:   Priscilla Wilson is a 72 year old female who presented to the [facility] ED on 08/03/2023 with sudden onset of left-sided numbness and weakness. She was emergently evaluated by teleneurology and she was given TNKase. PMH significant for hyperthyroidism on methimazole, asthma, hyperlipidemia, GERD. Methimazole initiated with improvement in heart rate, anxiety. Metoprolol ordered with improvement in blood pressure, Cleviprex off. 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 2D echo LVEF 70-75%. Hgb A1C 6.3%. Now on aspirin 81 mg daily and clopidogrel 75 mg daily for 3 weeks then aspirin alone. Recommend 30 day cardiac event monitoring. Patient continues to need up to mod A to navigate rolling walker when turning and max cues for placement of LLE. Increased cues needed for awareness of LUE with bed mobility and functional mobility. The patient requires inpatient medicine and rehabilitation evaluations and services for ongoing dysfunction secondary to right thalamic capsular infarct.  Patient transferred to CIR on 08/10/2023 .    Patient currently requires supervision with basic  self-care skills secondary to ataxia and decreased coordination and decreased balance strategies.  Prior to hospitalization, patient was fully independent and active.   Patient will benefit from skilled intervention to increase independence with basic self-care skills and increase level of independence with iADL prior to discharge home with care partner.  Anticipate patient will require intermittent supervision and follow up outpatient.  OT - End of Session Endurance Deficit: Yes (Simultaneous filing. User may not have seen previous data.) OT Assessment Rehab Potential (ACUTE ONLY): Excellent OT Patient demonstrates impairments in the following area(s): Balance;Motor OT Basic ADL's Functional Problem(s): Dressing;Toileting;Bathing OT Advanced ADL's Functional Problem(s): Simple Meal Preparation;Laundry;Light Housekeeping OT Transfers Functional Problem(s): Toilet;Tub/Shower OT Additional Impairment(s): Fuctional Use of Upper Extremity OT Plan OT Intensity: Minimum of 1-2 x/day, 45 to 90 minutes OT Frequency: 5 out of 7 days OT Duration/Estimated Length of Stay: 10 days OT Treatment/Interventions: Balance/vestibular training;DME/adaptive equipment instruction;Disease mangement/prevention;Discharge planning;Functional mobility training;Neuromuscular re-education;Patient/family education;Psychosocial support;Self Care/advanced ADL retraining;Therapeutic Activities;Therapeutic Exercise;UE/LE Strength taining/ROM;UE/LE Coordination activities OT Self Feeding Anticipated Outcome(s): independent OT Basic Self-Care Anticipated Outcome(s): Mod independent OT Toileting Anticipated Outcome(s): Mod Independent OT Bathroom Transfers Anticipated Outcome(s): Mod independent OT Recommendation Patient destination: Home Follow Up Recommendations: Outpatient OT Equipment Recommended: To be determined   OT Evaluation Precautions/Restrictions  Precautions Precautions: Fall Precaution Comments: L side  hemipareisis with reduced coordination Restrictions Weight Bearing Restrictions: No    Pain Pain Assessment Pain Scale: 0-10 Pain Score: 0-No pain Home Living/Prior Functioning Home Living Family/patient expects to be discharged to:: Private residence Living Arrangements: Spouse/significant other Available Help at Discharge: Family, Available 24 hours/day Type of Home: House Home Access: Stairs to enter [employer] of Steps: 4 Entrance Stairs-Rails: Left, Right Home Layout: Two level, Full bath on main level,  Able to live on main level with bedroom/bathroom Alternate Level Stairs-Number of Steps: 14 Bathroom Shower/Tub: Health visitor: Handicapped height Bathroom Accessibility: Yes  Lives With: Spouse Prior Function Level of Independence: Independent with basic ADLs, Independent with homemaking with ambulation, Independent with gait, Independent with transfers  Able to Take Stairs?: Yes Driving: Yes Vocation: Retired Leisure: Hobbies-yes (Comment) (enjoys line dancing) Vision Baseline Vision/History: 1 Wears glasses Ability to See in Adequate Light: 0 Adequate Patient Visual Report: No change from baseline Vision Assessment?: No apparent visual deficits Perception  Perception: Within Functional Limits Praxis Praxis: Impaired Praxis Impairment Details: Limb apraxia Cognition Cognition Overall Cognitive Status: Within Functional Limits for tasks assessed Arousal/Alertness: Awake/alert Orientation Level: Person;Place;Situation Person: Oriented Place: Oriented Situation: Oriented Memory: Appears intact Awareness: Appears intact Problem Solving: Appears intact Safety/Judgment: Appears intact Brief Interview for Mental Status (BIMS) Repetition of Three Words (First Attempt): 3 Temporal Orientation: Year: Correct Temporal Orientation: Month: Accurate within 5 days Temporal Orientation: Day: Correct Recall: "Sock": Yes, no cue  required Recall: "Blue": Yes, no cue required Recall: "Bed": Yes, no cue required BIMS Summary Score: 15 Sensation Sensation Light Touch: Appears Intact Hot/Cold: Appears Intact Proprioception: Impaired by gross assessment Stereognosis: Impaired by gross assessment Coordination Gross Motor Movements are Fluid and Coordinated: No Fine Motor Movements are Fluid and Coordinated: No Coordination and Movement Description: left side impaired due to limited sensation, frequent drops items from left hand or knocks items over Finger Nose Finger Test: left side dysmetric due to impaired sensation Motor  Motor Motor: Ataxia;Other (comment) Motor - Skilled Clinical Observations: mild L  hemipareisis with ataxia  Trunk/Postural Assessment  Cervical Assessment Cervical Assessment: Within Functional Limits Thoracic Assessment Thoracic Assessment: Within Functional Limits Lumbar Assessment Lumbar Assessment: Within Functional Limits Postural Control Postural Control: Within Functional Limits  Balance Balance Balance Assessed: Yes Static Sitting Balance Static Sitting - Balance Support: No upper extremity supported;Feet supported Static Sitting - Level of Assistance: 5: Stand by assistance;6: Modified independent (Device/Increase time) Dynamic Sitting Balance Dynamic Sitting - Balance Support: No upper extremity supported;Feet supported Dynamic Sitting - Level of Assistance: 5: Stand by assistance Static Standing Balance Static Standing - Balance Support: No upper extremity supported Static Standing - Level of Assistance: Other (comment) (CGA) Dynamic Standing Balance Dynamic Standing - Balance Support: Bilateral upper extremity supported;During functional activity Dynamic Standing - Level of Assistance: 4: Min assist (CGA) Extremity/Trunk Assessment RUE Assessment RUE Assessment: Within Functional Limits LUE Assessment LUE Assessment:  (using L hand as an active assist) Active Range  of Motion (AROM) Comments: WFL General Strength Comments: sh flexion 4-/5, grasp 4-/5  Care Tool Care Tool Self Care Eating   Eating Assist Level: Set up assist    Oral Care    Oral Care Assist Level: Set up assist    Bathing   Body parts bathed by patient: Right arm;Left arm;Chest;Abdomen;Front perineal area;Buttocks;Right upper leg;Face;Left lower leg;Right lower leg;Left upper leg     Assist Level: Supervision/Verbal cueing    Upper Body Dressing(including orthotics)   What is the patient wearing?: Bra;Pull over shirt   Assist Level: Minimal Assistance - Patient > 75%    Lower Body Dressing (excluding footwear)   What is the patient wearing?: Underwear/pull up;Pants Assist for lower body dressing: Supervision/Verbal cueing    Putting on/Taking off footwear   What is the patient wearing?: Shoes;Socks Assist for footwear: Supervision/Verbal cueing       Care Tool Toileting Toileting activity   Assist for toileting: Supervision/Verbal cueing  Care Tool Bed Mobility Roll left and right activity   Roll left and right assist level: Supervision/Verbal cueing    Sit to lying activity   Sit to lying assist level: Supervision/Verbal cueing    Lying to sitting on side of bed activity   Lying to sitting on side of bed assist level: the ability to move from lying on the back to sitting on the side of the bed with no back support.: Supervision/Verbal cueing     Care Tool Transfers Sit to stand transfer   Sit to stand assist level: Supervision/Verbal cueing    Chair/bed transfer   Chair/bed transfer assist level: Contact Guard/Touching assist     Toilet transfer   Assist Level: Contact Guard/Touching assist     Care Tool Cognition  Expression of Ideas and Wants Expression of Ideas and Wants: 4. Without difficulty (complex and basic) - expresses complex messages without difficulty and with speech that is clear and easy to understand  Understanding Verbal and  Non-Verbal Content Understanding Verbal and Non-Verbal Content: 4. Understands (complex and basic) - clear comprehension without cues or repetitions   Memory/Recall Ability Memory/Recall Ability : Current season;Location of own room;Staff names and faces;That he or she is in a hospital/hospital unit   Refer to Care Plan for Long Term Goals  SHORT TERM GOAL WEEK 1 OT Short Term Goal 1 (Week 1): Pt will demonstrate improved L hand coordination to fasten and unfasten her bra. OT Short Term Goal 2 (Week 1): Pt will be able to use L hand to hold fork when cutting meat with R hand. OT Short Term Goal 3 (Week 1): pt will be able to use L hand to place food items in kitchen pantry.  Recommendations for other services: Adult nurse group and Outing/community reintegration   Skilled Therapeutic Intervention ADL ADL ADL Comments: supervision overall except min A to fasten bra and CGA with ADL transfers Mobility  Bed Mobility Bed Mobility: Supine to Sit;Sit to Supine Supine to Sit: Supervision/Verbal cueing Sit to Supine: Supervision/Verbal cueing Transfers Sit to Stand: Contact Guard/Touching assist Stand to Sit: Contact Guard/Touching assist   Pt seen for initial evaluation and ADL training with a focus on L side coordination and balance.  Pt completed oral care standing at sink, toileting, shower and dressing.   Excellent participation and motivation.  Provided pt with med soft theraputty for L hand and finger strengthening.  Reviewed role of OT, discussed POC and pt's goals, and ELOS. Pt resting on bed With all needs met and bed alarm set.      Discharge Criteria: Patient will be discharged from OT if patient refuses treatment 3 consecutive times without medical reason, if treatment goals not met, if there is a change in medical status, if patient makes no progress towards goals or if patient is discharged from hospital.  The above assessment, treatment plan, treatment  alternatives and goals were discussed and mutually agreed upon: by patient  [facility] 08/11/2023, 1:07 PM

## 2023-08-11 NOTE — Progress Notes (Signed)
Inpatient Rehabilitation  Patient information reviewed and entered into eRehab system by Demarrio Menges Sheritta Deeg, OTR/L, Rehab Quality Coordinator.   Information including medical coding, functional ability and quality indicators will be reviewed and updated through discharge.   

## 2023-08-11 NOTE — Plan of Care (Signed)
  Problem: RH Balance Goal: LTG Patient will maintain dynamic standing with ADLs (OT) Description: LTG:  Patient will maintain dynamic standing balance with assist during activities of daily living (OT)  Flowsheets (Taken 08/11/2023 1311) LTG: Pt will maintain dynamic standing balance during ADLs with: Independent with assistive device   Problem: RH Bathing Goal: LTG Patient will bathe all body parts with assist levels (OT) Description: LTG: Patient will bathe all body parts with assist levels (OT) Flowsheets (Taken 08/11/2023 1311) LTG: Pt will perform bathing with assistance level/cueing: Independent with assistive device    Problem: RH Dressing Goal: LTG Patient will perform upper body dressing (OT) Description: LTG Patient will perform upper body dressing with assist, with/without cues (OT). Flowsheets (Taken 08/11/2023 1311) LTG: Pt will perform upper body dressing with assistance level of: Independent Goal: LTG Patient will perform lower body dressing w/assist (OT) Description: LTG: Patient will perform lower body dressing with assist, with/without cues in positioning using equipment (OT) Flowsheets (Taken 08/11/2023 1311) LTG: Pt will perform lower body dressing with assistance level of: Independent with assistive device   Problem: RH Toileting Goal: LTG Patient will perform toileting task (3/3 steps) with assistance level (OT) Description: LTG: Patient will perform toileting task (3/3 steps) with assistance level (OT)  Flowsheets (Taken 08/11/2023 1311) LTG: Pt will perform toileting task (3/3 steps) with assistance level: Independent with assistive device   Problem: RH Functional Use of Upper Extremity Goal: LTG Patient will use RT/LT upper extremity as a (OT) Description: LTG: Patient will use right/left upper extremity as a stabilizer/gross assist/diminished/nondominant/dominant level with assist, with/without cues during functional activity (OT) Flowsheets (Taken 08/11/2023  1311) LTG: Use of upper extremity in functional activities: LUE as diminished level LTG: Pt will use upper extremity in functional activity with assistance level of: Independent   Problem: RH Simple Meal Prep Goal: LTG Patient will perform simple meal prep w/assist (OT) Description: LTG: Patient will perform simple meal prep with assistance, with/without cues (OT). Flowsheets (Taken 08/11/2023 1311) LTG: Pt will perform simple meal prep with assistance level of: Supervision/Verbal cueing   Problem: RH Laundry Goal: LTG Patient will perform laundry w/assist, cues (OT) Description: LTG: Patient will perform laundry with assistance, with/without cues (OT). Flowsheets (Taken 08/11/2023 1311) LTG: Pt will perform laundry with assistance level of: Supervision/Verbal cueing   Problem: RH Light Housekeeping Goal: LTG Patient will perform light housekeeping w/assist (OT) Description: LTG: Patient will perform light housekeeping with assistance, with/without cues (OT). Flowsheets (Taken 08/11/2023 1311) LTG: Pt will perform light housekeeping with assistance level of: Supervision/Verbal cueing   Problem: RH Toilet Transfers Goal: LTG Patient will perform toilet transfers w/assist (OT) Description: LTG: Patient will perform toilet transfers with assist, with/without cues using equipment (OT) Flowsheets (Taken 08/11/2023 1311) LTG: Pt will perform toilet transfers with assistance level of: Independent with assistive device   Problem: RH Tub/Shower Transfers Goal: LTG Patient will perform tub/shower transfers w/assist (OT) Description: LTG: Patient will perform tub/shower transfers with assist, with/without cues using equipment (OT) Flowsheets (Taken 08/11/2023 1311) LTG: Pt will perform tub/shower stall transfers with assistance level of: Independent with assistive device LTG: Pt will perform tub/shower transfers from: Walk in shower

## 2023-08-11 NOTE — Progress Notes (Signed)
PROGRESS NOTE   Subjective/Complaints:  No issues overnite , numbness in lower face improving, also has numbness LUE>LLE    ROS- neg CP, SOB, N/V/D Objective:   No results found. Recent Labs    08/10/23 0746 08/11/23 0621  WBC 8.4 7.9  HGB 13.8 13.6  HCT 43.3 43.8  PLT 246 248   Recent Labs    08/10/23 0746 08/11/23 0621  NA 138 139  K 4.2 4.2  CL 107 110  CO2 20* 23  GLUCOSE 113* 123*  BUN 21 21  CREATININE 0.93 0.94  CALCIUM 9.0 9.3    Intake/Output Summary (Last 24 hours) at 08/11/2023 0757 Last data filed at 08/11/2023 0754 Gross per 24 hour  Intake 660 ml  Output --  Net 660 ml        Physical Exam: Vital Signs Blood pressure 128/87, pulse 63, temperature 97.7 F (36.5 C), resp. rate 17, height 5' (1.524 m), weight 62.7 kg, SpO2 99%.   General: No acute distress Mood and affect are appropriate Heart: Regular rate and rhythm no rubs murmurs or extra sounds Lungs: Clear to auscultation, breathing unlabored, no rales or wheezes Abdomen: Positive bowel sounds, soft nontender to palpation, nondistended Extremities: No clubbing, cyanosis, or edema Skin: No evidence of breakdown, no evidence of rash Neurologic: Cranial nerves II through XII intact, motor strength is 5/5 in bilateral deltoid, bicep, tricep, grip, hip flexor, knee extensors, ankle dorsiflexor and plantar flexor Sensory exam normal sensation to light touch and proprioception in left  upper  Cerebellar exam mod dysmetria Left finger to nose to finger Musculoskeletal: Full range of motion in all 4 extremities. No joint swelling   Assessment/Plan: 1. Functional deficits which require 3+ hours per day of interdisciplinary therapy in a comprehensive inpatient rehab setting. Physiatrist is providing close team supervision and 24 hour management of active medical problems listed below. Physiatrist and rehab team continue to assess barriers to  discharge/monitor patient progress toward functional and medical goals  Care Tool:  Bathing              Bathing assist       Upper Body Dressing/Undressing Upper body dressing        Upper body assist      Lower Body Dressing/Undressing Lower body dressing            Lower body assist       Toileting Toileting    Toileting assist Assist for toileting: Total Assistance - Patient < 25%     Transfers Chair/bed transfer  Transfers assist     Chair/bed transfer assist level: Moderate Assistance - Patient 50 - 74%     Locomotion Ambulation   Ambulation assist              Walk 10 feet activity   Assist           Walk 50 feet activity   Assist           Walk 150 feet activity   Assist           Walk 10 feet on uneven surface  activity   Assist  Wheelchair     Assist               Wheelchair 50 feet with 2 turns activity    Assist            Wheelchair 150 feet activity     Assist          Blood pressure 128/87, pulse 63, temperature 97.7 F (36.5 C), resp. rate 17, height 5' (1.524 m), weight 62.7 kg, SpO2 99%.  Medical Problem List and Plan: 1. 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  Antithrombotics: -DVT/anticoagulation:  Pharmaceutical: Lovenox>start             -antiplatelet therapy: Aspirin and Plavix for three weeks followed by aspirin alone (stop Plavix 10/17)   3. Pain Management: Tylenol as needed   4. Mood/Behavior/Sleep: LCSW to evaluate and provide emotional support             -pt appears positive and up beat. Wants to regain independence!             -continue Effexor-XR 75 mg q AM             -antipsychotic agents: n/a   5. Neuropsych/cognition: This patient is capable  of making decisions on her own behalf.   6. Skin/Wound Care: Routine skin care checks   7. Fluids/Electrolytes/Nutrition: Routine Is and Os and follow-up chemistries   8: Hypertension: monitor TID and prn             -continue Lopressor 25 mg daily   Vitals:   08/10/23 1957 08/11/23 0515  BP: (!) 147/74 128/87  Pulse: 64 63  Resp: 17 17  Temp: 97.8 F (36.6 C) 97.7 F (36.5 C)  SpO2: 100% 99%  Controlled 10/1  9: Hyperlipidemia: continue statin   10: GERD: continue home famotidine   11: Hyperthyroidism with palpitations: continue methimazole and BB   12: History of asthma: continue albuterol MDI as needed   13: Prediabetes: carb modified diet              -sugars have been well controlled. Probably can dc cbg's within the next 24 hours if they remain stable  CBG (last 3)  Recent Labs    08/10/23 0634 08/10/23 1235 08/10/23 1644  GLUCAP 111* 124* 100*   May d/c 10/1        LOS: 1 days A FACE TO FACE EVALUATION WAS PERFORMED  Erick Colace 08/11/2023, 7:57 AM

## 2023-08-11 NOTE — Progress Notes (Addendum)
Inpatient Rehabilitation Admission Medication Review by a Pharmacist  A complete drug regimen review was completed for this patient to identify any potential clinically significant medication issues.  High Risk Drug Classes Is patient taking? Indication by Medication  Antipsychotic No   Anticoagulant Yes Lovenox- vte ppx  Antibiotic No   Opioid No   Antiplatelet Yes Aspirin / plavix- cva ppx x3 weeks f/b aspirin alone End date of Plavix: 08/25/2023  Hypoglycemics/insulin No   Vasoactive Medication Yes Lopressor- HTN  Chemotherapy No   Other Yes Robaxin- muscle spasms Melatonin- sleep Pepcid- GERD Methimazole- hyperthyroidism Crestor- HLD Effexor-XR- MDD     Type of Medication Issue Identified Description of Issue Recommendation(s)  Drug Interaction(s) (clinically significant)     Duplicate Therapy     Allergy     No Medication Administration End Date     Incorrect Dose     Additional Drug Therapy Needed     Significant med changes from prior encounter (inform family/care partners about these prior to discharge).    Other       Clinically significant medication issues were identified that warrant physician communication and completion of prescribed/recommended actions by midnight of the next day:  No   Time spent performing this drug regimen review (minutes):  30   Shawan Tosh BS, PharmD, BCPS Clinical Pharmacist 08/11/2023 7:12 AM  Contact: [phone] after 3 PM  "Be curious, not judgmental..." -Debbora Dus

## 2023-08-12 DIAGNOSIS — R7303 Prediabetes: Secondary | ICD-10-CM | POA: Diagnosis not present

## 2023-08-12 DIAGNOSIS — I10 Essential (primary) hypertension: Secondary | ICD-10-CM | POA: Diagnosis not present

## 2023-08-12 DIAGNOSIS — I639 Cerebral infarction, unspecified: Secondary | ICD-10-CM | POA: Diagnosis not present

## 2023-08-12 NOTE — Progress Notes (Incomplete)
Physical Therapy Session Note  Patient Details  Name: [name] MRN: [id number] Date of Birth: 1951-05-09  Today's Date: 08/12/2023 PT Individual Time:  -      Short Term Goals: Week 1:  PT Short Term Goal 1 (Week 1): Pt will perform functional standing transfers with light CGA using LRAD. PT Short Term Goal 2 (Week 1): Pt will ambulate community distances using LRAD with consistent supervision and min vc for technique. PT Short Term Goal 3 (Week 1): Pt will complete appropriate outcome measures.  Skilled Therapeutic Interventions/Progress Updates: Patient *** on entrance to room. Patient alert and agreeable to PT session.   Patient reported ***  Therapeutic Activity: Bed Mobility: Pt performed supine<>sit on EOB with ***. VC required for ***. Transfers: Pt performed sit<>stand and *** transfers throughout session with ***. Provided VC for ***.  Gait Training:  Pt ambulated *** using *** with ***. Pt demonstrated the following gait deviations with therapist providing the described cuing and facilitation for improvement:   Wheelchair Mobility:  Pt propelled wheelchair *** with ***. Provided VC for ***  Neuromuscular Re-ed: NMR facilitated during session with focus on***. ***  NMR performed for improvements in motor control and coordination, balance, sequencing, judgement, and self confidence/ efficacy in performing all aspects of mobility at highest level of independence.   Therapeutic Exercise: Pt performed the following exercises with therapist providing the described cuing and facilitation for improvement. ***  Patient *** at end of session with brakes locked, *** alarm set, and all needs within reach.      Therapy Documentation Precautions:  Precautions Precautions: Fall Precaution Comments: L side hemipareisis with reduced coordination Restrictions Weight Bearing Restrictions: No  Therapy/Group: Individual Therapy  Priscilla Wilson 08/12/2023, 7:53 AM

## 2023-08-12 NOTE — Progress Notes (Signed)
Occupational Therapy Session Note  Patient Details  Name: Priscilla Wilson MRN: [id number] Date of Birth: 09/18/1951  Today's Date: 08/12/2023 OT Individual Time: 0830-0900 OT Individual Time Calculation (min): 30 min    Short Term Goals: Week 1:  OT Short Term Goal 1 (Week 1): Pt will demonstrate improved L Wilson coordination to fasten and unfasten her bra. OT Short Term Goal 2 (Week 1): Pt will be able to use L Wilson to hold fork when cutting meat with R Wilson. OT Short Term Goal 3 (Week 1): pt will be able to use L Wilson to place food items in kitchen pantry.  Skilled Therapeutic Interventions/Progress Updates:    Pt received sitting EOB ready to engage in therapy. She ambulated to toilet with RW CGA. Cues to work on spacing of body and seating surface for safe sit.   Toileted with supervision.  Stood at sink to wash hands and brush teeth and hair. Pt able to fasten bra today but had difficulty with snaps on pants due to needing to pull waist band tightly and did not have enough pinch strength  with Left Wilson.   When turning to sit down to Left to recliner she did not descend smoothly, had pt repeat process with aligning her body with the seat.  Pt did well on 2nd trial and able to move to sit with supervision.    Encouraged pt to work with therapy and set table up in front of her.  Bed alarm and all needs met.      Therapy Documentation Precautions:  Precautions Precautions: Fall Precaution Comments: L side hemipareisis with reduced coordination Restrictions Weight Bearing Restrictions: No     Pain: Pain Assessment Pain Score: 0-No pain ADL: ADL ADL Comments: supervision overall except min A to fasten bra and CGA with ADL transfers   Therapy/Group: Individual Therapy  Janeil Schexnayder 08/12/2023, 12:36 PM

## 2023-08-12 NOTE — Progress Notes (Signed)
Physical Therapy Session Note  Patient Details  Name: Priscilla Wilson MRN: [id number] Date of Birth: 1951-07-12  Today's Date: 08/12/2023 PT Individual Time: 1005-1047 PT Individual Time Calculation (min): 42 min   Short Term Goals: Week 1:  PT Short Term Goal 1 (Week 1): Pt will perform functional standing transfers with light CGA using LRAD. PT Short Term Goal 2 (Week 1): Pt will ambulate community distances using LRAD with consistent supervision and min vc for technique. PT Short Term Goal 3 (Week 1): Pt will complete appropriate outcome measures.  Skilled Therapeutic Interventions/Progress Updates: Patient sitting in recliner on entrance to room. Patient alert and agreeable to PT session.   Patient reported no pain at beginning of PT session. Today's session focused on outcome measures.   Pt instructed to perform L LE NMR at end of session while waiting for next PT session. Tape on floor with cues for pt to tap tape with L LE in 3 planes to challenge ataxia.   Therapeutic Activity: Transfers: Pt performed sit<>stand transfers throughout session with RW and with supervision.  - Pt with L HHA to step onto weighted scale at beginning of session for safety (from recliner).  6 Min Walk Test:  Instructed patient to ambulate as quickly and as safely as possible for 6 minutes using LRAD. Patient was allowed to take standing rest breaks without stopping the test, but if the patient required a sitting rest break the clock would be stopped and the test would be over.  Results: 618 feet (188.366 meters, Avg speed .11m/s) using a RW with CGA for safety. Results indicate that the patient has reduced endurance with ambulation compared to age matched norms.  Age Matched Norms: 71-69 yo M: 65 F: 39, 47-79 yo M: 34 F: 471, 30-89 yo M: 417 F: 392 MDC: 58.21 meters (190.98 feet) or 50 meters (ANPTA Core Set of Outcome Measures for Adults with Neurologic Conditions, 2018)   - 5xSTS - 18 seconds with  no B UE support   Patient sitting in WC at nsg station due to room requiring maintenance at end of session with brakes locked, and belt alarm set.      Therapy Documentation Precautions:  Precautions Precautions: Fall Precaution Comments: L side hemipareisis with reduced coordination Restrictions Weight Bearing Restrictions: No   Therapy/Group: Individual Therapy  Raziah Funnell PTA 08/12/2023, 12:24 PM

## 2023-08-12 NOTE — Progress Notes (Signed)
Physical Therapy Session Note  Patient Details  Name: Priscilla Wilson MRN: [id number] Date of Birth: [birth year]  Today's Date: 08/12/2023 PT Individual Time: 1102-1203 PT Individual Time Calculation (min): 61 min   Short Term Goals: Week 1:  PT Short Term Goal 1 (Week 1): Pt will perform functional standing transfers with light CGA using LRAD. PT Short Term Goal 2 (Week 1): Pt will ambulate community distances using LRAD with consistent supervision and min vc for technique. PT Short Term Goal 3 (Week 1): Pt will complete appropriate outcome measures.  Skilled Therapeutic Interventions/Progress Updates:  Patient supine in bed on entrance to room. Patient alert and agreeable to PT session.   Patient with no pain complaint at start of session.  Therapeutic Activity: Bed Mobility: Pt performed supine <> sit with supervision. No cueing provided.  Transfers: Pt performed sit<>stand and stand pivot transfers throughout session with CGA/ MinA for increased difficulty in LLE advancement in pivot stepping. Provided verbal cues for increasing conscious focus and prior to performance.   Gait Training:  Pt ambulated 125 ft using RW with CGA/ light MinA for intermittent toe catches. Provided vc/ tc for increased step height with pt continuing to self correct but with additional step length d/t increased time with lift of LLE during continued forward advancement.  Neuromuscular Re-ed: NMR facilitated during session with focus on coordination, balance, motor control, proprioception. Pt guided in blocked practice of sit<>stands to/ from wheeled stool in ortho gym. Pt is able to return demonstrate good controlled movement following verbal instruction with visual demonstration.   Progressed to tall kneeling to bench on mat table. Guided in sit to heels and then rise to tall kneeling with vc for increased hip extension and bias of weight over LLE and LUE. With increased knee pain, activity stopped. Progressed  to quadruped on mat table into 3 point contact with block to L elbow and raise of RUE into lateral abduction with spinal rotation x5 and then straight forward shoulder flexion x5. Pt c/o wrist pain d/t arthritis and adjusted to modified quadruped positioning. Guided in forward hand walking toward target object and pt instructed to tap with LUE. Improved positioning for wrists and less guard required for L elbow. Increased extranneous movement in "zeroing in" on target with L shoulder flexion and long lever arm.   BITS used in single target tracking around screen with pt improving in performance overall  in correct touch after practice, but noted better performance with cross body to direct anterior positioning to L shoulder. With more lateral targets, ataxia increases. Adjusted to alphabet progression with scattered targets and slight improvement in accuracy with need to mentally locate and remember some locations. Continued improvement to accuracy with addition in next bout with 2.5#aw to L wrist.   NMR performed for improvements in motor control and coordination, balance, sequencing, judgement, and self confidence/ efficacy in performing all aspects of mobility at highest level of independence.   Patient seated on EOB at end of session with brakes locked, bed alarm set, and all needs within reach. Tray table setup for upcoming lunch arrival.    Therapy Documentation Precautions:  Precautions Precautions: Fall Precaution Comments: L side hemipareisis with reduced coordination Restrictions Weight Bearing Restrictions: No  Pain:  Acute low level pain during session in L knee and wrists. Addressed with repositioning.   Therapy/Group: Individual Therapy  Loel Dubonnet PT, DPT, CSRS 08/12/2023, 6:42 PM

## 2023-08-12 NOTE — Plan of Care (Signed)
  Problem: RH Balance Goal: LTG Patient will maintain dynamic standing balance (PT) Description: LTG:  Patient will maintain dynamic standing balance with assistance during mobility activities (PT) Flowsheets (Taken 08/11/2023 1846) LTG: Pt will maintain dynamic standing balance during mobility activities with:: Supervision/Verbal cueing   Problem: Sit to Stand Goal: LTG:  Patient will perform sit to stand with assistance level (PT) Description: LTG:  Patient will perform sit to stand with assistance level (PT) Flowsheets (Taken 08/11/2023 1846) LTG: PT will perform sit to stand in preparation for functional mobility with assistance level: Independent with assistive device   Problem: RH Bed Mobility Goal: LTG Patient will perform bed mobility with assist (PT) Description: LTG: Patient will perform bed mobility with assistance, with/without cues (PT). Flowsheets (Taken 08/11/2023 1846) LTG: Pt will perform bed mobility with assistance level of: Independent   Problem: RH Bed to Chair Transfers Goal: LTG Patient will perform bed/chair transfers w/assist (PT) Description: LTG: Patient will perform bed to chair transfers with assistance (PT). Flowsheets (Taken 08/11/2023 1846) LTG: Pt will perform Bed to Chair Transfers with assistance level: Independent with assistive device    Problem: RH Car Transfers Goal: LTG Patient will perform car transfers with assist (PT) Description: LTG: Patient will perform car transfers with assistance (PT). Flowsheets (Taken 08/11/2023 1846) LTG: Pt will perform car transfers with assist:: Supervision/Verbal cueing   Problem: RH Furniture Transfers Goal: LTG Patient will perform furniture transfers w/assist (OT/PT) Description: LTG: Patient will perform furniture transfers  with assistance (OT/PT). Flowsheets (Taken 08/11/2023 1846) LTG: Pt will perform furniture transfers with assist:: Supervision/Verbal cueing   Problem: RH Ambulation Goal: LTG Patient will  ambulate in home environment (PT) Description: LTG: Patient will ambulate in home environment, # of feet with assistance (PT). Flowsheets (Taken 08/11/2023 1846) LTG: Pt will ambulate in home environ  assist needed:: Independent with assistive device LTG: Ambulation distance in home environment: up to 50' per bout using LRAD Goal: LTG Patient will ambulate in community environment (PT) Description: LTG: Patient will ambulate in community environment, # of feet with assistance (PT). Flowsheets (Taken 08/11/2023 1846) LTG: Pt will ambulate in community environ  assist needed:: Supervision/Verbal cueing LTG: Ambulation distance in community environment: >300' per bout using LRAD   Problem: RH Stairs Goal: LTG Patient will ambulate up and down stairs w/assist (PT) Description: LTG: Patient will ambulate up and down # of stairs with assistance (PT) Flowsheets (Taken 08/11/2023 1846) LTG: Pt will ambulate up/down stairs assist needed:: Supervision/Verbal cueing LTG: Pt will  ambulate up and down number of stairs: at least 4 steps using single HR as per home environment

## 2023-08-12 NOTE — Progress Notes (Signed)
Inpatient Rehabilitation Care Coordinator Assessment and Plan Patient Details  Name: Priscilla Wilson MRN: [id number] Date of Birth: 1951-02-03  Today's Date: 08/12/2023  Hospital Problems: Principal Problem:   Acute CVA (cerebrovascular accident) [facility])  Past Medical History: History reviewed. No pertinent past medical history. Past Surgical History:  Past Surgical History:  Procedure Laterality Date   BREAST EXCISIONAL BIOPSY Left    benign   Social History:  reports that she has never smoked. She has been exposed to tobacco smoke. She has never used smokeless tobacco. She reports that she does not currently use alcohol. She reports that she does not use drugs.  Family / Support Systems Marital Status: Married How Long?: n/a Patient Roles: Spouse Spouse/Significant Other: Scientist, research (medical) Children: Daughter Other Supports: N/A Anticipated Caregiver: Spouse, Thomas and PRN, daughter Ability/Limitations of Caregiver: none Caregiver Availability: 24/7 Family Dynamics: supportive family  Social History Preferred language: English Religion:  Cultural Background: indep, active prior to admit, no DME, driving Education: hs Health Literacy - How often do you need to have someone help you when you read instructions, pamphlets, or other written material from your doctor or pharmacy?: Never Writes: Yes Employment Status: Unemployed Date Retired/Disabled/Unemployed: n/a Marine scientist Issues: n/a Guardian/Conservator: n/a   Abuse/Neglect Abuse/Neglect Assessment Can Be Completed: Yes Physical Abuse: Denies Verbal Abuse: Denies Sexual Abuse: Denies Exploitation of patient/patient's resources: Denies Self-Neglect: Denies  Patient response to: Social Isolation - How often do you feel lonely or isolated from those around you?: Never  Emotional Status Pt's affect, behavior and adjustment status: pleasant Recent Psychosocial Issues: coping Psychiatric History:  n/a Substance Abuse History: n/a  Patient / Family Perceptions, Expectations & Goals Pt/Family understanding of illness & functional limitations: yes Premorbid pt/family roles/activities: Independent and driving Anticipated changes in roles/activities/participation: assistance and supervision from spouse Pt/family expectations/goals: Sup/MOD I  Building surveyor: None Premorbid Home Care/DME Agencies: Other (Comment) Adult nurse (2 wheels), Shower seat, Grab bars - tub/shower) Transportation available at discharge: spouse/daughter Is the patient able to respond to transportation needs?: Yes In the past 12 months, has lack of transportation kept you from medical appointments or from getting medications?: No In the past 12 months, has lack of transportation kept you from meetings, work, or from getting things needed for daily living?: No Resource referrals recommended: Neuropsychology  Discharge Planning Living Arrangements: Spouse/significant other Support Systems: Spouse/significant other, Children Type of Residence: Private residence Insurance Resources: Media planner (specify) (HTA) Financial Resources: Family Support Financial Screen Referred: No Living Expenses: Own Money Management: Patient, Spouse Does the patient have any problems obtaining your medications?: No Home Management: Independent Patient/Family Preliminary Plans: Spouse and daughter able to assist Care Coordinator Barriers to Discharge: Insurance for SNF coverage Care Coordinator Anticipated Follow Up Needs: HH/OP Expected length of stay: 10-12 Days  Clinical Impression Sw met with patient and provided team conference updates. Patient with supervision-Mod I goals. Patient prefers family eduction with daughter. Sw will allow patient arrange the preferred day for spouse and daughter. Sw will FU with pt and family tomorrow to arrange.   Andria Rhein 08/12/2023, 3:07 PM

## 2023-08-12 NOTE — Patient Care Conference (Signed)
Inpatient RehabilitationTeam Conference and Plan of Care Update Date: 08/12/2023   Time: 10:03 AM    Patient Name: Priscilla Wilson      Medical Record Number: [id number]  Date of Birth: [birth year] Sex: Female         Room/Bed: 4M06C/4M06C-01 Payor Info: Payor: [employer]na [employer] ADVANTAGE / Plan: [employer] PPO / Product Type: *No Product type* /    Admit Date/Time:  08/10/2023  2:06 PM  Primary Diagnosis:  Acute CVA (cerebrovascular accident) [facility])  Hospital Problems: Principal Problem:   Acute CVA (cerebrovascular accident) [facility])    Expected Discharge Date: Expected Discharge Date: 08/21/23  Team Members Present: Physician leading conference: Dr. Claudette Laws Social Worker Present: Lavera Guise, BSW Nurse Present: Chana Bode, RN OT Present: Primitivo Gauze, OT SLP Present: Everardo Pacific, SLP     Current Status/Progress Goal Weekly Team Focus  Bowel/Bladder   Pt continent of bowel/bladder   Pt will remain continent of bowel/bladder   Will assess qshift and PRN    Swallow/Nutrition/ Hydration               ADL's   supervision with self care, CGA mobility with RW   Mod Ind   coordination, balance, pt education    Mobility   Bed mobility = supervision, transfers = CGA/ minA, ambulation = up to 200' using up to MinA with RW, stairs = MinA.   Mod I/ supervision  Barriers: L hemipareisis and ataxic movements /// Work on: general and L sided strengthening, L hemi NMR, conscious focus to intended movements, reducing needed lOA for transfers and gait, pt/family education    Communication                Safety/Cognition/ Behavioral Observations               Pain   Pt denies pain   Pt will continue deny pain   Will assess qshift and PRN    Skin   Pt has echymosis on both arms   Pt's echymosis will go away  Will assess qshift and PRN      Discharge Planning:  Discharging homr with spouse.Marland Kitchen 1level home, 4 steps   Team  Discussion: Patient with numbness and heaviness and fine motor deficits post lacunar CVA along with balance and coordination due to ataxic movements.  Patient on target to meet rehab goals: yes, currently needs supervision for ADLs and min assist for transfers and steps.  Goals for discharge set for mod I overall.  *See Care Plan and progress notes for long and short-term goals.   Revisions to Treatment Plan:  Doppler- WNL   Teaching Needs: Safety, medications, transfers, toileting, etc.   Current Barriers to Discharge: None  Possible Resolutions to Barriers: Family education  Follow-up PCP Sigmund Hazel, MD in 2 weeks following discharge from rehab.  Follow-up in [facility] in 8 weeks following discharge from rehab, office to schedule an appointment      Medical Summary Current Status: Uncontrolled HTN,occ cough  Barriers to Discharge: Medical stability;Uncontrolled Hypertension   Possible Resolutions to [employer]: adjust BP meds, cough suppresant   Continued Need for Acute Rehabilitation Level of Care: The patient requires daily medical management by a physician with specialized training in physical medicine and rehabilitation for the following reasons: Direction of a multidisciplinary physical rehabilitation program to maximize functional independence : Yes Medical management of patient stability for increased activity during participation in an intensive rehabilitation regime.: Yes Analysis of laboratory values  and/or radiology reports with any subsequent need for medication adjustment and/or medical intervention. : Yes   I attest that I was present, lead the team conference, and concur with the assessment and plan of the team.   Chana Bode B 08/12/2023, 5:01 PM

## 2023-08-12 NOTE — Progress Notes (Signed)
PROGRESS NOTE   Subjective/Complaints:  No issues overnite , numbness in lower face improving, also has numbness LUE>LLE    ROS- neg CP, SOB, N/V/D Objective:   VAS [place] LOWER EXTREMITY VENOUS (DVT)  Result Date: 08/11/2023  Lower Venous DVT Study Patient Name:  Priscilla Wilson  Date of Exam:   08/11/2023 Medical Rec #: [id number]       Accession #:    [id number] Date of Birth: [birth year]       Patient Gender: F Patient Age:   72 years Exam Location:  [facility] Procedure:      VAS [place] LOWER EXTREMITY VENOUS (DVT) Referring Phys: Wendi Maya --------------------------------------------------------------------------------  Indications: Bruising on left lateral calf.  Comparison Study: No priors. Performing Technologist: Marilynne Halsted RDMS, RVT  Examination Guidelines: A complete evaluation includes B-mode imaging, spectral Doppler, color Doppler, and power Doppler as needed of all accessible portions of each vessel. Bilateral testing is considered an integral part of a complete examination. Limited examinations for reoccurring indications may be performed as noted. The reflux portion of the exam is performed with the patient in reverse Trendelenburg.  +---------+---------------+---------+-----------+----------+--------------+ RIGHT    CompressibilityPhasicitySpontaneityPropertiesThrombus Aging +---------+---------------+---------+-----------+----------+--------------+ CFV      Full           Yes      Yes                                 +---------+---------------+---------+-----------+----------+--------------+ SFJ      Full                                                        +---------+---------------+---------+-----------+----------+--------------+ FV Prox  Full                                                        +---------+---------------+---------+-----------+----------+--------------+ FV Mid   Full                                                         +---------+---------------+---------+-----------+----------+--------------+ FV DistalFull                                                        +---------+---------------+---------+-----------+----------+--------------+ PFV      Full                                                        +---------+---------------+---------+-----------+----------+--------------+  POP      Full           Yes      Yes                                 +---------+---------------+---------+-----------+----------+--------------+ PTV      Full                                                        +---------+---------------+---------+-----------+----------+--------------+ PERO     Full                                                        +---------+---------------+---------+-----------+----------+--------------+   +---------+---------------+---------+-----------+----------+--------------+ LEFT     CompressibilityPhasicitySpontaneityPropertiesThrombus Aging +---------+---------------+---------+-----------+----------+--------------+ CFV      Full           Yes      Yes                                 +---------+---------------+---------+-----------+----------+--------------+ SFJ      Full                                                        +---------+---------------+---------+-----------+----------+--------------+ FV Prox  Full                                                        +---------+---------------+---------+-----------+----------+--------------+ FV Mid   Full                                                        +---------+---------------+---------+-----------+----------+--------------+ FV DistalFull                                                        +---------+---------------+---------+-----------+----------+--------------+ PFV      Full                                                         +---------+---------------+---------+-----------+----------+--------------+ POP      Full           Yes      Yes                                 +---------+---------------+---------+-----------+----------+--------------+  PTV      Full                                                        +---------+---------------+---------+-----------+----------+--------------+ PERO     Full                                                        +---------+---------------+---------+-----------+----------+--------------+     Summary: BILATERAL: - No evidence of deep vein thrombosis seen in the lower extremities, bilaterally. -No evidence of popliteal cyst, bilaterally.   *See table(s) above for measurements and observations. Electronically signed by Carolynn Sayers on 08/11/2023 at 5:41:18 PM.    Final    Recent Labs    08/10/23 0746 08/11/23 0621  WBC 8.4 7.9  HGB 13.8 13.6  HCT 43.3 43.8  PLT 246 248   Recent Labs    08/10/23 0746 08/11/23 0621  NA 138 139  K 4.2 4.2  CL 107 110  CO2 20* 23  GLUCOSE 113* 123*  BUN 21 21  CREATININE 0.93 0.94  CALCIUM 9.0 9.3    Intake/Output Summary (Last 24 hours) at 08/12/2023 0820 Last data filed at 08/12/2023 0737 Gross per 24 hour  Intake 600 ml  Output --  Net 600 ml        Physical Exam: Vital Signs Blood pressure (!) 156/65, pulse 68, temperature 98 F (36.7 C), resp. rate 18, height 5' (1.524 m), weight 62.7 kg, SpO2 98%.   General: No acute distress Mood and affect are appropriate Heart: Regular rate and rhythm no rubs murmurs or extra sounds Lungs: Clear to auscultation, breathing unlabored, no rales or wheezes Abdomen: Positive bowel sounds, soft nontender to palpation, nondistended Extremities: No clubbing, cyanosis, or edema Skin: No evidence of breakdown, no evidence of rash Neurologic: Cranial nerves II through XII intact, motor strength is 5/5 in bilateral deltoid, bicep, tricep, grip, hip flexor, knee  extensors, ankle dorsiflexor and plantar flexor Sensory exam normal sensation to light touch and proprioception in left  upper  Cerebellar exam mod dysmetria Left finger to nose to finger Musculoskeletal: Full range of motion in all 4 extremities. No joint swelling   Assessment/Plan: 1. Functional deficits which require 3+ hours per day of interdisciplinary therapy in a comprehensive inpatient rehab setting. Physiatrist is providing close team supervision and 24 hour management of active medical problems listed below. Physiatrist and rehab team continue to assess barriers to discharge/monitor patient progress toward functional and medical goals  Care Tool:  Bathing    Body parts bathed by patient: Right arm, Left arm, Chest, Abdomen, Front perineal area, Buttocks, Right upper leg, Face, Left lower leg, Right lower leg, Left upper leg         Bathing assist Assist Level: Supervision/Verbal cueing     Upper Body Dressing/Undressing Upper body dressing   What is the patient wearing?: Bra, Pull over shirt    Upper body assist Assist Level: Minimal Assistance - Patient > 75%    Lower Body Dressing/Undressing Lower body dressing      What is the patient wearing?: Underwear/pull up, Pants     Lower body assist Assist for lower  body dressing: Supervision/Verbal cueing     Toileting Toileting    Toileting assist Assist for toileting: Supervision/Verbal cueing     Transfers Chair/bed transfer  Transfers assist     Chair/bed transfer assist level: Contact Guard/Touching assist     Locomotion Ambulation   Ambulation assist      Assist level: Minimal Assistance - Patient > 75%   Max distance: 200 ft   Walk 10 feet activity   Assist     Assist level: Supervision/Verbal cueing Assistive device: Walker-rolling   Walk 50 feet activity   Assist    Assist level: Contact Guard/Touching assist      Walk 150 feet activity   Assist    Assist level:  Contact Guard/Touching assist Assistive device: Walker-rolling    Walk 10 feet on uneven surface  activity   Assist Walk 10 feet on uneven surfaces activity did not occur: Safety/medical concerns         Wheelchair     Assist Is the patient using a wheelchair?: No Type of Wheelchair: Manual    Wheelchair assist level: Dependent - Patient 0% Max wheelchair distance: 200 ft    Wheelchair 50 feet with 2 turns activity    Assist        Assist Level: Dependent - Patient 0%   Wheelchair 150 feet activity     Assist      Assist Level: Dependent - Patient 0%   Blood pressure (!) 156/65, pulse 68, temperature 98 F (36.7 C), resp. rate 18, height 5' (1.524 m), weight 62.7 kg, SpO2 98%.  Medical Problem List and Plan: 1. 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  Antithrombotics: -DVT/anticoagulation:  Pharmaceutical: Lovenox>start DVU neg for DVT, amb >100' may be able to d/c lovenox soon              -antiplatelet therapy: Aspirin and Plavix for three weeks followed by aspirin alone (stop Plavix 10/17)   3. Pain Management: Tylenol as needed   4. Mood/Behavior/Sleep: LCSW to evaluate and provide emotional support             -pt appears positive and up beat. Wants to regain independence!             -continue Effexor-XR 75 mg q AM             -antipsychotic agents: n/a   5. Neuropsych/cognition: This patient is capable of making decisions on her own behalf.   6. Skin/Wound Care: Routine skin care checks   7. Fluids/Electrolytes/Nutrition: Routine Is and Os and follow-up chemistries   8: Hypertension: monitor TID and prn             -continue Lopressor 25 mg daily   Vitals:   08/11/23 1951 08/12/23 0517  BP: (!) 147/68 (!) 156/65  Pulse: 65 68  Resp: 17 18  Temp:  97.9 F (36.6 C) 98 F (36.7 C)  SpO2: 97% 98%  Elevated systolic may need to adjust   9: Hyperlipidemia: continue statin   10: GERD: continue home famotidine   11: Hyperthyroidism with palpitations: continue methimazole and BB   12: History of asthma: continue albuterol MDI as needed   13: Prediabetes: carb  modified diet              -sugars have been well controlled. Probably can dc cbg's within the next 24 hours if they remain stable  CBG (last 3)  Recent Labs    08/10/23 0634 08/10/23 1235 08/10/23 1644  GLUCAP 111* 124* 100*   May d/c 10/1        LOS: 2 days A FACE TO FACE EVALUATION WAS PERFORMED  Victorino Sparrow Arav Bannister 08/12/2023, 8:20 AM

## 2023-08-12 NOTE — Progress Notes (Signed)
Patient ID: Priscilla Wilson, female   DOB: August 23, 1951, 72 y.o.   MRN: [id number]  Team Conference Report to Patient/Family  Team Conference discussion was reviewed with the patient and caregiver, including goals, any changes in plan of care and target discharge date.  Patient and caregiver express understanding and are in agreement.  The patient has a target discharge date of 08/21/23.  Sw met with patient and provided team conference updates. Patient with supervision-Mod I goals. Patient prefers family eduction with daughter. Sw will allow patient arrange the preferred day for spouse and daughter. Sw will FU with pt and family tomorrow to arrange.   Andria Rhein 08/12/2023, 1:19 PM

## 2023-08-12 NOTE — Progress Notes (Signed)
Physical Therapy Session Note  Patient Details  Name: Priscilla Wilson MRN: [id number] Date of Birth: [birth year]  Today's Date: 08/12/2023 PT Individual Time: 4098-1191 PT Individual Time Calculation (min): 74 min   Short Term Goals: Week 1:  PT Short Term Goal 1 (Week 1): Pt will perform functional standing transfers with light CGA using LRAD. PT Short Term Goal 2 (Week 1): Pt will ambulate community distances using LRAD with consistent supervision and min vc for technique. PT Short Term Goal 3 (Week 1): Pt will complete appropriate outcome measures.  Skilled Therapeutic Interventions/Progress Updates:  Patient seated upright on EOB on entrance to room. Patient alert and agreeable to PT session.   Patient with no pain complaint at start of session.  Therapeutic Activity: Bed Mobility: Pt performed supine <> sit with supervision/ Mod I. No cueing required.  Transfers: Pt performed sit<>stand and stand pivot transfers throughout session with CGA. Provided vc  for conscious focus/ practice to improve coordinated movement.  Gait Training/ Neuromuscular Re-ed: Pt ambulated 175 ft using RW with CGA/ light MinA. Demonstrated continued increased step length following vc for correction of decreased step height. Provided vc/ tc for this as well as upright posture throughout.  NMR facilitated during session with focus on standing balance, dynamic balance, quality of gait. Pt guided in berg balance test. Patient demonstrates increased fall risk as noted by score of  43/56 on Berg Balance Scale.  (<36= high risk for falls, close to 100%; 37-45 significant >80%; 46-51 moderate >50%; 52-55 lower >25%)  Gait training in main gym with no AD and 4# aw to LLE and 2.5#aw to LUE. Initial ataxia noted with significant improvement over 55' distance. Return distance and additional 20' distance with continued improvement. 25' distance with weight removed and improvement seen as well as pt noting improvement. Focus  throughout on reducing L step length with improved foot clearance.   Pt also guided in lateral stepping over 25' each direction with 4# aw to LLE. Cued for increased step height and control in con/ eccentric directions. Improvement throughout with fwd LOB x3 requiring MinA to correct.    NMR performed for improvements in motor control and coordination, balance, sequencing, judgement, and self confidence/ efficacy in performing all aspects of mobility at highest level of independence.   Patient seated on EOB at end of session with brakes locked, bed alarm set, and all needs within reach.   Therapy Documentation Precautions:  Precautions Precautions: Fall Precaution Comments: L side hemipareisis with reduced coordination Restrictions Weight Bearing Restrictions: No  Pain:  No pain related this session.  Balance: Standardized Balance Assessment Standardized Balance Assessment: Berg Balance Test Berg Balance Test Sit to Stand: Able to stand  independently using hands Standing Unsupported: Able to stand 2 minutes with supervision Sitting with Back Unsupported but Feet Supported on Floor or Stool: Able to sit safely and securely 2 minutes Stand to Sit: Controls descent by using hands Transfers: Able to transfer safely, definite need of hands Standing Unsupported with Eyes Closed: Able to stand 10 seconds safely Standing Ubsupported with Feet Together: Able to place feet together independently and stand for 1 minute with supervision From Standing, Reach Forward with Outstretched Arm: Can reach forward >12 cm safely (5") From Standing Position, Pick up Object from Floor: Able to pick up shoe, needs supervision From Standing Position, Turn to Look Behind Over each Shoulder: Looks behind from both sides and weight shifts well Turn 360 Degrees: Able to turn 360 degrees safely but slowly Standing  Unsupported, Alternately Place Feet on Step/Stool: Able to stand independently and complete 8 steps  >20 seconds Standing Unsupported, One Foot in Front: Able to take small step independently and hold 30 seconds Standing on One Leg: Able to lift leg independently and hold 5-10 seconds Total Score: 43   Therapy/Group: Individual Therapy  Loel Dubonnet PT, DPT, CSRS 08/12/2023, 6:49 PM

## 2023-08-13 DIAGNOSIS — I10 Essential (primary) hypertension: Secondary | ICD-10-CM | POA: Insufficient documentation

## 2023-08-13 DIAGNOSIS — R7303 Prediabetes: Secondary | ICD-10-CM | POA: Diagnosis not present

## 2023-08-13 DIAGNOSIS — I69398 Other sequelae of cerebral infarction: Secondary | ICD-10-CM

## 2023-08-13 DIAGNOSIS — I639 Cerebral infarction, unspecified: Secondary | ICD-10-CM | POA: Diagnosis not present

## 2023-08-13 NOTE — Progress Notes (Signed)
Occupational Therapy Session Note  Patient Details  Name: Priscilla Wilson MRN: [id number] Date of Birth: [birth year]  Today's Date: 08/13/2023 OT Individual Time: 9528-4132 OT Individual Time Calculation (min): 75 min    Short Term Goals: Week 1:  OT Short Term Goal 1 (Week 1): Pt will demonstrate improved L hand coordination to fasten and unfasten her bra. OT Short Term Goal 2 (Week 1): Pt will be able to use L hand to hold fork when cutting meat with R hand. OT Short Term Goal 3 (Week 1): pt will be able to use L hand to place food items in kitchen pantry.  Skilled Therapeutic Interventions/Progress Updates:     Pt received in recliner and ready for therapy.  She did need to use the bathroom, washed hands in standing all with supervision.   Ambulated to gym with RW with light CGA.  C/o general stiffness and tightness in L side.  Pt tends to over tense muscles to compensate for decreased sensation.   Pt moved into supine for  stretching for arms overhead and in "sunbather" stretch,  hip and torso stretches - pt could not feel left arm stretching but did feel in L glute Used massage gun on left arm and leg and pt was able to feel vibration.  Quadriped pushups, alternating hands to elbows with childs pose stretch for back  Reciprocal scooting on mat from sit to long sit and back to sit.  In long sit, hamstring and adductor stretches  Sitting on mat, place 1.5 lb wrist wt on L with B hands on hula hoop for controlled sh flexion and abd with turning hoop and raising overhead.  Repeated exercises without hand weight. LUE stretches as pt often states her arm is heavy and tight.    Pt then stepped to parallel bars,  standing pushups on bars with guarding support to L side.  Lateral left lunges with feet static with pt moving left to center with reaching left arm out to side and back in close chain exercises.   Pt said she used to like to belly dance,  worked on isolated hip movements with hips  lift to activate glutes.    Pt then ambulated back to her room.  Pt resting in recliner with all needs met. Alarm set and call light in reach.    Therapy Documentation Precautions:  Precautions Precautions: Fall Precaution Comments: L side hemipareisis with reduced coordination Restrictions Weight Bearing Restrictions: No   Pain: Pain Assessment Pain Score: 0-No pain ADL: ADL ADL Comments: supervision overall except min A to fasten bra and CGA with ADL transfers   Therapy/Group: Individual Therapy  Jacobo Moncrief 08/13/2023, 10:19 AM

## 2023-08-13 NOTE — Progress Notes (Signed)
Physical Therapy Session Note  Patient Details  Name: Priscilla Wilson MRN: [id number] Date of Birth: 16-Nov-1950  Today's Date: 08/13/2023 PT Individual Time: 0805-0918 PT Individual Time Calculation (min): 73 min   Short Term Goals: Week 1:  PT Short Term Goal 1 (Week 1): Pt will perform functional standing transfers with light CGA using LRAD. PT Short Term Goal 2 (Week 1): Pt will ambulate community distances using LRAD with consistent supervision and min vc for technique. PT Short Term Goal 3 (Week 1): Pt will complete appropriate outcome measures.  Skilled Therapeutic Interventions/Progress Updates: Patient sitting EOB on entrance to room. Patient alert and agreeable to PT session.   Patient reported no pain this morning and endorses "good" sleep the previous night.  Therapeutic Activity: Transfers: Pt performed sit<>stand transfers throughout session with supervision for safety.  Gait Training:  Pt ambulated room<main gym using RW with close supervision for safety. Pt demonstrated the following gait deviations with therapist providing the described cuing and facilitation for improvement:  - Decreased step clearance B - Decreased cadence - Pt with increased step length with L>R - Narrow BOS - Ataxic movement on L LE with pt reporting having to think about where to place step during gait cycle.  2nd Gait Trial Following L LE NMRE (200') - Pt ambulated around main gym with RW and no ankle weight donned. Pt presented with: - Decrease in ataxic movement in L LE  (pt reported that L LE feels more "straight" when stepping - Decrease in step length to a more normal step - Increase in BOS after VC - Pt performed 7 90* turns and presented with increase in L step length towards midline with VC to increase step width, and to slightly pivot L LE.  Neuromuscular Re-ed: NMR facilitated during session with focus on neuromuscular control/coordination on L LE. - Step up to 6" step with L LE  (5lb ankle weight donned). Pt with B UE support, and instructional cues to step up to step until close to fatigue. Pt then instructed to perform same task, this time with 3 colored dots on step and VC to step to called out color. Pt performed until close to fatigue and provided with standing rest break. Pt then performed another round until close to fatigue, and then ended with VC to step up to 2nd step from floor, and then down to 1st step to called out color, and then back up to 2nd step until fatigue (seated rest after). - Pt coordinating L LE from one side of black cup to other side (hip abduction/adduction) of cup with VC to touch heel on small targets (pt also with 5lb ankle weight donned). Pt performed until close to fatigue, and then progressed to PTA using yellow theraband around lower L leg, pulling into various directions for added challenge, and to control movement especially when being pulled in same directions). - Standing while grabbing pins off of basket ball net. Red, Green, Black. Pt cued to use L UE and to verbalize an animal that starts with that specific color. Pt required increased time to think of animals after first few. Pt with close supervision for safety and no UE support.  NMR performed for improvements in motor control and coordination, balance, sequencing, judgement, and self confidence/ efficacy in performing all aspects of mobility at highest level of independence.   Patient sitting in recliner at end of session with brakes locked, belt alarm set, and all needs within reach.      Therapy  Documentation Precautions:  Precautions Precautions: Fall Precaution Comments: L side hemipareisis with reduced coordination Restrictions Weight Bearing Restrictions: No General:   Vital Signs:    Therapy/Group: Individual Therapy  Dariel Betzer PTA 08/13/2023, 12:13 PM

## 2023-08-13 NOTE — IPOC Note (Signed)
Overall Plan of Care [facility]) Patient Details Name: Priscilla Wilson MRN: [id number] DOB: September 02, 1951  Admitting Diagnosis: Acute CVA (cerebrovascular accident) [facility])  Hospital Problems: Principal Problem:   Acute CVA (cerebrovascular accident) [facility]) Active Problems:   Essential hypertension   Alteration of sensations, post-stroke     Functional Problem List: Nursing Bowel, Safety, Endurance, Medication Management  PT Balance, Endurance, Motor, Perception, Safety  OT Balance, Motor  SLP    TR         Basic ADL's: OT Dressing, Toileting, Bathing     Advanced  ADL's: OT Simple Meal Preparation, Laundry, Light Housekeeping     Transfers: PT Bed Mobility, Bed to Chair, Car, Occupational psychologist, Research scientist (life sciences): PT Ambulation, Stairs     Additional Impairments: OT Fuctional Use of Upper Extremity  SLP        TR      Anticipated Outcomes Item Anticipated Outcome  Self Feeding independent  Swallowing      Basic self-care  Mod independent  Toileting  Mod Independent   Bathroom Transfers Mod independent  Bowel/Bladder  manage bowel w mod I assist  Transfers  Mod I  Locomotion  Mod I  Communication     Cognition     Pain  n/a  Safety/Judgment  manage w cues   Therapy Plan: PT Intensity: Minimum of 1-2 x/day ,45 to 90 minutes PT Frequency: 5 out of 7 days PT Duration Estimated Length of Stay: 9-12 days OT Intensity: Minimum of 1-2 x/day, 45 to 90 minutes OT Frequency: 5 out of 7 days OT Duration/Estimated Length of Stay: 10 days     Team Interventions: Nursing Interventions Patient/Family Education, Medication Management, Discharge Planning, Bowel Management, Disease Management/Prevention  PT interventions Ambulation/gait training, Cognitive remediation/compensation, Discharge planning, DME/adaptive equipment instruction, Functional mobility training, Pain management, Psychosocial support, Splinting/orthotics, Therapeutic Activities, UE/LE  Strength taining/ROM, Visual/perceptual remediation/compensation, Warden/ranger, Community reintegration, Disease management/prevention, Neuromuscular re-education, Equities trader education, Museum/gallery curator, Therapeutic Exercise, UE/LE Coordination activities  OT Interventions Warden/ranger, DME/adaptive equipment instruction, Disease mangement/prevention, Discharge planning, Functional mobility training, Neuromuscular re-education, Patient/family education, Psychosocial support, Self Care/advanced ADL retraining, Therapeutic Activities, Therapeutic Exercise, UE/LE Strength taining/ROM, UE/LE Coordination activities  SLP Interventions    TR Interventions    SW/CM Interventions Discharge Planning, Psychosocial Support, Patient/Family Education, Disease Management/Prevention   Barriers to Discharge MD  Medical stability  Nursing Home environment access/layout 2 level main B+B w spouse 4 ste, bil rails  PT Inaccessible home environment, Decreased caregiver support, Community education officer for SNF coverage    OT      SLP      SW Insurance for SNF coverage     Team Discharge Planning: Destination: PT-Home ,OT- Home , SLP-  Projected Follow-up: PT-Outpatient PT, 24 hour supervision/assistance, OT-  Outpatient OT, SLP-  Projected Equipment Needs: PT- , OT- To be determined, SLP-  Equipment Details: PT- , OT-  Patient/family involved in discharge planning: PT- Patient, Family member/caregiver,  OT-Patient, SLP-   MD ELOS: 10-12 Medical Rehab Prognosis:  Excellent Assessment: The patient has been admitted for CIR therapies with the diagnosis of acute right thalamic capsular infarct . The team will be addressing functional mobility, strength, stamina, balance, safety, adaptive techniques and equipment, self-care, bowel and bladder mgt, patient and caregiver education. Goals have been set at PT/OT supervision to mod I . Anticipated discharge destination is home.        See Team  Conference Notes for weekly updates to the plan of care

## 2023-08-13 NOTE — Progress Notes (Signed)
PROGRESS NOTE   Subjective/Complaints:  Reviewed d/c date, pdiscussed risk factor reduction - pt did have good health habits PTA   ROS- neg CP, SOB, N/V/D Objective:   VAS [place] LOWER EXTREMITY VENOUS (DVT)  Result Date: 08/11/2023  Lower Venous DVT Study Patient Name:  Priscilla Wilson  Date of Exam:   08/11/2023 Medical Rec #: [id number]       Accession #:    [id number] Date of Birth: May 11, 1951       Patient Gender: F Patient Age:   72 years Exam Location:  [facility] Procedure:      VAS [place] LOWER EXTREMITY VENOUS (DVT) Referring Phys: Wendi Maya --------------------------------------------------------------------------------  Indications: Bruising on left lateral calf.  Comparison Study: No priors. Performing Technologist: Marilynne Halsted RDMS, RVT  Examination Guidelines: A complete evaluation includes B-mode imaging, spectral Doppler, color Doppler, and power Doppler as needed of all accessible portions of each vessel. Bilateral testing is considered an integral part of a complete examination. Limited examinations for reoccurring indications may be performed as noted. The reflux portion of the exam is performed with the patient in reverse Trendelenburg.  +---------+---------------+---------+-----------+----------+--------------+ RIGHT    CompressibilityPhasicitySpontaneityPropertiesThrombus Aging +---------+---------------+---------+-----------+----------+--------------+ CFV      Full           Yes      Yes                                 +---------+---------------+---------+-----------+----------+--------------+ SFJ      Full                                                        +---------+---------------+---------+-----------+----------+--------------+ FV Prox  Full                                                        +---------+---------------+---------+-----------+----------+--------------+ FV Mid    Full                                                        +---------+---------------+---------+-----------+----------+--------------+ FV DistalFull                                                        +---------+---------------+---------+-----------+----------+--------------+ PFV      Full                                                        +---------+---------------+---------+-----------+----------+--------------+  POP      Full           Yes      Yes                                 +---------+---------------+---------+-----------+----------+--------------+ PTV      Full                                                        +---------+---------------+---------+-----------+----------+--------------+ PERO     Full                                                        +---------+---------------+---------+-----------+----------+--------------+   +---------+---------------+---------+-----------+----------+--------------+ LEFT     CompressibilityPhasicitySpontaneityPropertiesThrombus Aging +---------+---------------+---------+-----------+----------+--------------+ CFV      Full           Yes      Yes                                 +---------+---------------+---------+-----------+----------+--------------+ SFJ      Full                                                        +---------+---------------+---------+-----------+----------+--------------+ FV Prox  Full                                                        +---------+---------------+---------+-----------+----------+--------------+ FV Mid   Full                                                        +---------+---------------+---------+-----------+----------+--------------+ FV DistalFull                                                        +---------+---------------+---------+-----------+----------+--------------+ PFV      Full                                                         +---------+---------------+---------+-----------+----------+--------------+ POP      Full           Yes      Yes                                 +---------+---------------+---------+-----------+----------+--------------+  PTV      Full                                                        +---------+---------------+---------+-----------+----------+--------------+ PERO     Full                                                        +---------+---------------+---------+-----------+----------+--------------+     Summary: BILATERAL: - No evidence of deep vein thrombosis seen in the lower extremities, bilaterally. -No evidence of popliteal cyst, bilaterally.   *See table(s) above for measurements and observations. Electronically signed by Carolynn Sayers on 08/11/2023 at 5:41:18 PM.    Final    Recent Labs    08/10/23 0746 08/11/23 0621  WBC 8.4 7.9  HGB 13.8 13.6  HCT 43.3 43.8  PLT 246 248   Recent Labs    08/10/23 0746 08/11/23 0621  NA 138 139  K 4.2 4.2  CL 107 110  CO2 20* 23  GLUCOSE 113* 123*  BUN 21 21  CREATININE 0.93 0.94  CALCIUM 9.0 9.3    Intake/Output Summary (Last 24 hours) at 08/13/2023 0722 Last data filed at 08/12/2023 1837 Gross per 24 hour  Intake 600 ml  Output --  Net 600 ml        Physical Exam: Vital Signs Blood pressure 123/74, pulse 72, temperature 98.2 F (36.8 C), resp. rate 17, height 5' (1.524 m), weight 61.4 kg, SpO2 98%.   General: No acute distress Mood and affect are appropriate Heart: Regular rate and rhythm no rubs murmurs or extra sounds Lungs: Clear to auscultation, breathing unlabored, no rales or wheezes Abdomen: Positive bowel sounds, soft nontender to palpation, nondistended Extremities: No clubbing, cyanosis, or edema Skin: No evidence of breakdown, no evidence of rash Neurologic: Cranial nerves II through XII intact, motor strength is 5/5 in bilateral deltoid, bicep, tricep, grip, hip flexor, knee  extensors, ankle dorsiflexor and plantar flexor Sensory exam normal sensation to light touch and proprioception in left  upper  Cerebellar exam mod dysmetria Left finger to nose to finger Musculoskeletal: Full range of motion in all 4 extremities. No joint swelling   Assessment/Plan: 1. Functional deficits which require 3+ hours per day of interdisciplinary therapy in a comprehensive inpatient rehab setting. Physiatrist is providing close team supervision and 24 hour management of active medical problems listed below. Physiatrist and rehab team continue to assess barriers to discharge/monitor patient progress toward functional and medical goals  Care Tool:  Bathing    Body parts bathed by patient: Right arm, Left arm, Chest, Abdomen, Front perineal area, Buttocks, Right upper leg, Face, Left lower leg, Right lower leg, Left upper leg         Bathing assist Assist Level: Supervision/Verbal cueing     Upper Body Dressing/Undressing Upper body dressing   What is the patient wearing?: Bra, Pull over shirt    Upper body assist Assist Level: Minimal Assistance - Patient > 75%    Lower Body Dressing/Undressing Lower body dressing      What is the patient wearing?: Underwear/pull up, Pants     Lower body assist Assist for lower body  dressing: Supervision/Verbal cueing     Toileting Toileting    Toileting assist Assist for toileting: Supervision/Verbal cueing     Transfers Chair/bed transfer  Transfers assist     Chair/bed transfer assist level: Contact Guard/Touching assist     Locomotion Ambulation   Ambulation assist      Assist level: Minimal Assistance - Patient > 75%   Max distance: 200 ft   Walk 10 feet activity   Assist     Assist level: Supervision/Verbal cueing Assistive device: Walker-rolling   Walk 50 feet activity   Assist    Assist level: Contact Guard/Touching assist      Walk 150 feet activity   Assist    Assist level:  Contact Guard/Touching assist Assistive device: Walker-rolling    Walk 10 feet on uneven surface  activity   Assist Walk 10 feet on uneven surfaces activity did not occur: Safety/medical concerns         Wheelchair     Assist Is the patient using a wheelchair?: No Type of Wheelchair: Manual    Wheelchair assist level: Dependent - Patient 0% Max wheelchair distance: 200 ft    Wheelchair 50 feet with 2 turns activity    Assist        Assist Level: Dependent - Patient 0%   Wheelchair 150 feet activity     Assist      Assist Level: Dependent - Patient 0%   Blood pressure 123/74, pulse 72, temperature 98.2 F (36.8 C), resp. rate 17, height 5' (1.524 m), weight 61.4 kg, SpO2 98%.  Medical Problem List and Plan: 1. 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  Antithrombotics: -DVT/anticoagulation:  Pharmaceutical: Lovenox>start DVU neg for DVT, amb >100' may be able to d/c lovenox soon              -antiplatelet therapy: Aspirin and Plavix for three weeks followed by aspirin alone (stop Plavix 10/17)   3. Pain Management: Tylenol as needed   4. Mood/Behavior/Sleep: LCSW to evaluate and provide emotional support             -pt appears positive and up beat. Wants to regain independence!             -continue Effexor-XR 75 mg q AM             -antipsychotic agents: n/a   5. Neuropsych/cognition: This patient is capable of making decisions on her own behalf.   6. Skin/Wound Care: Routine skin care checks   7. Fluids/Electrolytes/Nutrition: Routine Is and Os and follow-up chemistries   8: Hypertension: monitor TID and prn             -continue Lopressor 25 mg daily   Vitals:   08/12/23 1958 08/13/23 0524  BP: 139/76 123/74  Pulse: 63 72  Resp: 17 17  Temp: (!) 97.5 F  (36.4 C) 98.2 F (36.8 C)  SpO2: 96% 98%  Controlled 10/3  9: Hyperlipidemia: continue statin- LDL is 88 at goal    10: GERD: continue home famotidine   11: Hyperthyroidism with palpitations: continue methimazole and BB   12: History of asthma: continue albuterol MDI as needed   13: Prediabetes: carb modified diet- resume  reg diet               -sugars have been well controlled. Probably can dc cbg's within the next 24 hours if they remain stable  CBG (last 3)  Recent Labs    08/10/23 1235 08/10/23 1644  GLUCAP 124* 100*   May d/c 10/2        LOS: 3 days A FACE TO FACE EVALUATION WAS PERFORMED  Erick Colace 08/13/2023, 7:22 AM

## 2023-08-13 NOTE — Group Note (Signed)
Patient Details Name: Trea Carnegie MRN: [id number] DOB: 08/27/1951 Today's Date: 08/13/2023  Time Calculation: OT Group Time Calculation OT Group Start Time: 1430 OT Group Stop Time: 1530 OT Group Time Calculation (min): 60 min      Group Description: Dance Group: Pt participated in dance group with an emphasis on social interaction, motor planning, increasing overall activity tolerance and bimanual tasks. All songs were selected by group members. Dance moves included AROM of BUE/BLE gross motor movements with an emphasis on building functional endurance.    Individual level documentation: Patient completed group from sitting level. Patient needed supervision to complete various dance moves with cues for body positioning and motivation .  Patient able to create her own modifications during group.  Pain:  0/10  Precautions:  Falls   Clide Deutscher 08/13/2023, 3:44 PM

## 2023-08-14 DIAGNOSIS — I10 Essential (primary) hypertension: Secondary | ICD-10-CM | POA: Diagnosis not present

## 2023-08-14 DIAGNOSIS — I639 Cerebral infarction, unspecified: Secondary | ICD-10-CM | POA: Diagnosis not present

## 2023-08-14 DIAGNOSIS — R7303 Prediabetes: Secondary | ICD-10-CM | POA: Diagnosis not present

## 2023-08-14 NOTE — Progress Notes (Signed)
Occupational Therapy Session Note  Patient Details  Name: Priscilla Wilson MRN: [id number] Date of Birth: [birth year]  Today's Date: 08/14/2023 OT Individual Time: 4098-1191 OT Individual Time Calculation (min): 40 min    Short Term Goals: Week 1:  OT Short Term Goal 1 (Week 1): Pt will demonstrate improved L hand coordination to fasten and unfasten her bra. OT Short Term Goal 2 (Week 1): Pt will be able to use L hand to hold fork when cutting meat with R hand. OT Short Term Goal 3 (Week 1): pt will be able to use L hand to place food items in kitchen pantry.  Skilled Therapeutic Interventions/Progress Updates:  Skilled OT intervention completed with focus on LUE NMR and functional ambulation. Pt received seated EOB, agreeable to session. No pain reported.  Completed all sit > stands with CGA/close supervision using RW. Ambulated from EOB with CGA and RW > toilet. Pt able to manage opening bathroom door without LOB however with decrease in coordination on Lt side with backwards stepping. Close supervision for urinary continent void and toileting steps. Ambulated to sink for hand hygiene with CGA and CGA for balance in stance.   Ambulated with CGA using RW > gym. Transitioned into quadruped with CGA, in prep for weight bearing through affected LUE, however pt with difficulty maintaining UB weight in just LUE while RUE involved in task and pt with fear of "shoulder giving out." Transitioned into semi side lying position, leaning onto LUE for balance while RUE engaged in Forensic psychologist. Pt generally able to sustain elbow extension and shoulder retraction with supervision, but needed intermittent CGA and min cues needed for activation with fatigue. Extensive conversation about weight bearing use for increasing proprioception, gross motor coordination and reducing ataxic patterns.  Transitioned to EOB with supervision and ambulated back to room with LLE noted to have beginning hip abduction on  swing phase and mild instability vs buckling on stance phase. Discussed reduction of proper form with fatigue onset and ways to manage fatigue with mobility especially at home and in the community.  Pt remained seated EOB, with bed alarm on/activated, and with all needs in reach at end of session.   Therapy Documentation Precautions:  Precautions Precautions: Fall Precaution Comments: L side hemipareisis with reduced coordination Restrictions Weight Bearing Restrictions: No    Therapy/Group: Individual Therapy  Melvyn Novas, MS, OTR/L  08/14/2023, 3:56 PM

## 2023-08-14 NOTE — Progress Notes (Signed)
Patient ID: Priscilla Wilson, female   DOB: 04-18-1951, 72 y.o.   MRN: [id number]  No DME needs currently.

## 2023-08-14 NOTE — Progress Notes (Signed)
Physical Therapy Session Note  Patient Details  Name: Priscilla Wilson MRN: [id number] Date of Birth: 1951-07-26  Today's Date: 08/14/2023 PT Individual Time: 0806-0904 PT Individual Time Calculation (min): 58 min   Short Term Goals: Week 1:  PT Short Term Goal 1 (Week 1): Pt will perform functional standing transfers with light CGA using LRAD. PT Short Term Goal 2 (Week 1): Pt will ambulate community distances using LRAD with consistent supervision and min vc for technique. PT Short Term Goal 3 (Week 1): Pt will complete appropriate outcome measures.  Skilled Therapeutic Interventions/Progress Updates: Patient sitting EOB with RN providing morning medication on entrance to room. Patient alert and agreeable to PT session.   Patient reported no pain this session.  Therapeutic Activity: Transfers: Pt performed sit<>stand transfers throughout session with supervision. No VC required.   Gait Training:  Pt ambulated room<ortho gym using RW with close supervision for safety. Pt demonstrated the following gait deviations with therapist providing the described cuing and facilitation for improvement:  - Ataxic on L LE - Decreased step clearance - Noted to have genu recurvatum this morning with pt reporting feeling it today, but not before - Decrease in exaggerated step length (pt would take longer step on L LE prior) - Narrow BOS  - L ankle into slight supination/inversion (MMT of evertors showed to be 4-/5    - NMRE - Pt ambulating in ortho gym with intentional steps focusing on controlling eccentric of L quadriceps in stance phase (pt in RW and close supervision/CGA for safety). Pt also cued to perform heel-strike bilaterally. Pt performed following knee flexion/extension NMRE and noted to have improved quadriceps eccentric control in stance phase with only a few instances of hyperextension in L LE (happened when pt became distracted).  Pt ambulated ortho gym<room at end of session using RW  with close supervision for safety. Pt instructionally cued to focus on soft bend in L knee and to evert L foot. Pt with notable decrease in hyperextending L knee, and increase in L LE evertor activation to avoid rolling ankle.   Neuromuscular Re-ed: NMR facilitated during session with focus on L LE neuromuscular control for gait cycle carryover. - Green theraband behind L knee pulling into flexion by PTA with pt cued to go into knee flexion/extension (focus on eccentric for quadriceps control to carryover to gait). Pt performed 2 rounds close to failure while standing in RW. - Green theraband in front of knee pulling into extension by PTA with same cues and focus on controlling L knee from snapping into TKE (1 round close to failure) - Eversion/pronation with yellow theraband. Pt cued to self perform intervention with focus on 3 second isometric hold, and controlled eccentric (close to failure for 2 rounds). Pt also instructed to perform exercise in room 2-3 sets close to failure through the day.   Pt was provided with demonstration and explanation on how each of these interventions play a role in increasing safe and functional ambulation through the gait cycle.  NMR performed for improvements in motor control and coordination, balance, sequencing, judgement, and self confidence/ efficacy in performing all aspects of mobility at highest level of independence.   Patient sitting in recliner at end of session with brakes locked, chair alarm set, and all needs within reach.      Therapy Documentation Precautions:  Precautions Precautions: Fall Precaution Comments: L side hemipareisis with reduced coordination Restrictions Weight Bearing Restrictions: No  Therapy/Group: Individual Therapy  Janvi Ammar PTA 08/14/2023, 12:10 PM

## 2023-08-14 NOTE — Progress Notes (Signed)
Physical Therapy Session Note  Patient Details  Name: Priscilla Wilson MRN: [id number] Date of Birth: [birth year]  Today's Date: 08/14/2023 PT Individual Time: 1131-1200 PT Individual Time Calculation (min): 29 min   Short Term Goals: Week 1:  PT Short Term Goal 1 (Week 1): Pt will perform functional standing transfers with light CGA using LRAD. PT Short Term Goal 2 (Week 1): Pt will ambulate community distances using LRAD with consistent supervision and min vc for technique. PT Short Term Goal 3 (Week 1): Pt will complete appropriate outcome measures.  Skilled Therapeutic Interventions/Progress Updates:    Pt presents in room seated EOB, agreeable to PT. Pt denies pain, reports having more sensation in LLE however reporting increased inversion and terminal knee extension in eariler session. Session focused on gait training for increased tolerance to upright mobility as well as NMR for LLE muscle fiber recruitment.  Pt ambulates to/from room to day room >300' with pt demonstrating good step length and foot placement consistency initially however demonstrates inconsistency with fatigue, occasionally hits RW with LLE. Pt does demonstrate very mild L knee hyperextension with fatigue.  Pt completes standing NMR no UE support to promote LLE muscle fiber recruitment in stance phase including: - sit<>stands yellow tband around knees x10 (cues to shift weight to LLE) - sit<>stands with RLE on airex pad x10 (cues for weightshifting) - step taps RLE 3x10 with cues for correction on balance with prolonged L SLS.  Pt returns to room and remains seated on EOB with all needs within reach, call light in place, and bed alarm activated at end of session.  Therapy Documentation Precautions:  Precautions Precautions: Fall Precaution Comments: L side hemipareisis with reduced coordination Restrictions Weight Bearing Restrictions: No  Therapy/Group: Individual Therapy  Edwin Cap PT, DPT 08/14/2023,  3:42 PM

## 2023-08-14 NOTE — Progress Notes (Signed)
PROGRESS NOTE   Subjective/Complaints:     ROS- neg CP, SOB, N/V/D Objective:   No results found. No results for input(s): "WBC", "HGB", "HCT", "PLT" in the last 72 hours.  No results for input(s): "NA", "K", "CL", "CO2", "GLUCOSE", "BUN", "CREATININE", "CALCIUM" in the last 72 hours.   Intake/Output Summary (Last 24 hours) at 08/14/2023 0745 Last data filed at 08/13/2023 1855 Gross per 24 hour  Intake 360 ml  Output --  Net 360 ml        Physical Exam: Vital Signs Blood pressure (!) 157/65, pulse 73, temperature 98 F (36.7 C), resp. rate 17, height 5' (1.524 m), weight 61.4 kg, SpO2 98%.   General: No acute distress Mood and affect are appropriate Heart: Regular rate and rhythm no rubs murmurs or extra sounds Lungs: Clear to auscultation, breathing unlabored, no rales or wheezes Abdomen: Positive bowel sounds, soft nontender to palpation, nondistended Extremities: No clubbing, cyanosis, or edema Skin: No evidence of breakdown, no evidence of rash Neurologic: Cranial nerves II through XII intact, motor strength is 5/5 in bilateral deltoid, bicep, tricep, grip, hip flexor, knee extensors, ankle dorsiflexor and plantar flexor Sensory exam normal sensation to light touch and proprioception in left  upper  Cerebellar exam mod dysmetria Left finger to nose to finger Musculoskeletal: Full range of motion in all 4 extremities. No joint swelling   Assessment/Plan: 1. Functional deficits which require 3+ hours per day of interdisciplinary therapy in a comprehensive inpatient rehab setting. Physiatrist is providing close team supervision and 24 hour management of active medical problems listed below. Physiatrist and rehab team continue to assess barriers to discharge/monitor patient progress toward functional and medical goals  Care Tool:  Bathing    Body parts bathed by patient: Right arm, Left arm, Chest, Abdomen,  Front perineal area, Buttocks, Right upper leg, Face, Left lower leg, Right lower leg, Left upper leg         Bathing assist Assist Level: Supervision/Verbal cueing     Upper Body Dressing/Undressing Upper body dressing   What is the patient wearing?: Bra, Pull over shirt    Upper body assist Assist Level: Minimal Assistance - Patient > 75%    Lower Body Dressing/Undressing Lower body dressing      What is the patient wearing?: Underwear/pull up, Pants     Lower body assist Assist for lower body dressing: Supervision/Verbal cueing     Toileting Toileting    Toileting assist Assist for toileting: Supervision/Verbal cueing     Transfers Chair/bed transfer  Transfers assist     Chair/bed transfer assist level: Contact Guard/Touching assist     Locomotion Ambulation   Ambulation assist      Assist level: Minimal Assistance - Patient > 75%   Max distance: 200 ft   Walk 10 feet activity   Assist     Assist level: Supervision/Verbal cueing Assistive device: Walker-rolling   Walk 50 feet activity   Assist    Assist level: Contact Guard/Touching assist      Walk 150 feet activity   Assist    Assist level: Contact Guard/Touching assist Assistive device: Walker-rolling    Walk 10 feet on uneven surface  activity   Assist Walk 10 feet on uneven surfaces activity did not occur: Safety/medical concerns         Wheelchair     Assist Is the patient using a wheelchair?: No Type of Wheelchair: Manual    Wheelchair assist level: Dependent - Patient 0% Max wheelchair distance: 200 ft    Wheelchair 50 feet with 2 turns activity    Assist        Assist Level: Dependent - Patient 0%   Wheelchair 150 feet activity     Assist      Assist Level: Dependent - Patient 0%   Blood pressure (!) 157/65, pulse 73, temperature 98 F (36.7 C), resp. rate 17, height 5' (1.524 m), weight 61.4 kg, SpO2 98%.  Medical Problem List  and Plan: 1. 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  Antithrombotics: -DVT/anticoagulation:  Pharmaceutical: Lovenox>start DVU neg for DVT, amb >200'  d/c lovenox              -antiplatelet therapy: Aspirin and Plavix for three weeks followed by aspirin alone (stop Plavix 10/17)   3. Pain Management: Tylenol as needed   4. Mood/Behavior/Sleep: LCSW to evaluate and provide emotional support             -pt appears positive and up beat. Wants to regain independence!             -continue Effexor-XR 75 mg q AM             -antipsychotic agents: n/a   5. Neuropsych/cognition: This patient is capable of making decisions on her own behalf.   6. Skin/Wound Care: Routine skin care checks   7. Fluids/Electrolytes/Nutrition: Routine Is and Os and follow-up chemistries   8: Hypertension: monitor TID and prn             -continue Lopressor 25 mg BID   Vitals:   08/13/23 2019 08/14/23 0514  BP: (!) 178/61 (!) 157/65  Pulse: 66 73  Resp: 18 17  Temp: 97.6 F (36.4 C) 98 F (36.7 C)  SpO2: 100% 98%  Elevated  10/4- may need to increase lopressor if HR is >70  9: Hyperlipidemia: continue statin- LDL is 88 at goal    10: GERD: continue home famotidine   11: Hyperthyroidism with palpitations: continue methimazole and BB   12: History of asthma: continue albuterol MDI as needed   13: Prediabetes: carb modified diet- resume reg diet               -sugars have been well controlled. Probably can dc cbg's within the next 24 hours if they remain stable  CBG (last 3)  No results for input(s): "GLUCAP" in the last 72 hours.  May d/c 10/2        LOS: 4 days A FACE TO FACE EVALUATION WAS PERFORMED  Erick Colace 08/14/2023, 7:45 AM

## 2023-08-14 NOTE — Progress Notes (Signed)
Occupational Therapy Session Note  Patient Details  Name: Atticus Lemberger MRN: [id number] Date of Birth: [birth year]  Today's Date: 08/14/2023 OT Individual Time: 0945-1100 OT Individual Time Calculation (min): 75 min    Short Term Goals: Week 1:  OT Short Term Goal 1 (Week 1): Pt will demonstrate improved L hand coordination to fasten and unfasten her bra. OT Short Term Goal 2 (Week 1): Pt will be able to use L hand to hold fork when cutting meat with R hand. OT Short Term Goal 3 (Week 1): pt will be able to use L hand to place food items in kitchen pantry.  Skilled Therapeutic Interventions/Progress Updates:    Pt received in recliner ready to participate in therapy. Pt engage in self care routine (toileting, shower, dressing)  with supervision with RW.  Pt is now using left hand actively but slowly due to impaired sensation. She was able to fasten her bra today.  She ambulated to ortho gym and sat on mat to engage in L hand strengthening and coordination using 3 lb dowel bar with arm reaches and shoulder presses.   She felt the vibrating massager did help as she feels more in her upper arm. Used massager along L arm and hand to stimulate sensation. Pt ambulated back to her room with RW and sat in recliner. Chair pad alarm on and all needs met.     Therapy Documentation Precautions:  Precautions Precautions: Fall Precaution Comments: L side hemipareisis with reduced coordination Restrictions Weight Bearing Restrictions: No    Vital Signs: Therapy Vitals Temp: 98 F (36.7 C) Pulse Rate: 73 Resp: 17 BP: (!) 157/65 Patient Position (if appropriate): Lying Oxygen Therapy SpO2: 98 % O2 Device: Room Air Pain: Pain Assessment Pain Scale: 0-10 Pain Score: 0-No pain ADL: ADL ADL Comments: supervision overall except min A to fasten bra and CGA with ADL transfers  Therapy/Group: Individual Therapy  Lawerence Dery 08/14/2023, 8:49 AM

## 2023-08-14 NOTE — Plan of Care (Signed)
  Problem: Consults Goal: RH STROKE PATIENT EDUCATION Description: See Patient Education module for education specifics  Outcome: Progressing   Problem: RH BOWEL ELIMINATION Goal: RH STG MANAGE BOWEL WITH ASSISTANCE Description: STG Manage Bowel with  mod I Assistance. Outcome: Progressing Goal: RH STG MANAGE BOWEL W/MEDICATION W/ASSISTANCE Description: STG Manage Bowel with Medication with mod I Assistance. Outcome: Progressing   Problem: RH SAFETY Goal: RH STG ADHERE TO SAFETY PRECAUTIONS W/ASSISTANCE/DEVICE Description: STG Adhere to Safety Precautions With  cues Assistance/Device. Outcome: Progressing   Problem: RH KNOWLEDGE DEFICIT Goal: RH STG INCREASE KNOWLEDGE OF DIABETES Description: Patient and spouse will be able to manage prediabetes with dietary modification using educational resources independently Outcome: Progressing Goal: RH STG INCREASE KNOWLEDGE OF HYPERTENSION Description: Patient and spouse will be able to manage HTN with medications and dietary modification using educational resources independently Outcome: Progressing Goal: RH STG INCREASE KNOWLEGDE OF HYPERLIPIDEMIA Description: Patient and spouse will be able to manage HLD with medications and dietary modification using educational resources independently Outcome: Progressing Goal: RH STG INCREASE KNOWLEDGE OF STROKE PROPHYLAXIS Description: Patient and spouse will be able to manage secondary risks with medications and dietary modification using educational resources independently Outcome: Progressing   Problem: Education: Goal: Knowledge of disease or condition will improve Outcome: Progressing Goal: Knowledge of secondary prevention will improve (MUST DOCUMENT ALL) Outcome: Progressing Goal: Knowledge of patient specific risk factors will improve Loraine Leriche N/A or DELETE if not current risk factor) Outcome: Progressing   Problem: Ischemic Stroke/TIA Tissue Perfusion: Goal: Complications of ischemic  stroke/TIA will be minimized Outcome: Progressing   Problem: Coping: Goal: Will verbalize positive feelings about self Outcome: Progressing Goal: Will identify appropriate support needs Outcome: Progressing   Problem: Health Behavior/Discharge Planning: Goal: Ability to manage health-related needs will improve Outcome: Progressing Goal: Goals will be collaboratively established with patient/family Outcome: Progressing   Problem: Self-Care: Goal: Ability to participate in self-care as condition permits will improve Outcome: Progressing Goal: Verbalization of feelings and concerns over difficulty with self-care will improve Outcome: Progressing Goal: Ability to communicate needs accurately will improve Outcome: Progressing   Problem: Nutrition: Goal: Risk of aspiration will decrease Outcome: Progressing Goal: Dietary intake will improve Outcome: Progressing

## 2023-08-14 NOTE — Progress Notes (Signed)
Patient ID: Priscilla Wilson, female   DOB: 11/15/1950, 72 y.o.   MRN: [id number]  [facility].

## 2023-08-15 DIAGNOSIS — I10 Essential (primary) hypertension: Secondary | ICD-10-CM | POA: Diagnosis not present

## 2023-08-15 DIAGNOSIS — I639 Cerebral infarction, unspecified: Secondary | ICD-10-CM | POA: Diagnosis not present

## 2023-08-15 NOTE — Progress Notes (Signed)
Physical Therapy Session Note  Patient Details  Name: Jagger Beahm MRN: [id number] Date of Birth: Aug 03, 1951  Today's Date: 08/15/2023 PT Individual Time: 8295-6213 PT Individual Time Calculation (min): 42 min   Short Term Goals: Week 1:  PT Short Term Goal 1 (Week 1): Pt will perform functional standing transfers with light CGA using LRAD. PT Short Term Goal 2 (Week 1): Pt will ambulate community distances using LRAD with consistent supervision and min vc for technique. PT Short Term Goal 3 (Week 1): Pt will complete appropriate outcome measures.  Skilled Therapeutic Interventions/Progress Updates:  Pt was seen bedside in the pm. Pt performed all sit to stand transfers with S. Pt ambulated 150 feet x 2 and 80 feet x 2 with rolling walker and contact guard assist. In gym treatment focused on NMR. During gait fluctuates between use of circumduction, increased hip flex and normal pattern to advance L LE. Pt performed hip flex and LAQs with 1.5 lbs on L LE to increased proprioceptive feedback. Pt performed cone taps 3 sets x 10 reps each with 1.5 lbs on L LE to increase proprioceptive feedback. Pt returned to room and left sitting up in recliner with all needs within reach.   Therapy Documentation Precautions:  Precautions Precautions: Fall Precaution Comments: L side hemipareisis with reduced coordination Restrictions Weight Bearing Restrictions: No General:   Pain: No c/o pain.   Therapy/Group: Individual Therapy  Rayford Halsted 08/15/2023, 3:58 PM

## 2023-08-15 NOTE — Progress Notes (Signed)
Occupational Therapy Session Note  Patient Details  Name: Priscilla Wilson MRN: [id number] Date of Birth: [birth year]  Today's Date: 08/15/2023 OT Individual Time: 1120-1200 OT Individual Time Calculation (min): 40 min    Short Term Goals: Week 1:  OT Short Term Goal 1 (Week 1): Pt will demonstrate improved L hand coordination to fasten and unfasten her bra. OT Short Term Goal 2 (Week 1): Pt will be able to use L hand to hold fork when cutting meat with R hand. OT Short Term Goal 3 (Week 1): pt will be able to use L hand to place food items in kitchen pantry.  Skilled Therapeutic Interventions/Progress Updates:     (1st) OT Treatment Session:  Patient seated in the recliner, at the time of arrival in agreement with complete UB strengthening, neuromuscular reeducation, and static/dynamic balance exercises. The pt began by completing UB execises using the 2lb dowel for shld flexion, horizontal abduction, shld rotation, and lg circle 1 set of 15 with rest breaks as necessary.  The pt required 1 rest break with each exercise. The pt was able to ambulate to the bathroom door using the RW  to  complete wall pusn-up in standing  2 sets of 15 with breaks as needed.  Afterwards,  the pt ambulated to the recliner and required 1 rest break.  The pt went on to complete ball toss exercise with focus on symmetry of BUE using a 2lb ball and tossing it to the examiner and catching it in standing with a chair positioned in the rear of her for a safe return to sit.  The pt completed the session by performing finger opposition exercise to improve bilateral hand manipulation and coordination .  Following OT treatment,  the pt remained in the recliner with her call light and bedside table within reach.  All additional needs were addressed prior to exiting the room. .      (2nd ) OT Treatment Session:  The pt completed a ball execise maintaining the ball in shld flexion and horizontal abduction 1 set of 10 with attention  to symmetry for BUE.  The pt went on to retrieve the squize from a mirror in standing using her LUE for grasping to improve bilateral hand manipulation. The final task completed by the pt was the  [employer] for 15 minutes in duration with 1 rest break.  The pt ambulated  back to her room  using the RW and was able to sit in the recliner with her call light and bedside table within reach.  All additional needs were addressed prior to exiting the room.  Therapy Documentation Precautions:  Precautions Precautions: Fall Precaution Comments: L side hemipareisis with reduced coordination Restrictions Weight Bearing Restrictions: No   Therapy/Group: Individual Therapy  Lavona Mound 08/15/2023, 12:46 PM

## 2023-08-15 NOTE — Progress Notes (Signed)
Physical Therapy Session Note  Patient Details  Name: Priscilla Wilson MRN: [id number] Date of Birth: 1951/08/04  Today's Date: 08/15/2023 PT Individual Time: 0922-1035 PT Individual Time Calculation (min): 73 min   Short Term Goals: Week 1:  PT Short Term Goal 1 (Week 1): Pt will perform functional standing transfers with light CGA using LRAD. PT Short Term Goal 2 (Week 1): Pt will ambulate community distances using LRAD with consistent supervision and min vc for technique. PT Short Term Goal 3 (Week 1): Pt will complete appropriate outcome measures.  Skilled Therapeutic Interventions/Progress Updates: Patient sitting in recliner on entrance to room. Patient alert and agreeable to PT session.   Patient reported no paint. Pt dependently transported outside of [facility] in [facility] for PT session.  Therapeutic Activity: Transfers: Pt performed sit<>stand transfers throughout session with supervision. Provided VC to ensure safe distance to sit (back of knee on surface).  Gait Training:  Pt ambulated 300'+ x 2 using RW with supervision/CGA for safety. Pt demonstrated the following gait deviations with therapist providing the described cuing and facilitation for improvement:  - Pt provided with questioning cues to recall previous cues from sessions regarding correcting deficits in L LE. Pt able to recall most and required minA for the others (eversion at L ankle, heel-toe, soft bend in L knee, and coordinating steps in a straight line vs ataxic movements from slight hip abduction and adduction during gait cycle).  - Pt required CGA towards end of ambulatory trials due to fatigue and safety as pt presented with decrease in maintaining cues listed above.  Neuromuscular Re-ed: NMR facilitated during session with focus on dynamic standing balance, L LE coordination, dual-tasks. - Pt stepping over stick to lightly touch ground on either side while balancing a small piece of wood mulch on front of foot in order  to promote controlled L LE coordination. Pt with ataxic movements and required minor adjustments to find right angle to sit at to perform task, as well as increasing L hip flexion with slower movements to either side. Pt progressed to calling out random flowers with PTA providing a letter of the alphabet for pt to recall to increase dual-task challenge (pt able to do so with little help). - Pt ambulated in main gym with R HHA with no VC provided as to have pt self assess balance and deficits on her own. Pt ambulated 40' with 4 180* turns. Pt reported feeling off balance (minA required and with some WB on hand) and that R LE now felt off and uncoordinated and that she felt like she was thinking too much about it.  - pt transported to ortho gym for privacy while PTA played The Beatles while pt humming and walking around ortho gym to distract from ambulation. Pt reported feeling more confident in balance and steps (light minA with less WB on hand). Pt also with neutral steps (not inversion/supination) and good step length vs long length. Pt did have some hyperextension at end of trial and reported feeling it when it happened. Pt transported back to room in [facility] for time management.  NMR performed for improvements in motor control and coordination, balance, sequencing, judgement, and self confidence/ efficacy in performing all aspects of mobility at highest level of independence.   Patient sitting in reclienr at end of session with brakes locked, chair alarm set, and all needs within reach.      Therapy Documentation Precautions:  Precautions Precautions: Fall Precaution Comments: L side hemipareisis with reduced coordination Restrictions  Weight Bearing Restrictions: No  Therapy/Group: Individual Therapy  Thoams Siefert PTA 08/15/2023, 12:44 PM

## 2023-08-15 NOTE — Progress Notes (Signed)
PROGRESS NOTE   Subjective/Complaints:  Pt feeling ok, slept ok, denies pain, LBM this morning, urinating fine, denies any other complaints or concerns.    ROS- neg CP, SOB, N/V/D  Objective:   No results found. No results for input(s): "WBC", "HGB", "HCT", "PLT" in the last 72 hours.  No results for input(s): "NA", "K", "CL", "CO2", "GLUCOSE", "BUN", "CREATININE", "CALCIUM" in the last 72 hours.   Intake/Output Summary (Last 24 hours) at 08/15/2023 0818 Last data filed at 08/15/2023 0804 Gross per 24 hour  Intake 360 ml  Output --  Net 360 ml        Physical Exam: Vital Signs Blood pressure 135/72, pulse 66, temperature 98.4 F (36.9 C), resp. rate 18, height 5' (1.524 m), weight 61.4 kg, SpO2 98%.   General: No acute distress, sitting in chair Mood and affect are appropriate Heart: Regular rate and rhythm no rubs murmurs or extra sounds Lungs: Clear to auscultation, breathing unlabored, no rales or wheezes Abdomen: Positive bowel sounds, soft nontender to palpation, nondistended Extremities: No clubbing, cyanosis, or edema Skin: No evidence of breakdown, no evidence of rash over exposed surfaces  PRIOR EXAMS: Neurologic: Cranial nerves II through XII intact, motor strength is 5/5 in bilateral deltoid, bicep, tricep, grip, hip flexor, knee extensors, ankle dorsiflexor and plantar flexor Sensory exam normal sensation to light touch and proprioception in left  upper  Cerebellar exam mod dysmetria Left finger to nose to finger  Musculoskeletal: Full range of motion in all 4 extremities. No joint swelling   Assessment/Plan: 1. Functional deficits which require 3+ hours per day of interdisciplinary therapy in a comprehensive inpatient rehab setting. Physiatrist is providing close team supervision and 24 hour management of active medical problems listed below. Physiatrist and rehab team continue to assess barriers to  discharge/monitor patient progress toward functional and medical goals  Care Tool:  Bathing    Body parts bathed by patient: Right arm, Left arm, Chest, Abdomen, Front perineal area, Buttocks, Right upper leg, Face, Left lower leg, Right lower leg, Left upper leg         Bathing assist Assist Level: Supervision/Verbal cueing     Upper Body Dressing/Undressing Upper body dressing   What is the patient wearing?: Bra, Pull over shirt    Upper body assist Assist Level: Supervision/Verbal cueing    Lower Body Dressing/Undressing Lower body dressing      What is the patient wearing?: Underwear/pull up, Pants     Lower body assist Assist for lower body dressing: Supervision/Verbal cueing     Toileting Toileting    Toileting assist Assist for toileting: Supervision/Verbal cueing     Transfers Chair/bed transfer  Transfers assist     Chair/bed transfer assist level: Supervision/Verbal cueing     Locomotion Ambulation   Ambulation assist      Assist level: Minimal Assistance - Patient > 75%   Max distance: 200 ft   Walk 10 feet activity   Assist     Assist level: Supervision/Verbal cueing Assistive device: Walker-rolling   Walk 50 feet activity   Assist    Assist level: Contact Guard/Touching assist      Walk 150 feet activity  Assist    Assist level: Contact Guard/Touching assist Assistive device: Walker-rolling    Walk 10 feet on uneven surface  activity   Assist Walk 10 feet on uneven surfaces activity did not occur: Safety/medical concerns         Wheelchair     Assist Is the patient using a wheelchair?: Yes (per PT report to this Clinical research associate. (error in documentation *no changed to YES).) Type of Wheelchair: Manual    Wheelchair assist level: Dependent - Patient 0% Max wheelchair distance: 200 ft    Wheelchair 50 feet with 2 turns activity    Assist        Assist Level: Dependent - Patient 0%   Wheelchair 150  feet activity     Assist      Assist Level: Dependent - Patient 0%   Blood pressure 135/72, pulse 66, temperature 98.4 F (36.9 C), resp. rate 18, height 5' (1.524 m), weight 61.4 kg, SpO2 98%.  Medical Problem List and Plan: 1. 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  Antithrombotics: -DVT/anticoagulation:  Pharmaceutical: Lovenox>start DVU neg for DVT, amb >200'  d/c lovenox              -antiplatelet therapy: Aspirin and Plavix for three weeks followed by aspirin alone (stop Plavix 10/17)   3. Pain Management: Tylenol as needed   4. Mood/Behavior/Sleep: LCSW to evaluate and provide emotional support             -pt appears positive and up beat. Wants to regain independence!             -continue Effexor-XR 75 mg q AM             -antipsychotic agents: n/a   5. Neuropsych/cognition: This patient is capable of making decisions on her own behalf.   6. Skin/Wound Care: Routine skin care checks   7. Fluids/Electrolytes/Nutrition: Routine Is and Os and follow-up chemistries   8: Hypertension: monitor TID and prn             -continue Lopressor 25 mg BID  Elevated  10/4- may need to increase lopressor if HR is >70  -08/15/23 BPs a bit better, monitor for now.  Vitals:   08/14/23 2014 08/15/23 0516  BP: 136/69 135/72  Pulse: 70 66  Resp: 17 18  Temp: 98 F (36.7 C) 98.4 F (36.9 C)  SpO2: 96% 98%   Vitals:   08/11/23 1532 08/11/23 1951 08/12/23 0517 08/12/23 1614  BP: (!) 143/69 (!) 147/68 (!) 156/65 (!) 145/75   08/12/23 1958 08/13/23 0524 08/13/23 1539 08/13/23 2019  BP: 139/76 123/74 133/83 (!) 178/61   08/14/23 0514 08/14/23 1324 08/14/23 2014 08/15/23 0516  BP: (!) 157/65 (!) 146/70 136/69 135/72     9: Hyperlipidemia: continue statin- LDL is 88 at goal    10:  GERD: continue home famotidine   11: Hyperthyroidism with palpitations: continue methimazole and BB   12: History of asthma: continue albuterol MDI as needed   13: Prediabetes: carb modified diet- resume reg diet               -sugars have been well controlled. Probably can dc cbg's within the next  24 hours if they remain stable May d/c 10/2        LOS: 5 days A FACE TO FACE EVALUATION WAS PERFORMED  [address] 08/15/2023, 8:18 AM

## 2023-08-16 DIAGNOSIS — I639 Cerebral infarction, unspecified: Secondary | ICD-10-CM | POA: Diagnosis not present

## 2023-08-16 DIAGNOSIS — I10 Essential (primary) hypertension: Secondary | ICD-10-CM | POA: Diagnosis not present

## 2023-08-16 NOTE — Progress Notes (Signed)
PROGRESS NOTE   Subjective/Complaints:  Pt feeling good. Slept well, denies pain, LBM this morning, urinating fine, denies any other complaints or concerns.    ROS- neg CP, SOB, N/V/D  Objective:   No results found. No results for input(s): "WBC", "HGB", "HCT", "PLT" in the last 72 hours.  No results for input(s): "NA", "K", "CL", "CO2", "GLUCOSE", "BUN", "CREATININE", "CALCIUM" in the last 72 hours.   Intake/Output Summary (Last 24 hours) at 08/16/2023 0749 Last data filed at 08/15/2023 1840 Gross per 24 hour  Intake 594 ml  Output --  Net 594 ml        Physical Exam: Vital Signs Blood pressure (!) 142/75, pulse 62, temperature 98.1 F (36.7 C), temperature source Oral, resp. rate 18, height 5' (1.524 m), weight 61.4 kg, SpO2 98%.   General: No acute distress, sitting in chair talking on the phone Mood and affect are appropriate Heart: Regular rate and rhythm no rubs murmurs or extra sounds Lungs: Clear to auscultation, breathing unlabored, no rales or wheezes Abdomen: Positive bowel sounds, soft nontender to palpation, nondistended Extremities: No clubbing, cyanosis, or edema Skin: No evidence of breakdown, no evidence of rash over exposed surfaces MsK: moving extremities antegravity  PRIOR EXAMS: Neurologic: Cranial nerves II through XII intact, motor strength is 5/5 in bilateral deltoid, bicep, tricep, grip, hip flexor, knee extensors, ankle dorsiflexor and plantar flexor Sensory exam normal sensation to light touch and proprioception in left  upper  Cerebellar exam mod dysmetria Left finger to nose to finger  Musculoskeletal: Full range of motion in all 4 extremities. No joint swelling   Assessment/Plan: 1. Functional deficits which require 3+ hours per day of interdisciplinary therapy in a comprehensive inpatient rehab setting. Physiatrist is providing close team supervision and 24 hour management of  active medical problems listed below. Physiatrist and rehab team continue to assess barriers to discharge/monitor patient progress toward functional and medical goals  Care Tool:  Bathing    Body parts bathed by patient: Right arm, Left arm, Chest, Abdomen, Front perineal area, Buttocks, Right upper leg, Face, Left lower leg, Right lower leg, Left upper leg         Bathing assist Assist Level: Supervision/Verbal cueing     Upper Body Dressing/Undressing Upper body dressing   What is the patient wearing?: Bra, Pull over shirt    Upper body assist Assist Level: Supervision/Verbal cueing    Lower Body Dressing/Undressing Lower body dressing      What is the patient wearing?: Underwear/pull up, Pants     Lower body assist Assist for lower body dressing: Supervision/Verbal cueing     Toileting Toileting    Toileting assist Assist for toileting: Supervision/Verbal cueing     Transfers Chair/bed transfer  Transfers assist     Chair/bed transfer assist level: Supervision/Verbal cueing     Locomotion Ambulation   Ambulation assist      Assist level: Contact Guard/Touching assist   Max distance: 150   Walk 10 feet activity   Assist     Assist level: Contact Guard/Touching assist Assistive device: Walker-rolling   Walk 50 feet activity   Assist    Assist level: Contact Guard/Touching assist  Assistive device: Walker-rolling    Walk 150 feet activity   Assist    Assist level: Contact Guard/Touching assist Assistive device: Walker-rolling    Walk 10 feet on uneven surface  activity   Assist Walk 10 feet on uneven surfaces activity did not occur: Safety/medical concerns         Wheelchair     Assist Is the patient using a wheelchair?: Yes (per PT report to this Clinical research associate. (error in documentation *no changed to YES).) Type of Wheelchair: Manual    Wheelchair assist level: Dependent - Patient 0% Max wheelchair distance: 200 ft     Wheelchair 50 feet with 2 turns activity    Assist        Assist Level: Dependent - Patient 0%   Wheelchair 150 feet activity     Assist      Assist Level: Dependent - Patient 0%   Blood pressure (!) 142/75, pulse 62, temperature 98.1 F (36.7 C), temperature source Oral, resp. rate 18, height 5' (1.524 m), weight 61.4 kg, SpO2 98%.  Medical Problem List and Plan: 1. 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  Antithrombotics: -DVT/anticoagulation:  Pharmaceutical: Lovenox>start DVU neg for DVT, amb >200'  d/c lovenox              -antiplatelet therapy: Aspirin and Plavix for three weeks followed by aspirin alone (stop Plavix 10/17)   3. Pain Management: Tylenol as needed   4. Mood/Behavior/Sleep: LCSW to evaluate and provide emotional support             -pt appears positive and up beat. Wants to regain independence!             -continue Effexor-XR 75 mg q AM             -antipsychotic agents: n/a   5. Neuropsych/cognition: This patient is capable of making decisions on her own behalf.   6. Skin/Wound Care: Routine skin care checks   7. Fluids/Electrolytes/Nutrition: Routine Is and Os and follow-up chemistries   8: Hypertension: monitor TID and prn             -continue Lopressor 25 mg BID  Elevated  10/4- may need to increase lopressor if HR is >70  -08/15/23 BPs a bit better, monitor for now.   -08/16/23 BP variable but mostly 130s SBP; HR variable 60-70s; will hold off on changing lopressor for now, BPs are mostly stable.  Vitals:   08/15/23 1832 08/16/23 0557  BP: (!) 146/75 (!) 142/75  Pulse: 75 62  Resp: 17 18  Temp: 97.9 F (36.6 C) 98.1 F (36.7 C)  SpO2: 96% 98%   Vitals:   08/12/23 1614 08/12/23 1958 08/13/23 0524 08/13/23 1539  BP: (!) 145/75  139/76 123/74 133/83   08/13/23 2019 08/14/23 0514 08/14/23 1324 08/14/23 2014  BP: (!) 178/61 (!) 157/65 (!) 146/70 136/69   08/15/23 0516 08/15/23 1244 08/15/23 1832 08/16/23 0557  BP: 135/72 138/68 (!) 146/75 (!) 142/75     9: Hyperlipidemia: continue statin- LDL is 88 at goal    10: GERD: continue home famotidine   11: Hyperthyroidism with palpitations: continue methimazole and BB   12: History of asthma:  continue albuterol MDI as needed   13: Prediabetes: carb modified diet- resume reg diet               -sugars have been well controlled. Probably can dc cbg's within the next 24 hours if they remain stable May d/c 10/2        LOS: 6 days A FACE TO FACE EVALUATION WAS PERFORMED  [address] 08/16/2023, 7:49 AM

## 2023-08-17 DIAGNOSIS — I639 Cerebral infarction, unspecified: Secondary | ICD-10-CM | POA: Diagnosis not present

## 2023-08-17 LAB — BASIC METABOLIC PANEL
Anion gap: 11 (ref 5–15)
BUN: 23 mg/dL (ref 8–23)
CO2: 21 mmol/L — ABNORMAL LOW (ref 22–32)
Calcium: 9.6 mg/dL (ref 8.9–10.3)
Chloride: 106 mmol/L (ref 98–111)
Creatinine, Ser: 1.03 mg/dL — ABNORMAL HIGH (ref 0.44–1.00)
GFR, Estimated: 57 mL/min — ABNORMAL LOW (ref >60)
Glucose, Bld: 126 mg/dL — ABNORMAL HIGH (ref 70–99)
Potassium: 4.3 mmol/L (ref 3.5–5.1)
Sodium: 138 mmol/L (ref 135–145)

## 2023-08-17 LAB — CBC
HCT: 45.3 % (ref 36.0–46.0)
Hemoglobin: 14.6 g/dL (ref 12.0–15.0)
MCH: 27.9 pg (ref 26.0–34.0)
MCHC: 32.2 g/dL (ref 30.0–36.0)
MCV: 86.5 fL (ref 80.0–100.0)
Platelets: 240 10*3/uL (ref 150–400)
RBC: 5.24 MIL/uL — ABNORMAL HIGH (ref 3.87–5.11)
RDW: 13.2 % (ref 11.5–15.5)
WBC: 7 10*3/uL (ref 4.0–10.5)
nRBC: 0 % (ref 0.0–0.2)

## 2023-08-17 NOTE — Progress Notes (Signed)
Patient ID: Priscilla Wilson, female   DOB: [birth year], 72 y.o.   MRN: [id number]  [employer] ordered through [employer].

## 2023-08-17 NOTE — Progress Notes (Signed)
Physical Therapy Session Note  Patient Details  Name: [name] MRN: [id number] Date of Birth: Sep 17, 1951  Today's Date: 08/17/2023 PT Individual Time: 1117-1200 PT Individual Time Calculation (min): 43 min   Short Term Goals: Week 1:  PT Short Term Goal 1 (Week 1): Pt will perform functional standing transfers with light CGA using LRAD. PT Short Term Goal 2 (Week 1): Pt will ambulate community distances using LRAD with consistent supervision and min vc for technique. PT Short Term Goal 3 (Week 1): Pt will complete appropriate outcome measures. Week 2:     Skilled Therapeutic Interventions/Progress Updates:      Therapy Documentation Precautions:  Precautions Precautions: Fall Precaution Comments: L side hemipareisis with reduced coordination Restrictions Weight Bearing Restrictions: No General:   Vital Signs: Therapy Vitals Temp: 97.9 F (36.6 C) Pulse Rate: 69 Resp: 18 BP: 126/61 Patient Position (if appropriate): Sitting Oxygen Therapy SpO2: 100 % O2 Device: Room Air Pain:   Mobility:   Locomotion :    Trunk/Postural Assessment :    Balance:   Exercises:   Other Treatments:      Therapy/Group: Individual Therapy  Loel Dubonnet PT, DPT, CSRS 08/17/2023, 5:26 PM

## 2023-08-17 NOTE — Progress Notes (Signed)
PROGRESS NOTE   Subjective/Complaints: No new complaints this morning Discussed slight increase in creatinine and encouraged oral hydration, discussed that other labs are stable   ROS- denies CP, SOB, N/V/D  Objective:   No results found. Recent Labs    08/17/23 0727  WBC 7.0  HGB 14.6  HCT 45.3  PLT 240    Recent Labs    08/17/23 0727  NA 138  K 4.3  CL 106  CO2 21*  GLUCOSE 126*  BUN 23  CREATININE 1.03*  CALCIUM 9.6     Intake/Output Summary (Last 24 hours) at 08/17/2023 1017 Last data filed at 08/17/2023 0800 Gross per 24 hour  Intake 714 ml  Output --  Net 714 ml        Physical Exam: Vital Signs Blood pressure (!) 149/68, pulse (!) 58, temperature 98.1 F (36.7 C), temperature source Oral, resp. rate 18, height 5' (1.524 m), weight 61.4 kg, SpO2 100%.   General: No acute distress, sitting in chair talking on the phone Mood and affect are appropriate Heart: Bradycardia Lungs: Clear to auscultation, breathing unlabored, no rales or wheezes Abdomen: Positive bowel sounds, soft nontender to palpation, nondistended Extremities: No clubbing, cyanosis, or edema Skin: No evidence of breakdown, no evidence of rash over exposed surfaces MsK: moving extremities antegravity  PRIOR EXAMS: Neurologic: Cranial nerves II through XII intact, motor strength is 5/5 in bilateral deltoid, bicep, tricep, grip, hip flexor, knee extensors, ankle dorsiflexor and plantar flexor Sensory exam normal sensation to light touch and proprioception in left  upper  Cerebellar exam mod dysmetria Left finger to nose to finger  Musculoskeletal: Full range of motion in all 4 extremities. No joint swelling   Assessment/Plan: 1. Functional deficits which require 3+ hours per day of interdisciplinary therapy in a comprehensive inpatient rehab setting. Physiatrist is providing close team supervision and 24 hour management of  active medical problems listed below. Physiatrist and rehab team continue to assess barriers to discharge/monitor patient progress toward functional and medical goals  Care Tool:  Bathing    Body parts bathed by patient: Right arm, Left arm, Chest, Abdomen, Front perineal area, Buttocks, Right upper leg, Face, Left lower leg, Right lower leg, Left upper leg         Bathing assist Assist Level: Supervision/Verbal cueing     Upper Body Dressing/Undressing Upper body dressing   What is the patient wearing?: Bra, Pull over shirt    Upper body assist Assist Level: Supervision/Verbal cueing    Lower Body Dressing/Undressing Lower body dressing      What is the patient wearing?: Underwear/pull up, Pants     Lower body assist Assist for lower body dressing: Supervision/Verbal cueing     Toileting Toileting    Toileting assist Assist for toileting: Supervision/Verbal cueing     Transfers Chair/bed transfer  Transfers assist     Chair/bed transfer assist level: Supervision/Verbal cueing     Locomotion Ambulation   Ambulation assist      Assist level: Contact Guard/Touching assist   Max distance: 150   Walk 10 feet activity   Assist     Assist level: Contact Guard/Touching assist Assistive device: Walker-rolling  Walk 50 feet activity   Assist    Assist level: Contact Guard/Touching assist Assistive device: Walker-rolling    Walk 150 feet activity   Assist    Assist level: Contact Guard/Touching assist Assistive device: Walker-rolling    Walk 10 feet on uneven surface  activity   Assist Walk 10 feet on uneven surfaces activity did not occur: Safety/medical concerns         Wheelchair     Assist Is the patient using a wheelchair?: Yes (per PT report to this Clinical research associate. (error in documentation *no changed to YES).) Type of Wheelchair: Manual    Wheelchair assist level: Dependent - Patient 0% Max wheelchair distance: 200 ft     Wheelchair 50 feet with 2 turns activity    Assist        Assist Level: Dependent - Patient 0%   Wheelchair 150 feet activity     Assist      Assist Level: Dependent - Patient 0%   Blood pressure (!) 149/68, pulse (!) 58, temperature 98.1 F (36.7 C), temperature source Oral, resp. rate 18, height 5' (1.524 m), weight 61.4 kg, SpO2 100%.  Medical Problem List and Plan: 1. 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  Antithrombotics: -DVT/anticoagulation:  Pharmaceutical: Lovenox>start DVU neg for DVT, amb >200'  d/c lovenox              -antiplatelet therapy: Aspirin and Plavix for three weeks followed by aspirin alone (stop Plavix 10/17)   3. Pain Management: Tylenol as needed   4. Mood/Behavior/Sleep: LCSW to evaluate and provide emotional support             -pt appears positive and up beat. Wants to regain independence!             -continue Effexor-XR 75 mg q AM             -antipsychotic agents: n/a   5. Neuropsych/cognition: This patient is capable of making decisions on her own behalf.   6. Skin/Wound Care: Routine skin care checks   7. Fluids/Electrolytes/Nutrition: Routine Is and Os and follow-up chemistries   8: Hypertension: monitor TID and prn             -continue Lopressor 25 mg BID  Elevated  10/4- may need to increase lopressor if HR is >70  -08/15/23 BPs a bit better, monitor for now.   -08/16/23 BP variable but mostly 130s SBP; HR variable 60-70s; will hold off on changing lopressor for now, BPs are mostly stable.  Vitals:   08/16/23 1924 08/17/23 0303  BP: 139/76 (!) 149/68  Pulse: 66 (!) 58  Resp: 18 18  Temp: 97.7 F (36.5 C) 98.1 F (36.7 C)  SpO2: 98% 100%   Vitals:   08/13/23 1539 08/13/23 2019 08/14/23 0514 08/14/23 1324  BP: 133/83  (!) 178/61 (!) 157/65 (!) 146/70   08/14/23 2014 08/15/23 0516 08/15/23 1244 08/15/23 1832  BP: 136/69 135/72 138/68 (!) 146/75   08/16/23 0557 08/16/23 1300 08/16/23 1924 08/17/23 0303  BP: (!) 142/75 (!) 140/77 139/76 (!) 149/68     9: Hyperlipidemia: continue statin- LDL is 88 at goal    10: GERD: continue home  famotidine   11: Hyperthyroidism with palpitations: continue methimazole and BB   12: History of asthma: continue albuterol MDI as needed   13: Prediabetes: carb modified diet- continue reg diet               -sugars have been well controlled. Probably can dc cbg's within the next 24 hours if they remain stable May d/c 10/2  14. AKI: encouraged oral hydration        LOS: 7 days A FACE TO FACE EVALUATION WAS PERFORMED  Clint Bolder P Dustan Hyams 08/17/2023, 10:17 AM

## 2023-08-17 NOTE — Progress Notes (Signed)
Occupational Therapy Session Note  Patient Details  Name: Priscilla Wilson MRN: [id number] Date of Birth: 01-18-1951  Today's Date: 08/17/2023 OT Individual Time: 1004-1059 session 1 OT Individual Time Calculation (min): 55 min  Session 2: 1305-1400 Session 3: 1503-1550   Short Term Goals: Week 1:  OT Short Term Goal 1 (Week 1): Pt will demonstrate improved L hand coordination to fasten and unfasten her bra. OT Short Term Goal 2 (Week 1): Pt will be able to use L hand to hold fork when cutting meat with R hand. OT Short Term Goal 3 (Week 1): pt will be able to use L hand to place food items in kitchen pantry.  Skilled Therapeutic Interventions/Progress Updates:  Session 1: Pt greeted seated in recliner, pt agreeable to OT intervention.   No c/o pain during session.    Transfers/bed mobility: pt completed sit>stands and functional mobility with RW and CGA.   NMR:  Pt completed various NMR tasks to LUE/LLE to facilitate improved AROM, coordination, motor planning and attention.  - pt completed Dynamic reaching task with horseshoes with pt straddling the bench with an emphasis on weightbearing into LUE, MIN verbal cues needed to activate L tricep to keep LUE extended - pt completed peg board task from quadraped with RUE with an emphasis on weightbearing into LUE during dynamic reaching.  - pt completed additional peg board task with pt Standing  in front of mat while leaning forward onto mat table with an emphasis on weightbearing on LUE, pt completed task with supervision and MIN verbal cues to keep LUE activated.  - pt completed reciprocal Stepping task with pt stepping  onto 2 inch step then reaching dynamically with LUE to remove pegs with LUE, additionally weighted LUE with 4lb wrist weight to provide increased proprioceptive input to decrease ataxia.  - pt completed ambulatory functional mobility task with pt instructed to retrieve horseshoes positioned on both side of hallway to  challenge dynamic reaching and improve LUE coordination.pt completed task with supervision   Ended session with pt seated in recliner with all needs within reach and chair alarm activated.                    Session 2:   Pt greeted up in room with NT, pt agreeable to OT intervention. No c/o pain during session.   Transfers/bed mobility: pt completed all sit>stands and functional ambulation with RW and supervision.   Therapeutic activity: pt completed ambulatory therapeutic activity with pt instructed to ambulate in between 2 tables ~ 61ft apart to transport dots from one table to set of cones placed on floor. Pt instructed to use LUE to retrieve dots and then match dots to cones on floor to practice reaching to ground for IADLSs. Ataxia noted in LUE but pt able to complete task with CGA.  Pt completed Standing bimnual task using leggos with pt instrucetd to duplicate structure from visual aid provided in standing. Yoga block placed underneath RLE to promote increased weightbaring in LLE. Pt completed task with + time but supervision. Noted pt began creating structure from R>L vs L>R.  Pt completed Seated [facility] task with pt instructed to duplicate geometric shape structure from visual aid provided  with LUE, donned 4lb  weight to decrease ataxia in LUE. Pt completed task with + time but overall supervision.   Education: education provided on providing proximal support during [facility] tasks ( propping elbow on table, keeping elbow adducted to oblique) to decrease ataxia in LUE, additionally  discussed purchasing wrist weight for LUE to also decrease ataxia.   Ended session with pt seated in recliner with all needs within reach.                Session 3:  Pt greeted seated in recliner, pt agreeable to OT intervention.    No c/o pain during session.   Transfers/bed mobility: pt completed all functional mobility and sit>stands with RW and supervision.   Therapeutic activity: pt completed various  therapeutic activities to promote improved LUE coordination, tasks included:  -Reaching for squigz with LUE in standing to promote improved coordination and motor planning, pt needed MIN verbal cues to carryover compensatory methods to provide proximal support to decrease ataxia in LUE -Reaching to remove horseshoes from cylindrical position with LUE, pt completed task with supervision with MIN verbal cues to carryover compensatory methods to provide proximal support to decrease ataxia in LUE   - pt able to stand at hi lo table to fold Wash cloths for bimanual integration in order to promote improved FMC in LUE, pt completed task with MIN verbal cues for set- up and technique - pt completed additional Bimanual task  with pt hanging up wash cloths with clothespins to provide bilateral integration to LUE.    ADLs:  Grooming: pt stood at sink for hand hygiene with supervision with no UE support or LOB.   Transfers: pt completed ambulatory toilet transfer with Rw and supervision.  Toileting: supervision for 3/3 toileting tasks, continent urine void, indicated in flowsheets.    NMR:  Pt stood at hi lo table with LUE positioned on table in Weightbearing position with pt instructed to use RUE to reach to checkered board to remove squigz to promote increased weightbearing in LUE. Pt completed task with supervision with min verbal cues to keep L elbow activated.    Ended session with pt seated in recliner with all needs within reach.    Therapy Documentation Precautions:  Precautions Precautions: Fall Precaution Comments: L side hemipareisis with reduced coordination Restrictions Weight Bearing Restrictions: No General:   Vital Signs:   Pain: Pain Assessment Pain Scale: 0-10 Pain Score: 0-No pain ADL: ADL ADL Comments: supervision overall except min A to fasten bra and CGA with ADL transfers Vision   Perception    Praxis   Balance   Exercises:   Other Treatments:      Therapy/Group: Individual Therapy  Pollyann Glen [facility] 08/17/2023, 12:05 PM

## 2023-08-18 DIAGNOSIS — R7303 Prediabetes: Secondary | ICD-10-CM | POA: Diagnosis not present

## 2023-08-18 DIAGNOSIS — I10 Essential (primary) hypertension: Secondary | ICD-10-CM | POA: Diagnosis not present

## 2023-08-18 DIAGNOSIS — I639 Cerebral infarction, unspecified: Secondary | ICD-10-CM | POA: Diagnosis not present

## 2023-08-18 NOTE — Progress Notes (Signed)
Occupational Therapy Session Note  Patient Details  Name: Priscilla Wilson MRN: [id number] Date of Birth: 1951/04/24  Today's Date: 08/18/2023 OT Individual Time: 1402-1501 OT Individual Time Calculation (min): 59 min    Short Term Goals: Week 1:  OT Short Term Goal 1 (Week 1): Pt will demonstrate improved L hand coordination to fasten and unfasten her bra. OT Short Term Goal 2 (Week 1): Pt will be able to use L hand to hold fork when cutting meat with R hand. OT Short Term Goal 3 (Week 1): pt will be able to use L hand to place food items in kitchen pantry.  Skilled Therapeutic Interventions/Progress Updates:  Pt greeted seated EOB, pt agreeable to OT intervention.      Transfers/bed mobility: pt completed functional ambulation with Rw and supervision, donned 5lb weight to LLE with pt able to complete functional ambulation greater than a household distance to provide increased NMR to LLE. Pt also able to ambulate from her room<>gym with rw and supervision assist.   ADLs:  Grooming: pt stood at sink for hand hygiene with supervision.   Transfers: pt completed ambulatory toilet transfer with Rw and supervision.  Toileting: pt completed 3/3 toileting tasks with supervision, continent urine/bowel void.    NMR:  Pt completed various NMR tasks to LUE as indicated below to decrease ataxia and promote improved motor planning:  -pt stood at complete PVC pipe tree with LUE, 4lb weighted applied to LUE for increased proprioceptive input, pt completed task with supervision- -pt stood in front of mat table with BUEs positioned on mat with 4lb weighted applied to LUE and pt instructed to use RUE to transport squigz across midline to emphasize increased weightbearing in LUE, pt completed task with MIN verbal/tactile cues to keep LUE extended -pt able to stand at hi lo table to use tweezers to pick up small plastic bugs with 4lb wrist weight donned to challenge LUE [facility] and provide increased  proprioceptive input to LUE, pt completed task with supervision with + time.  -pt transitioned into quadraped on mat table with pt completing modified plank walks from quadraped>tall kneeling on bench for 1 min with overall supervision to promote improved proprioception to LUE/LLE. -pt completed lateral reaching/across midline reaching with RUE while having Bilateral forearms propped on bench to promote weightbearing in LUE, pt completed task with supervision.     Ended session with pt  in recliner with all needs within reach and chair alarm activated.                    Therapy Documentation Precautions:  Precautions Precautions: Fall Precaution Comments: L side hemipareisis with reduced coordination Restrictions Weight Bearing Restrictions: No  Pain:no pain    Therapy/Group: Individual Therapy  Pollyann Glen [facility] 08/18/2023, 3:13 PM

## 2023-08-18 NOTE — Progress Notes (Signed)
Occupational Therapy Session Note  Patient Details  Name: Priscilla Wilson MRN: [id number] Date of Birth: 1951/04/27  Today's Date: 08/18/2023 OT Individual Time: 4098-1191 OT Individual Time Calculation (min): 60 min    Short Term Goals: Week 1:  OT Short Term Goal 1 (Week 1): Pt will demonstrate improved L hand coordination to fasten and unfasten her bra. OT Short Term Goal 2 (Week 1): Pt will be able to use L hand to hold fork when cutting meat with R hand. OT Short Term Goal 3 (Week 1): pt will be able to use L hand to place food items in kitchen pantry.  Skilled Therapeutic Interventions/Progress Updates:    Pt received in bed ready for therapy.  Focus of therapy session on L side coordination and balance with ADLs. Pt eager to shower. She used her RW to ambulate to suitcase to retrieve clothing. She is able to stand without UE support to pick up items out of suitcase.    She practiced doffing underwear and socks in standing.  Place L hand on steady support with cues and then used R hand. She can easily lift left leg and hold single leg R stance with L hand support but when lifting R foot her L leg is not stable and noted some knee hyperextension. Pt tried 2x and 2nd time she felt a pull in her outer hip possibly her IT band.    Pt then ambulated into shower and completed shower in seated with mod I and when standing with supervision.  Ambulated to EOB to dress.  Pt did well donning clothing. It took her several tries to fasten button on tight jeans but pt was eventually able to do so.  For LUE NMR with sensory input used a thick looped exercise band and anchored it on R shoulder and then wrapped around her forearm to her palm.   The pressure from the band gave increased proprioceptive input to arm as she worked on both open chain exercises (arm circles, reaching) and closed chain (on table top and then against wall of moving hand in various directions and implementing small push ups). Pt  reports overall her proprioception of L arm has improved.    Pt also worked with small lunges of LLE with small knee bends as she did the modified wall push ups.    Pt resting in recliner with all needs met and alarm set.       Therapy Documentation Precautions:  Precautions Precautions: Fall Precaution Comments: L side hemipareisis with reduced coordination Restrictions Weight Bearing Restrictions: No    Vital Signs: Therapy Vitals Temp: 97.9 F (36.6 C) Pulse Rate: 62 Resp: 18 BP: (!) 144/77 Patient Position (if appropriate): Lying Oxygen Therapy SpO2: 97 % O2 Device: Room Air Pain: Pain Assessment Pain Scale: 0-10 Pain Score: 2  Pain Type: Acute pain Pain Location: Shoulder Pain Orientation: Left Pain Descriptors / Indicators: Jabbing Pain Frequency: Intermittent Pain Onset: With Activity Pain Intervention(s): Medication (See eMAR) ADL: ADL ADL Comments: supervision overall except min A to fasten bra and CGA with ADL transfers   Therapy/Group: Individual Therapy  Johanthan Kneeland 08/18/2023, 8:26 AM

## 2023-08-18 NOTE — Progress Notes (Signed)
Physical Therapy Session Note  Patient Details  Name: Priscilla Wilson MRN: [id number] Date of Birth: [birth year]  Today's Date: 08/18/2023 PT Individual Time: 1008-1107 PT Individual Time Calculation (min): 59 min   Short Term Goals: Week 1:  PT Short Term Goal 1 (Week 1): Pt will perform functional standing transfers with light CGA using LRAD. PT Short Term Goal 2 (Week 1): Pt will ambulate community distances using LRAD with consistent supervision and min vc for technique. PT Short Term Goal 3 (Week 1): Pt will complete appropriate outcome measures.  Skilled Therapeutic Interventions/Progress Updates: Patient sitting in recliner on entrance to room. Patient alert and agreeable to PT session.   Patient reported no pain at beginning of PT session  Therapeutic Activity: Transfers: Pt performed sit<>stand transfers throughout session with RW and with modI.   Gait Training:  Pt ambulated room<>main gym using RW with CGA/supervision for safety. Pt demonstrated the following gait deviations with therapist providing the described cuing and facilitation for improvement:  - Initial increased step length on L LE once starting in room with VC to decrease step length - Slight circumduction into adduction during swing phase with VC to bring L LE straight forward via hip flexor activation - Pt ambulated in and around main gym with L HHA. Pt with minA and with min WB pressing into PTA hand. Pt with decreased hip circumduction in L LE (this followed NMRE).   Neuromuscular Re-ed: NMR facilitated during session with focus on increasing L hip flexor activation for gait cycle carryover during swing phase. - Seated hip flexion with 5lb ankle weight on L LE with pt cued to lean posteriorly with B UE support to increase hip flexor activation. Pt progressed to 8lb ankle weight until close to fatigue, and then progressed to having same ankle weight with PTA pulling pt into hip flexion with cues for pt to control  flexion due to pt presenting with cogwheel like motion with initially performing hip flexion task prior to adding ankle weights.  - Pt performing hip flexion, down to flat on mat, then leg extension until fatigue with 8lb ankle weight.  - Pt stated having difficulty controlling eccentric of L elbow flexion (cogwheel-like). PTA provided exercise when back in room with yellow theraband around L LE and in pt's L hand with cues to contract L elbow flexors 1 second, then slow and controlled eccentric 4 seconds, and to perform 2-3 sets throughout the day when not in therapy. Pt performed 2 sets to show pt could understand sequence and set-up.  NMR performed for improvements in motor control and coordination, balance, sequencing, judgement, and self confidence/ efficacy in performing all aspects of mobility at highest level of independence.   Manual Therapy: - Pt stated feeling some pain (unrated) and discomfort in L UE since feeling has started coming back. PTA palpated L upper trap musculature with noted knots present and pt reporting feeling pain down L UE when pressed. Trigger point release education provided with pt wanting to try. PTA performed at beginning of session and end of session to avoid increased soreness. Pt reported feeling it helped with discomfort/pain and increased ROM on L UE. Pt also reported some impingement when raising L UE into flexion (thumbs were pointing down). Pt educated on anatomy of shoulder joint and to instead raise UE with thumbs facing the ceiling (pt reported it did not impinge).  Patient sitting in recliner at end of session with brakes locked, chair alarm set, and all needs within reach.  Therapy Documentation Precautions:  Precautions Precautions: Fall Precaution Comments: L side hemipareisis with reduced coordination Restrictions Weight Bearing Restrictions: No  Therapy/Group: Individual Therapy  Lanora Reveron PTA 08/18/2023, 12:15 PM

## 2023-08-18 NOTE — Progress Notes (Signed)
PROGRESS NOTE   Subjective/Complaints:  Discussed BP management, pt still on target for discharge   ROS- denies CP, SOB, N/V/D  Objective:   No results found. Recent Labs    08/17/23 0727  WBC 7.0  HGB 14.6  HCT 45.3  PLT 240    Recent Labs    08/17/23 0727  NA 138  K 4.3  CL 106  CO2 21*  GLUCOSE 126*  BUN 23  CREATININE 1.03*  CALCIUM 9.6     Intake/Output Summary (Last 24 hours) at 08/18/2023 0738 Last data filed at 08/17/2023 1306 Gross per 24 hour  Intake 592 ml  Output --  Net 592 ml        Physical Exam: Vital Signs Blood pressure (!) 144/77, pulse 62, temperature 97.9 F (36.6 C), resp. rate 18, height 5' (1.524 m), weight 61.4 kg, SpO2 97%.   General: No acute distress, sitting in chair talking on the phone Mood and affect are appropriate Heart: Bradycardia Lungs: Clear to auscultation, breathing unlabored, no rales or wheezes Abdomen: Positive bowel sounds, soft nontender to palpation, nondistended Extremities: No clubbing, cyanosis, or edema Skin: No evidence of breakdown, no evidence of rash over exposed surfaces MsK: moving extremities antegravity  PRIOR EXAMS: Neurologic: Cranial nerves II through XII intact, motor strength is 5/5 in bilateral deltoid, bicep, tricep, grip, hip flexor, knee extensors, ankle dorsiflexor and plantar flexor Sensory exam normal sensation to light touch and proprioception in left  upper  Cerebellar exam mod dysmetria Left finger to nose to finger  Musculoskeletal: Full range of motion in all 4 extremities. No joint swelling   Assessment/Plan: 1. Functional deficits which require 3+ hours per day of interdisciplinary therapy in a comprehensive inpatient rehab setting. Physiatrist is providing close team supervision and 24 hour management of active medical problems listed below. Physiatrist and rehab team continue to assess barriers to discharge/monitor  patient progress toward functional and medical goals  Care Tool:  Bathing    Body parts bathed by patient: Right arm, Left arm, Chest, Abdomen, Front perineal area, Buttocks, Right upper leg, Face, Left lower leg, Right lower leg, Left upper leg         Bathing assist Assist Level: Supervision/Verbal cueing     Upper Body Dressing/Undressing Upper body dressing   What is the patient wearing?: Bra, Pull over shirt    Upper body assist Assist Level: Supervision/Verbal cueing    Lower Body Dressing/Undressing Lower body dressing      What is the patient wearing?: Underwear/pull up, Pants     Lower body assist Assist for lower body dressing: Supervision/Verbal cueing     Toileting Toileting    Toileting assist Assist for toileting: Supervision/Verbal cueing     Transfers Chair/bed transfer  Transfers assist     Chair/bed transfer assist level: Supervision/Verbal cueing     Locomotion Ambulation   Ambulation assist      Assist level: Contact Guard/Touching assist   Max distance: 150   Walk 10 feet activity   Assist     Assist level: Contact Guard/Touching assist Assistive device: Walker-rolling   Walk 50 feet activity   Assist    Assist level: Contact  Guard/Touching assist Assistive device: Walker-rolling    Walk 150 feet activity   Assist    Assist level: Contact Guard/Touching assist Assistive device: Walker-rolling    Walk 10 feet on uneven surface  activity   Assist Walk 10 feet on uneven surfaces activity did not occur: Safety/medical concerns         Wheelchair     Assist Is the patient using a wheelchair?: Yes (per PT report to this Clinical research associate. (error in documentation *no changed to YES).) Type of Wheelchair: Manual    Wheelchair assist level: Dependent - Patient 0% Max wheelchair distance: 200 ft    Wheelchair 50 feet with 2 turns activity    Assist        Assist Level: Dependent - Patient 0%    Wheelchair 150 feet activity     Assist      Assist Level: Dependent - Patient 0%   Blood pressure (!) 144/77, pulse 62, temperature 97.9 F (36.6 C), resp. rate 18, height 5' (1.524 m), weight 61.4 kg, SpO2 97%.  Medical Problem List and Plan: 1. 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  Antithrombotics: -DVT/anticoagulation:  Pharmaceutical: off lovenox amb well             -antiplatelet therapy: Aspirin and Plavix for three weeks followed by aspirin alone (stop Plavix 10/17)   3. Pain Management: Tylenol as needed   4. Mood/Behavior/Sleep: LCSW to evaluate and provide emotional support             -pt appears positive and up beat. Wants to regain independence!             -continue Effexor-XR 75 mg q AM             -antipsychotic agents: n/a   5. Neuropsych/cognition: This patient is capable of making decisions on her own behalf.   6. Skin/Wound Care: Routine skin care checks   7. Fluids/Electrolytes/Nutrition: Routine Is and Os and follow-up chemistries   8: Hypertension: monitor TID and prn             -continue Lopressor 25 mg BID  Elevated  10/4- may need to increase lopressor if HR is >70  -08/15/23 BPs a bit better, monitor for now.   -08/16/23 BP variable but mostly 130s SBP; HR variable 60-70s; will hold off on changing lopressor for now, BPs are mostly stable.  MIld sys elevation of ~50% readings cont current meds  Vitals:   08/17/23 1928 08/18/23 0515  BP: 128/78 (!) 144/77  Pulse: 64 62  Resp: 15 18  Temp: 98.5 F (36.9 C) 97.9 F (36.6 C)  SpO2: 97% 97%   Vitals:   08/14/23 1324 08/14/23 2014 08/15/23 0516 08/15/23 1244  BP: (!) 146/70 136/69 135/72 138/68   08/15/23 1832 08/16/23 0557 08/16/23 1300 08/16/23 1924  BP: (!) 146/75 (!) 142/75 (!) 140/77  139/76   08/17/23 0303 08/17/23 1427 08/17/23 1928 08/18/23 0515  BP: (!) 149/68 126/61 128/78 (!) 144/77     9: Hyperlipidemia: continue statin- LDL is 88 at goal    10: GERD: continue home famotidine   11: Hyperthyroidism with palpitations: continue methimazole and BB   12: History of  asthma: continue albuterol MDI as needed   13: Prediabetes: carb modified diet- continue reg diet               -sugars have been well controlled. Probably can dc cbg's within the next 24 hours if they remain stable May d/c 10/2          LOS: 8 days A FACE TO FACE EVALUATION WAS PERFORMED  Erick Colace 08/18/2023, 7:38 AM

## 2023-08-18 NOTE — Progress Notes (Signed)
Physical Therapy Session Note  Patient Details  Name: Priscilla Wilson MRN: [id number] Date of Birth: 1950-12-25  Today's Date: 08/18/2023 PT Individual Time: 4098-1191 PT Individual Time Calculation (min): 33 min   Short Term Goals: Week 1:  PT Short Term Goal 1 (Week 1): Pt will perform functional standing transfers with light CGA using LRAD. PT Short Term Goal 2 (Week 1): Pt will ambulate community distances using LRAD with consistent supervision and min vc for technique. PT Short Term Goal 3 (Week 1): Pt will complete appropriate outcome measures.  Skilled Therapeutic Interventions/Progress Updates:  Patient seated upright in recliner on entrance to room. Patient alert and agreeable to PT session.   Patient with no pain complaint at start of session.  Therapeutic Activity: Bed Mobility: Pt performed supine <> sit with Mod I requiring extra time.  Transfers: Pt performed sit<>stand and stand pivot transfers throughout session with close supervision. No cueing required for technique. Min vc for safety.   Neuromuscular Re-ed: NMR facilitated during session with focus UE and LE coordination in strengthening. Pt guided in isometric hold of Lwrist into neutral flex/ ext with supinated grip to yellow tband and other end of loop under pt's L foot. Guided in iso hold of UE with AAROM/ resisted L LE march. Pt cued to resist ER and draw knee up straight. Required tc throughout. Also guided in stretch to L hip ERs and glutes while supine in bed.   NMR performed for improvements in motor control and coordination, balance, sequencing, judgement, and self confidence/ efficacy in performing all aspects of mobility at highest level of independence.   Gait Training:  Pt ambulated 30' x2 with no AD and CGA in order to focus on LLE timing, coordination, heel strike, knee flexion in swing phase, and step length. Demonstrated improvements since [facility] with continuous adjustments being made by pt for corrections  of errant movements. Improvement overall in reduced step length and heel strike when provided with vc. Improvement also noted in increased periods of swing phase to stance phase transition as well as with smoother movement during swing phase. Only a few instances of NBOS for which pt requires CGA.   Shorter distance ambulation performed with mirror for immediate visual feedback during ambulation in order to better link KP to proprioception and KR. Improved smoothness of movement during swing phase with increased steadiness in ambulation over 20 ft. Backward ambulation performed and provided with vc to correct NBOS and length of step. Fwd/ bkwd stepping performed x2 with similar outcomes.    Patient seated on EOB at end of session with brakes locked, bed alarm set, and all needs within reach.   Therapy Documentation Precautions:  Precautions Precautions: Fall Precaution Comments: L side hemipareisis with reduced coordination Restrictions Weight Bearing Restrictions: No  Pain:  Low grade pain noted during session in L proximal anterolateral thigh that decreased with mobility.   Therapy/Group: Individual Therapy  Loel Dubonnet PT, DPT, CSRS 08/18/2023, 6:14 PM

## 2023-08-19 NOTE — Progress Notes (Signed)
Occupational Therapy Weekly Progress Note  Patient Details  Name: Priscilla Wilson MRN: [id number] Date of Birth: [birth year]  Beginning of progress report period: August 11, 2023 End of progress report period: August 19, 2023   Patient has met 3 of 3 short term goals.  Pt is making excellent progress and is able to compensate for limited L side sensory deficits to use L arm in functional tasks.    Patient continues to demonstrate the following deficits: unbalanced muscle activation, decreased coordination, and decreased sensation in L side with limited balance in 1 leg stance in standing and therefore will continue to benefit from skilled OT intervention to enhance overall performance with BADL and iADL.  Patient progressing toward long term goals..  Continue plan of care.  OT Short Term Goals Week 1:  OT Short Term Goal 1 (Week 1): Pt will demonstrate improved L hand coordination to fasten and unfasten her bra. OT Short Term Goal 1 - Progress (Week 1): Met OT Short Term Goal 2 (Week 1): Pt will be able to use L hand to hold fork when cutting meat with R hand. OT Short Term Goal 2 - Progress (Week 1): Met OT Short Term Goal 3 (Week 1): pt will be able to use L hand to place food items in kitchen pantry. OT Short Term Goal 3 - Progress (Week 1): Met Week 2:  OT Short Term Goal 1 (Week 2): STGs= LTGs      Therapy Documentation Precautions:  Precautions Precautions: Fall Precaution Comments: L side hemipareisis with reduced coordination Restrictions Weight Bearing Restrictions: No    ADL: ADL ADL Comments: supervision overall   [name] 08/19/2023, 10:03 AM

## 2023-08-19 NOTE — Progress Notes (Addendum)
PROGRESS NOTE   Subjective/Complaints:  No issues overnite , working on LUE coordination fingertips still feel numb  ROS- denies CP, SOB, N/V/D  Objective:   No results found. Recent Labs    08/17/23 0727  WBC 7.0  HGB 14.6  HCT 45.3  PLT 240    Recent Labs    08/17/23 0727  NA 138  K 4.3  CL 106  CO2 21*  GLUCOSE 126*  BUN 23  CREATININE 1.03*  CALCIUM 9.6     Intake/Output Summary (Last 24 hours) at 08/19/2023 0731 Last data filed at 08/19/2023 0250 Gross per 24 hour  Intake 646 ml  Output 60 ml  Net 586 ml        Physical Exam: Vital Signs Blood pressure 132/67, pulse 64, temperature 98.2 F (36.8 C), resp. rate 18, height 5' (1.524 m), weight 61.4 kg, SpO2 99%.   General: No acute distress, sitting in chair talking on the phone Mood and affect are appropriate Heart: Bradycardia Lungs: Clear to auscultation, breathing unlabored, no rales or wheezes Abdomen: Positive bowel sounds, soft nontender to palpation, nondistended Extremities: No clubbing, cyanosis, or edema Skin: No evidence of breakdown, no evidence of rash over exposed surfaces MsK: moving extremities antegravity  PRIOR EXAMS: Neurologic: Cranial nerves II through XII intact, motor strength is 5/5 in Right 4/5 left deltoid, bicep, tricep, grip, hip flexor, knee extensors, ankle dorsiflexor and plantar flexor Sensory exam normal sensation to light touch and proprioception in left  upper  Cerebellar exam mod dysmetria Left finger to nose to finger  Musculoskeletal: Full range of motion in all 4 extremities. No joint swelling   Assessment/Plan: 1. Functional deficits which require 3+ hours per day of interdisciplinary therapy in a comprehensive inpatient rehab setting. Physiatrist is providing close team supervision and 24 hour management of active medical problems listed below. Physiatrist and rehab team continue to assess barriers  to discharge/monitor patient progress toward functional and medical goals  Care Tool:  Bathing    Body parts bathed by patient: Right arm, Left arm, Chest, Abdomen, Front perineal area, Buttocks, Right upper leg, Face, Left lower leg, Right lower leg, Left upper leg         Bathing assist Assist Level: Supervision/Verbal cueing     Upper Body Dressing/Undressing Upper body dressing   What is the patient wearing?: Bra, Pull over shirt    Upper body assist Assist Level: Supervision/Verbal cueing    Lower Body Dressing/Undressing Lower body dressing      What is the patient wearing?: Underwear/pull up, Pants     Lower body assist Assist for lower body dressing: Supervision/Verbal cueing     Toileting Toileting    Toileting assist Assist for toileting: Supervision/Verbal cueing     Transfers Chair/bed transfer  Transfers assist     Chair/bed transfer assist level: Supervision/Verbal cueing     Locomotion Ambulation   Ambulation assist      Assist level: Contact Guard/Touching assist   Max distance: 150   Walk 10 feet activity   Assist     Assist level: Contact Guard/Touching assist Assistive device: Walker-rolling   Walk 50 feet activity   Assist  Assist level: Contact Guard/Touching assist Assistive device: Walker-rolling    Walk 150 feet activity   Assist    Assist level: Contact Guard/Touching assist Assistive device: Walker-rolling    Walk 10 feet on uneven surface  activity   Assist Walk 10 feet on uneven surfaces activity did not occur: Safety/medical concerns         Wheelchair     Assist Is the patient using a wheelchair?: Yes (per PT report to this Clinical research associate. (error in documentation *no changed to YES).) Type of Wheelchair: Manual    Wheelchair assist level: Dependent - Patient 0% Max wheelchair distance: 200 ft    Wheelchair 50 feet with 2 turns activity    Assist        Assist Level: Dependent -  Patient 0%   Wheelchair 150 feet activity     Assist      Assist Level: Dependent - Patient 0%   Blood pressure 132/67, pulse 64, temperature 98.2 F (36.8 C), resp. rate 18, height 5' (1.524 m), weight 61.4 kg, SpO2 99%.  Medical Problem List and Plan: 1. 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 Formulized individual treatment plan based on medical history, underlying problem and comorbidities.  2.  Antithrombotics: -DVT/anticoagulation:  Pharmaceutical: off lovenox amb well             -antiplatelet therapy: Aspirin and Plavix for three weeks followed by aspirin alone (stop Plavix 10/17)   3. Pain Management: Tylenol as needed   4. Mood/Behavior/Sleep: LCSW to evaluate and provide emotional support             -pt appears positive and up beat. Wants to regain independence!             -continue Effexor-XR 75 mg q AM             -antipsychotic agents: n/a   5. Neuropsych/cognition: This patient is capable of making decisions on her own behalf.   6. Skin/Wound Care: Routine skin care checks   7. Fluids/Electrolytes/Nutrition: Routine Is and Os and follow-up chemistries   8: Hypertension: monitor TID and prn             -continue Lopressor 25 mg BID  Elevated  10/4- may need to increase lopressor if HR is >70  -08/15/23 BPs a bit better, monitor for now.   -08/16/23 BP variable but mostly 130s SBP; HR variable 60-70s; will hold off on changing lopressor for now, BPs are mostly stable.  Improved today 10/9 Vitals:   08/19/23 0258 08/19/23 0516  BP: 139/80 132/67  Pulse: 84 64  Resp: 17 18  Temp: 97.9 F (36.6 C) 98.2 F (36.8 C)  SpO2: 99%  99%   Vitals:   08/15/23 1832 08/16/23 0557 08/16/23 1300 08/16/23 1924  BP: (!) 146/75 (!) 142/75 (!) 140/77 139/76   08/17/23 0303 08/17/23 1427 08/17/23 1928 08/18/23 0515  BP: (!) 149/68 126/61 128/78 (!) 144/77   08/18/23 1503 08/18/23 2013 08/19/23 0258 08/19/23 0516  BP: 122/69 (!) 153/88 139/80 132/67     9: Hyperlipidemia: continue statin- LDL  is 88 at goal    10: GERD: continue home famotidine   11: Hyperthyroidism with palpitations: continue methimazole and BB   12: History of asthma: continue albuterol MDI as needed   13: Prediabetes: carb modified diet- continue reg diet                 LOS: 9 days A FACE TO FACE EVALUATION WAS PERFORMED  Erick Colace 08/19/2023, 7:31 AM

## 2023-08-19 NOTE — Progress Notes (Signed)
Physical Therapy Weekly Progress Note  Patient Details  Name: Priscilla Wilson MRN: [id number] Date of Birth: 1951/06/24  Beginning of progress report period: August 11, 2023 End of progress report period: August 19, 2023  Patient has met 3 of 3 short term goals. Pt is on track to meet LTG's due to meeting all of prescribed STG's this past week. Pt's functional progress can be seen by pt's ability to transfer with supervision using a RW. Pt also has demonstrated ability to ambulate outside in an uncontrolled environment more than 350' using RW with supervision and min VC for technique during gait cycle (Pt continues to show slight circumduction to adduction in L LE, but has shown improvement since evaluation with isolating hip flexors activation - pt also has decreased cues required to decrease step length on L LE). Lastly, pt has increased distance from 79' to 1041' and demonstrates improved control of L LE during test (Pt has also performed 5 x sit to stand and BERG outcome measures which will be updated on grad day).   Patient continues to demonstrate the following deficits {impairments:3041632} and therefore will continue to benefit from skilled PT intervention to increase functional independence with mobility.  Patient {LTG progression:3041653}.  {plan of NWGN:5621308}  PT Short Term Goals Week 1:  PT Short Term Goal 1 (Week 1): Pt will perform functional standing transfers with light CGA using LRAD. PT Short Term Goal 2 (Week 1): Pt will ambulate community distances using LRAD with consistent supervision and min vc for technique. PT Short Term Goal 3 (Week 1): Pt will complete appropriate outcome measures.  Skilled Therapeutic Interventions/Progress Updates:      Therapy Documentation Precautions:  Precautions Precautions: Fall Precaution Comments: L side hemipareisis with reduced coordination Restrictions Weight Bearing Restrictions: No   Letetia Romanello PTA  08/19/2023,  3:31 PM

## 2023-08-19 NOTE — Discharge Instructions (Addendum)
Inpatient Rehab Discharge Instructions  Priscilla Wilson Discharge date and time: 08/21/2023   Activities/Precautions/ Functional Status: Activity: no lifting, driving, or strenuous exercise until cleared by MD Diet: regular diet Wound Care: none needed Functional status:  ___ No restrictions     ___ Walk up steps independently ___ 24/7 supervision/assistance   ___ Walk up steps with assistance __x_ Intermittent supervision/assistance  ___ Bathe/dress independently ___ Walk with walker     ___ Bathe/dress with assistance ___ Walk Independently    ___ Shower independently ___ Walk with assistance    _x__ Shower with assistance _x__ No alcohol     ___ Return to work/school ________  Special Instructions: No driving, alcohol consumption or tobacco use.  Recommend daily BP measurement in same arm and record time of day. Bring this information with you to follow-up appointment with PCP.  COMMUNITY REFERRALS UPON DISCHARGE:    Outpatient: PT     OT                Agency: [facility] Phone: [phone]              Appointment Date/Time: TBD  Medical Equipment/Items Ordered: Youth Rolling Walker                                                 Agency/Supplier: [employer] [phone]    STROKE/TIA DISCHARGE INSTRUCTIONS SMOKING Cigarette smoking nearly doubles your risk of having a stroke & is the single most alterable risk factor  If you smoke or have smoked in the last 12 months, you are advised to quit smoking for your health. Most of the excess cardiovascular risk related to smoking disappears within a year of stopping. Ask you doctor about anti-smoking medications [place] Quit Line: 1-800-QUIT NOW Free Smoking Cessation Classes [phone]  CHOLESTEROL Know your levels; limit fat & cholesterol in your diet  Lipid Panel     Component Value Date/Time   CHOL 166 08/04/2023 0319   TRIG 78 08/04/2023 0319   HDL 62 08/04/2023 0319   CHOLHDL 2.7 08/04/2023 0319   VLDL  16 08/04/2023 0319   LDLCALC 88 08/04/2023 0319     Many patients benefit from treatment even if their cholesterol is at goal. Goal: Total Cholesterol (CHOL) less than 160 Goal:  Triglycerides (TRIG) less than 150 Goal:  HDL greater than 40 Goal:  LDL (LDLCALC) less than 100   BLOOD PRESSURE American Stroke Association blood pressure target is less that 120/80 mm/Hg  Your discharge blood pressure is:  BP: 127/76 Monitor your blood pressure Limit your salt and alcohol intake Many individuals will require more than one medication for high blood pressure  DIABETES (A1c is a blood sugar average for last 3 months) Goal HGBA1c is under 7% (HBGA1c is blood sugar average for last 3 months)  Diabetes: Diagnosis of diabetes:  Your A1c:6.3 %    Lab Results  Component Value Date   HGBA1C 6.3 (H) 08/04/2023    Your HGBA1c can be lowered with medications, healthy diet, and exercise. Check your blood sugar as directed by your physician Call your physician if you experience unexplained or low blood sugars.  PHYSICAL ACTIVITY/REHABILITATION Goal is 30 minutes at least 4 days per week  Activity: Increase activity slowly, Therapies: Physical Therapy: Outpatient and Occupational Therapy: Outpatient Return to work: when cleared by MD Activity decreases  your risk of heart attack and stroke and makes your heart stronger.  It helps control your weight and blood pressure; helps you relax and can improve your mood. Participate in a regular exercise program. Talk with your doctor about the best form of exercise for you (dancing, walking, swimming, cycling).  DIET/WEIGHT Goal is to maintain a healthy weight  Your discharge diet is:  Diet Order             Diet Carb Modified Fluid consistency: Thin; Room service appropriate? Yes  Diet effective now                  thin liquids Your height is:  Height: 5' (152.4 cm) Your current weight is: Weight: 61.4 kg Your Body Mass Index (BMI) is:  BMI  (Calculated): 26.44 Following the type of diet specifically designed for you will help prevent another stroke. Your goal weight range is:   Your goal Body Mass Index (BMI) is 19-24. Healthy food habits can help reduce 3 risk factors for stroke:  High cholesterol, hypertension, and excess weight.  RESOURCES Stroke/Support Group:  Call [phone]   STROKE EDUCATION PROVIDED/REVIEWED AND GIVEN TO PATIENT Stroke warning signs and symptoms How to activate emergency medical system (call 911). Medications prescribed at discharge. Need for follow-up after discharge. Personal risk factors for stroke. Pneumonia vaccine given: No Flu vaccine given: No My questions have been answered, the writing is legible, and I understand these instructions.  I will adhere to these goals & educational materials that have been provided to me after my discharge from the hospital.     My questions have been answered and I understand these instructions. I will adhere to these goals and the provided educational materials after my discharge from the hospital.  Patient/Caregiver Signature _______________________________ Date __________  Clinician Signature _______________________________________ Date __________  Please bring this form and your medication list with you to all your follow-up doctor's appointments.

## 2023-08-19 NOTE — Progress Notes (Signed)
Patient ID: Priscilla Wilson, female   DOB: 02/17/1951, 72 y.o.   MRN: [id number]  Team Conference Report to Patient/Family  Team Conference discussion was reviewed with the patient and caregiver, including goals, any changes in plan of care and target discharge date.  Patient and caregiver express understanding and are in agreement.  The patient has a target discharge date of 08/21/23.   Sw met with patient and provided team conference updates. Family education arranged with spouse and daughter today. No additional questions or concerns currently. Patient ready for d/c.   Andria Rhein 08/19/2023, 1:56 PM

## 2023-08-19 NOTE — Plan of Care (Signed)
  Problem: Consults Goal: RH STROKE PATIENT EDUCATION Description: See Patient Education module for education specifics  Outcome: Progressing   Problem: RH BOWEL ELIMINATION Goal: RH STG MANAGE BOWEL WITH ASSISTANCE Description: STG Manage Bowel with  mod I Assistance. Outcome: Progressing Goal: RH STG MANAGE BOWEL W/MEDICATION W/ASSISTANCE Description: STG Manage Bowel with Medication with mod I Assistance. Outcome: Progressing   Problem: RH SAFETY Goal: RH STG ADHERE TO SAFETY PRECAUTIONS W/ASSISTANCE/DEVICE Description: STG Adhere to Safety Precautions With  cues Assistance/Device. Outcome: Progressing   Problem: RH KNOWLEDGE DEFICIT Goal: RH STG INCREASE KNOWLEDGE OF DIABETES Description: Patient and spouse will be able to manage prediabetes with dietary modification using educational resources independently Outcome: Progressing Goal: RH STG INCREASE KNOWLEDGE OF HYPERTENSION Description: Patient and spouse will be able to manage HTN with medications and dietary modification using educational resources independently Outcome: Progressing Goal: RH STG INCREASE KNOWLEGDE OF HYPERLIPIDEMIA Description: Patient and spouse will be able to manage HLD with medications and dietary modification using educational resources independently Outcome: Progressing Goal: RH STG INCREASE KNOWLEDGE OF STROKE PROPHYLAXIS Description: Patient and spouse will be able to manage secondary risks with medications and dietary modification using educational resources independently Outcome: Progressing   Problem: Education: Goal: Knowledge of disease or condition will improve Outcome: Progressing Goal: Knowledge of secondary prevention will improve (MUST DOCUMENT ALL) Outcome: Progressing Goal: Knowledge of patient specific risk factors will improve Loraine Leriche N/A or DELETE if not current risk factor) Outcome: Progressing   Problem: Ischemic Stroke/TIA Tissue Perfusion: Goal: Complications of ischemic  stroke/TIA will be minimized Outcome: Progressing   Problem: Coping: Goal: Will verbalize positive feelings about self Outcome: Progressing Goal: Will identify appropriate support needs Outcome: Progressing   Problem: Health Behavior/Discharge Planning: Goal: Ability to manage health-related needs will improve Outcome: Progressing Goal: Goals will be collaboratively established with patient/family Outcome: Progressing   Problem: Self-Care: Goal: Ability to participate in self-care as condition permits will improve Outcome: Progressing Goal: Verbalization of feelings and concerns over difficulty with self-care will improve Outcome: Progressing Goal: Ability to communicate needs accurately will improve Outcome: Progressing   Problem: Nutrition: Goal: Risk of aspiration will decrease Outcome: Progressing Goal: Dietary intake will improve Outcome: Progressing

## 2023-08-19 NOTE — Progress Notes (Signed)
Occupational Therapy Session Note  Patient Details  Name: Priscilla Wilson MRN: [id number] Date of Birth: Jan 05, 1951  Today's Date: 08/19/2023 OT Individual Time: 4098-1191 & 4782-9562 OT Individual Time Calculation (min): 40 min & 43 min OT missed time: 17 min Missed time reason: fatigue    Short Term Goals: Week 1:  OT Short Term Goal 1 (Week 1): Pt will demonstrate improved L hand coordination to fasten and unfasten her bra. OT Short Term Goal 2 (Week 1): Pt will be able to use L hand to hold fork when cutting meat with R hand. OT Short Term Goal 3 (Week 1): pt will be able to use L hand to place food items in kitchen pantry.  Skilled Therapeutic Interventions/Progress Updates:  Session 1 Skilled OT intervention completed with focus on ambulatory endurance and trial on textured surfaces, IADL management education and DME education. Pt received seated EOB, agreeable to session. No pain reported.  Pt declined self-care needs at start of session. Completed all sit > stands and ambulatory transfers with RW with close supervision/CGA for > 100 ft distances.  Ambulated > ADL apartment. Pt trialed ambulating on carpeted surfaces, as pt has some in her home. Discussed adaptations that can be made to RW for increasing the glide of the legs on RW on carpet.   Ambulated to ADL kitchen. In kitchen, pt demonstrated ability to reach into various cabinet heights, gather light items like plates/bowls, open/close fridge door, all at the ambulatory level without LOB or fatigue while using RW. Cues were needed for positioning of RW, and for hand placement during reaching. Discussed cutting and microwave safety with modified strategies for fatigue. Showed pt RW bag she could get online for carrying items and also walker tray for plated/spill able items.  Ambulated back to room, then pt remained seated EOB, with bed alarm on/activated, and with all needs in reach at end of session.  Session 2 Skilled OT  intervention completed with focus on family education with daughter and husband present. Pt received seated EOB, agreeable to session. No pain reported.  OT provided education on the following in prep for DC: -Recommended Mod I with RW for all mobility (including sit > stands and ambulatory transfers > 150 ft) and Mod I ADLs due to improved balance and Lt sided awareness/control -Reviewed Lt sided deficits including gross motor coordination/strength and proprioception especially with fatigue and dual tasking during functional tasks with education provided about energy conservation techniques and monitoring body's signs/symptoms of fatigue -Confirmed bathroom set up, with family/pt reporting walk in shower with shower chair and walk in tub shower with built in bench, with no further DME needs needed -Energy conservation strategies for showers- lukewarm water, proper ventilation via fan or cracked door to reduce steam, minimizing stands in shower especially during hair washing with eyes closed, and planning ahead for shower tasks to be rested/have rest time following in case of fatigue  Pt completed all sit > stands and ambulatory transfers with supervision using RW for majority of session, however CGA needed towards end of session due to LLE fatigue with poor clearance of Lt foot noted.   Ambulated to ADL bathroom. OT, family and pt problem solved accessibility into walk in shower with swinging door. Had pt ambulate to shower, then "park" RW to left of shower entrance, then use door frame for UE support to side step into shower for purpose of closing shower door without risk of falling. Offered the idea of using suction cup grab bar on  glass shower door for closing the door vs leaning out of shower to close. Pt was able to demo technique with supervision/CGA. Advised pt to practice a dry run at home and do first shower with supervision for extra safety if concerned about the process.   Ambulated back to  room. Pt reported "full body fatigue from showing off" for family, with pt politely requesting to end session early therefore pt missed 17 mins of OT intervention. Will make up missed time as able.  Nursing made aware of family presence for education. Pt remained seated EOB, with bed alarm on/activated, and with all needs in reach at end of session.   Therapy Documentation Precautions:  Precautions Precautions: Fall Precaution Comments: L side hemipareisis with reduced coordination Restrictions Weight Bearing Restrictions: No    Therapy/Group: Individual Therapy  Melvyn Novas, MS, OTR/L  08/19/2023, 3:01 PM

## 2023-08-19 NOTE — Progress Notes (Signed)
Physical Therapy Session Note  Patient Details  Name: Priscilla Wilson MRN: [id number] Date of Birth: 1950/11/24  Today's Date: 08/19/2023 PT Individual Time: 1022-1107 PT Individual Time Calculation (min): 45 min   Short Term Goals: Week 1:  PT Short Term Goal 1 (Week 1): Pt will perform functional standing transfers with light CGA using LRAD. PT Short Term Goal 2 (Week 1): Pt will ambulate community distances using LRAD with consistent supervision and min vc for technique. PT Short Term Goal 3 (Week 1): Pt will complete appropriate outcome measures.  Skilled Therapeutic Interventions/Progress Updates: Patient sitting EOB on entrance to room. Patient alert and agreeable to PT session.   Patient reported no pain this morning.  Therapeutic Activity: Bed Mobility: Pt performed sit to supine form EOB with modI. Transfers: Pt performed sit<>stand transfers throughout session with RW and with modI/supervision. Provided VC for safe sitting management of RW (keep close instead of sitting while rotating RW 90* to the side).   6 Min Walk Test:  Instructed patient to ambulate as quickly and as safely as possible for 6 minutes using LRAD. Patient was allowed to take standing rest breaks without stopping the test, but if the patient required a sitting rest break the clock would be stopped and the test would be over.  Results: 1041 feet (317.297 meters, Avg speed .37m/s) using a RW with closer supervision/CGA. Results indicate that the patient has reduced endurance with ambulation compared to age matched norms.  Age Matched Norms: 34-69 yo M: 17 F: 49, 73-79 yo M: 48 F: 471, 12-89 yo M: 417 F: 392 MDC: 58.21 meters (190.98 feet) or 50 meters (ANPTA Core Set of Outcome Measures for Adults with Neurologic Conditions, 2018)  Gait Training:  Pt ambulated room<>main gym using RW with supervision for safety. Pt demonstrated the following gait deviations with therapist providing the described cuing and  facilitation for improvement:  - slight circumduction + adduction (ataxic) with VC to bring L LE through hip flexion keeping line of swing phase straight  Neuromuscular Re-ed: NMR facilitated during session with focus on neuromuscular control of L LE while ambulating. - Pt ambulating in RW with PTA pulling L LE into hip extension in order to promote tactile stimulation of hip flexion. Pt also cued to maintain lateral aspect of L foot on line of tiles. Pt with CGA for safety and demos ataxic movement but mostly maintained straight line when in stance phase.  NMR performed for improvements in motor control and coordination, balance, sequencing, judgement, and self confidence/ efficacy in performing all aspects of mobility at highest level of independence.   Patient supine in bed at end of session with brakes locked, bed alarm set, and all needs within reach.      Therapy Documentation Precautions:  Precautions Precautions: Fall Precaution Comments: L side hemipareisis with reduced coordination Restrictions Weight Bearing Restrictions: No  Therapy/Group: Individual Therapy  Lyndall Bellot PTA 08/19/2023, 12:33 PM

## 2023-08-20 ENCOUNTER — Other Ambulatory Visit (HOSPITAL_COMMUNITY): Payer: Self-pay

## 2023-08-20 MED ORDER — MELATONIN 5 MG PO TABS: 5.0000 mg | ORAL_TABLET | Freq: Every day | ORAL | Status: DC

## 2023-08-20 MED ORDER — METOPROLOL TARTRATE 25 MG PO TABS
25.0000 mg | ORAL_TABLET | Freq: Two times a day (BID) | ORAL | 0 refills | Status: DC
  Filled 2023-08-20: qty 60, 30d supply, fill #0

## 2023-08-20 MED ORDER — CLOPIDOGREL BISULFATE 75 MG PO TABS
75.0000 mg | ORAL_TABLET | Freq: Every day | ORAL | 0 refills | Status: AC
  Filled 2023-08-20: qty 6, 6d supply, fill #0

## 2023-08-20 MED ORDER — ACETAMINOPHEN 325 MG PO TABS: 325.0000 mg | ORAL_TABLET | ORAL | Status: DC | PRN

## 2023-08-20 MED ORDER — ROSUVASTATIN CALCIUM 20 MG PO TABS
20.0000 mg | ORAL_TABLET | Freq: Every day | ORAL | 0 refills | Status: DC
  Filled 2023-08-20: qty 30, 30d supply, fill #0

## 2023-08-20 NOTE — Progress Notes (Signed)
Occupational Therapy Session Note  Patient Details  Name: Priscilla Wilson MRN: [id number] Date of Birth: [birth year]  Today's Date: 08/20/2023 OT Individual Time: [phone] OT Individual Time Calculation (min): 71 min    Short Term Goals: Week 2:  OT Short Term Goal 1 (Week 2): STGs= LTGs  Skilled Therapeutic Interventions/Progress Updates:  Pt greeted seated in recliner, pt agreeable to OT intervention.      Transfers/bed mobility: pt completed ambulatory transfer to toilet with RW MODI. Pt completed all functional mobility with RW MODI greater than a household distance.  Therapeutic activity:   9 Hole Peg Test is used to measure finger dexterity in pts with various neurological diagnoses. - Instructions The pt was instructed to pick up the pegs one at a time, using their dominant hand first and put them into the holes in any order until the holes were all filled. The pt then removed the pegs one at a time and returned them to the container. Both hands were tested separately.  - Results The pt completed the test in RUE 26 seconds, LUE- 1 min 55 secs. Scores are based on the time taken to complete the activity. The timer started the moment the pt touched the first peg until the moment the last peg hit the container.  - Norms for healthy females ages 47-70+ 70-55 R 17.38 L 18.92 56-60 R 17.86 L 19.48 61-65 R 18.99 L 20.33 66-70 R 19.90 L 21.44 71+ R 22.49 L 24.11  Pt completed standing functional reaching task with pt instructed to place rings over squigz placed on mirror to challenege functional reach and challenge [facility], pt completed task with + time and supervision.   Pt completed standing connect 4 task with 4lb weight donned on L to provide NMR to LUE and to decrease ataixa, education provided on various other [facility] tasks that can be completed at home to work on LUE coordination, handout provided to increase carryover.   Pt completed stereognosis task, pt with difficulty  identifying circular objects  vs spherical as well as small items such as paperclip in LUE, education provided on how task can be adapted at home.    ADLs:  Grooming: MODI to stand at sink for hand hygiene  Toileting: pt completed 3/3 toileting tasks with MODI, continent urine void.   Education: using proximal support as needed to decrease ataxia as well as using wrist weight at home to decrease ataxia, issued pt therex to go along with putty.    Ended session with pt seated in recliner with all needs within reach.   Therapy Documentation Precautions:  Precautions Precautions: Fall Precaution Comments: L side hemipareisis with reduced coordination (pt has progressed since evaluation, but still mild coordination deficits) Restrictions Weight Bearing Restrictions: No  Pain: no pain   Therapy/Group: Individual Therapy  Barron Schmid 08/20/2023, 12:06 PM

## 2023-08-20 NOTE — Progress Notes (Addendum)
Physical Therapy Session Note  Patient Details  Name: Priscilla Wilson MRN: [id number] Date of Birth: 10-May-1951  Today's Date: 08/20/2023 PT Individual Time: 1022-1135 PT Individual Time Calculation (min): 73 min   Short Term Goals: Week 2:  PT Short Term Goal 1 (Week 2): STG = LTG d/t ELOS  Skilled Therapeutic Interventions/Progress Updates: Patient sitting in recliner on entrance to room. Pt alert and agreeable to PT session.   Pt reported no pain. This sessions primary focus included grad day activities/questions, updating outcome measures, and ambulation on noncompliant surfaces to mimic ascending/descending drive way at pt's home. Pt has demonstrated increase in functional mobility by improving BERG balance score from 43/56 to 50/56 since evaluation, and improving time on 5 x sit to stand test from 18 seconds to 9 seconds. Pt performed various dynamic standing activities to assess current balance presentation (picking up weighted objects outside BOS, forward reaching, laterally reaching and reaching across midline without AD or UE support). Pt demos close supervision without AD during these dynamic standing activities. Pt ambulated to [facility] in RW without WC follow, and back to ortho gym with supervision and VC to decrease step length on L LE, and to increase eversion with cue of "bring L pinky toe out to L side of RW" per slight inverted presentation during swing phase. Pt back in ortho gym for PTA to grab 7.5 ankle weights. Pt transported back to [facility] to ascend/descend on noncompliant surface to mimic driveway at home. Pt started at bottom of slight hill with B ankle weights on B LE's. Pt with close supervision/CGA for safety throughout, and VC to improve hip flexion. Pt reported that driveway at home is steeper than what was being practiced on, hence  adding 7.5 ankle weight bilaterally with cues and education that pt will need to increase step clearance and dorsiflexion. Pt understood cueing and  reasoning to intervention. Pt performed 2 laps up and down without rest break for a total of 150'+. Pt transported back to room in [facility] for time management and performed HEP that PTA provided with instructional cues for sequence.   Access Code: [email or web address] URL: [email or web address] Date: 08/20/2023 Prepared by: Bonnita Hollow  Exercises - Seated Hip Flexion on Left with Theraband  - 1 x daily - 7 x weekly - 3 sets - 10 reps - 3 second hold - Modified: Seated Hip Abduction with Theraband + Lateral Heel Taps  - 1 x daily - 7 x weekly - 3 sets - 10 reps  Patient sitting in recliner at end of session and was made modI in room this date with all needs in reach.   Therapy Documentation Precautions:  Precautions Precautions: Fall Precaution Comments: L side hemipareisis with reduced coordination (pt has progressed since evaluation, but still mild coordination deficits) Restrictions Weight Bearing Restrictions: No  Therapy/Group: Individual Therapy  Nefertari Rebman PTA 08/20/2023, 1:47 PM

## 2023-08-20 NOTE — Progress Notes (Addendum)
Physical Therapy Session Note  Patient Details  Name: Priscilla Wilson MRN: [id number] Date of Birth: Apr 07, 1951  Today's Date: 08/19/2023 PT Individual Time:  1302-1400  PT Individual Time Calculation (min): 58 min  Short Term Goals: Week 1:  PT Short Term Goal 1 (Week 1): Pt will perform functional standing transfers with light CGA using LRAD. PT Short Term Goal 1 - Progress (Week 1): Met PT Short Term Goal 2 (Week 1): Pt will ambulate community distances using LRAD with consistent supervision and min vc for technique. PT Short Term Goal 2 - Progress (Week 1): Met PT Short Term Goal 3 (Week 1): Pt will complete appropriate outcome measures. PT Short Term Goal 3 - Progress (Week 1): Met Week 2:   STG = LTG d/t ELOS  Skilled Therapeutic Interventions/Progress Updates:  Patient seated upright on EOB on entrance to room. Patient alert and agreeable to PT session. Husband and dtr present for family education.   Patient with no pain complaint at start of session.  Therapeutic Activity: Bed Mobility: in ADL apartment, pt performed supine <> sit with IND. Dtr questions room next to bed and pt's husband relates that he has been clearing paths and areas for increased safety in mobilizing home.   Transfers: Pt performed sit<>stand and stand pivot transfers throughout session with distant supervision/ Mod I. No vc provided to pt. Education to family re: pt not needing physical assist for most mobility. Only when balance challenges pt's coordination. And pt able to relate when she may need CGA vs supervision.   Gait Training:  Pt ambulated at most >200 ft and shorter distances simulating home environment distances with and without RW with supervision from therapist and education provided to husband and dtr to provide close supervision with RW and CGA with proper hand positioning on gait belt if ambulating shorter distances with no RW. Both demonstrate good ability to position self to L of pt and then  to observe pt and watch for L foot catch and/ or excessive lean to R. Provided vc/ tc for technique throughout session.   Completes car transfer with dtr and husband both providing supervision and pt able to complete at low SUV and mod height SUV levels. Family appreciative of info re: potential need for car cane to assist with boost into seat if seat height is difficult.   Stairs negotiated with dtr providing supervision to ascend and CGA to descend. Pt able to use BUE on L side HR to ascend with good foot clearance, positioning and technique. Pt lightly fearful on descent but wanting to perform and completes with HR to pt's R side with one UE support initially then Bil hands to R rail. Pt is able to relate to dtr that prior to start of ascent, dtr will have to bring RW to top of steps. Also prior to descent, will need to bring RW to bottom of steps and recommend to leave to side for dtr's safety in descending steps backwards, then repositioning to front for pt. Pt able to clear L foot at each step.   Husband is very happy to see pt's progress and he did not know how or if pt would progress since being in acute care.   Patient demonstrates increased fall risk as noted by score of  50 /56 on Berg Balance Scale.  (<36= high risk for falls, close to 100%; 37-45 significant >80%; 46-51 moderate >50%; 52-55 lower >25%). Score is significant improvement since testing one week ago with score of  43/ 50.  Patient seated on EOB at end of session with brakes locked, bed alarm set, and all needs within reach.   Therapy Documentation Precautions:  Precautions Precautions: Fall Precaution Comments: L side hemipareisis with reduced coordination Restrictions Weight Bearing Restrictions: No  Pain:  No pain related this session.  Balance: Standardized Balance Assessment Standardized Balance Assessment: Berg Balance Test Berg Balance Test Sit to Stand: Able to stand without using hands and stabilize  independently Standing Unsupported: Able to stand safely 2 minutes Sitting with Back Unsupported but Feet Supported on Floor or Stool: Able to sit safely and securely 2 minutes Stand to Sit: Sits safely with minimal use of hands Transfers: Able to transfer safely, minor use of hands Standing Unsupported with Eyes Closed: Able to stand 10 seconds safely Standing Ubsupported with Feet Together: Able to place feet together independently and stand 1 minute safely From Standing, Reach Forward with Outstretched Arm: Can reach forward >12 cm safely (5") From Standing Position, Pick up Object from Floor: Able to pick up shoe safely and easily From Standing Position, Turn to Look Behind Over each Shoulder: Looks behind from both sides and weight shifts well Turn 360 Degrees: Able to turn 360 degrees safely but slowly Standing Unsupported, Alternately Place Feet on Step/Stool: Able to stand independently and complete 8 steps >20 seconds Standing Unsupported, One Foot in Front: Able to plae foot ahead of the other independently and hold 30 seconds Standing on One Leg: Able to lift leg independently and hold 5-10 seconds Total Score: 50  Therapy/Group: Individual Therapy  Loel Dubonnet PT, DPT, CSRS 08/19/2023, 7:01 AM

## 2023-08-20 NOTE — Progress Notes (Signed)
Physical Therapy Discharge Summary  Patient Details  Name: Priscilla Wilson MRN: [id number] Date of Birth: 12-22-1950  Date of Discharge from PT service:August 20, 2023  Patient has met 12 of 12 long term goals due to improved activity tolerance, improved balance, increased strength, improved awareness, and improved coordination.  Patient to discharge at an ambulatory level Supervision.   Patient's care partner is independent to provide the necessary physical assistance at discharge.  Reasons goals not met: n/a  Recommendation:  Patient will benefit from ongoing skilled PT services in neuro outpatient setting to continue to advance safe functional mobility, address ongoing impairments in strength, coordination, balance, activity tolerance, cognition, safety awareness, and to minimize fall risk.  Equipment: RW  Reasons for discharge: treatment goals met and discharge from hospital  Patient/family agrees with progress made and goals achieved: Yes  PT Discharge Precautions/Restrictions Precautions Precautions: Fall Precaution Comments: L side hemipareisis with reduced coordination (pt has progressed since evaluation, but still mild coordination deficits) Restrictions Weight Bearing Restrictions: No  Pain Pain Assessment Pain Scale: 0-10 Pain Score: 0-No pain Pain Interference Pain Interference Pain Effect on Sleep: 0. Does not apply - I have not had any pain or hurting in the past 5 days Pain Interference with Therapy Activities: 0. Does not apply - I have not received rehabilitationtherapy in the past 5 days (No pain at all the past 5 days) Pain Interference with Day-to-Day Activities: 1. Rarely or not at all Vision/Perception  Vision - History Ability to See in Adequate Light: 0 Adequate Perception Perception: Within Functional Limits Praxis Praxis: Impaired Praxis Impairment Details: Motor planning  Cognition Overall Cognitive Status: Within Functional Limits for tasks  assessed Arousal/Alertness: Awake/alert Orientation Level: Oriented X4 Sensation Sensation Light Touch: Appears Intact Hot/Cold: Appears Intact Proprioception: Appears Intact Stereognosis: Impaired Detail Stereognosis Impaired Details: Impaired LUE Coordination Gross Motor Movements are Fluid and Coordinated: No Fine Motor Movements are Fluid and Coordinated: No Finger Nose Finger Test: L side dysmetria Heel Shin Test: WNL Motor  Motor Motor: Ataxia Motor - Discharge Observations: L hemi with improved ataxia  Mobility Bed Mobility Bed Mobility: Supine to Sit;Sit to Supine Supine to Sit: Independent with assistive device Sit to Supine: Independent with assistive device Transfers Transfers: Sit to Stand;Stand to Sit;Stand Pivot Transfers Sit to Stand: Independent with assistive device Stand to Sit: Independent with assistive device Stand Pivot Transfers: Independent with assistive device Transfer (Assistive device): Rolling walker Locomotion  Gait Ambulation: Yes Gait Assistance: Supervision/Verbal cueing Assistive device: Rolling walker Gait Assistance Details: Verbal cues for sequencing;Verbal cues for safe use of DME/AE Gait Gait: Yes Gait Pattern: Ataxic;Narrow base of support;Poor foot clearance - left;Poor foot clearance - right;Step-through pattern Gait velocity: decreased but Improved Stairs / Additional Locomotion Stairs: Yes Stairs Assistance: Supervision/Verbal cueing Stair Management Technique: One rail Left Number of Stairs: 12 Height of Stairs: 6 Ramp: Supervision/Verbal cueing Curb: Supervision/Verbal cueing Wheelchair Mobility Wheelchair Mobility: No  Trunk/Postural Assessment  Cervical Assessment Cervical Assessment: Within Functional Limits Thoracic Assessment Thoracic Assessment: Within Functional Limits Lumbar Assessment Lumbar Assessment: Within Functional Limits Postural Control Postural Control: Within Functional Limits   Balance Balance Balance Assessed: Yes Static Sitting Balance Static Sitting - Balance Support: No upper extremity supported;Feet supported Static Sitting - Level of Assistance: 7: Independent Dynamic Sitting Balance Dynamic Sitting - Balance Support: No upper extremity supported;Feet supported Dynamic Sitting - Level of Assistance: 7: Independent Dynamic Sitting - Balance Activities: Forward lean/weight shifting;Ball toss;Reaching across midline;Lateral lean/weight shifting Static Standing Balance Static Standing - Balance  Support: No upper extremity supported Static Standing - Level of Assistance: 5: Stand by assistance Dynamic Standing Balance Dynamic Standing - Balance Support: Bilateral upper extremity supported;During functional activity Dynamic Standing - Level of Assistance: 5: Stand by assistance Dynamic Standing - Balance Activities: Reaching across midline;Compliant surfaces;Reaching for objects;Forward lean/weight shifting Extremity Assessment      RLE Assessment RLE Assessment: Within Functional Limits LLE Assessment LLE Assessment: Exceptions to [facility] LLE Strength LLE Overall Strength: Deficits Left Hip Flexion: 4/5 Left Hip Extension: 4+/5 Left Hip ABduction: 5/5 Left Hip ADduction: 5/5 Left Knee Flexion: 5/5 Left Knee Extension: 5/5 Left Ankle Dorsiflexion: 4+/5 Left Ankle Plantar Flexion: 5/5   Dominic Sandoval PTA Loel Dubonnet PT, DPT, CSRS 08/20/2023, 1:21 PM

## 2023-08-20 NOTE — Progress Notes (Signed)
PROGRESS NOTE   Subjective/Complaints:  Fingertips still numb, no other c/os  ROS- denies CP, SOB, N/V/D  Objective:   No results found. No results for input(s): "WBC", "HGB", "HCT", "PLT" in the last 72 hours.   No results for input(s): "NA", "K", "CL", "CO2", "GLUCOSE", "BUN", "CREATININE", "CALCIUM" in the last 72 hours.    Intake/Output Summary (Last 24 hours) at 08/20/2023 0756 Last data filed at 08/19/2023 1836 Gross per 24 hour  Intake 840 ml  Output --  Net 840 ml        Physical Exam: Vital Signs Blood pressure 116/81, pulse 65, temperature 98 F (36.7 C), resp. rate 18, height 5' (1.524 m), weight 61.4 kg, SpO2 97%.   General: No acute distress, sitting in chair talking on the phone Mood and affect are appropriate Heart: Bradycardia Lungs: Clear to auscultation, breathing unlabored, no rales or wheezes Abdomen: Positive bowel sounds, soft nontender to palpation, nondistended Extremities: No clubbing, cyanosis, or edema Skin: No evidence of breakdown, no evidence of rash over exposed surfaces MsK: moving extremities antegravity  PRIOR EXAMS: Neurologic: Cranial nerves II through XII intact, motor strength is 5/5 in Right 4/5 left deltoid, bicep, tricep, grip, hip flexor, knee extensors, ankle dorsiflexor and plantar flexor Sensory exam normal sensation to light touch and proprioception in left  upper but feels different compared to RIght side Cerebellar exam mod dysmetria Left finger to nose to finger  Musculoskeletal: Full range of motion in all 4 extremities. No joint swelling   Assessment/Plan: 1. Functional deficits which require 3+ hours per day of interdisciplinary therapy in a comprehensive inpatient rehab setting. Physiatrist is providing close team supervision and 24 hour management of active medical problems listed below. Physiatrist and rehab team continue to assess barriers to  discharge/monitor patient progress toward functional and medical goals  Care Tool:  Bathing    Body parts bathed by patient: Right arm, Left arm, Chest, Abdomen, Front perineal area, Buttocks, Right upper leg, Face, Left lower leg, Right lower leg, Left upper leg         Bathing assist Assist Level: Supervision/Verbal cueing     Upper Body Dressing/Undressing Upper body dressing   What is the patient wearing?: Bra, Pull over shirt    Upper body assist Assist Level: Supervision/Verbal cueing    Lower Body Dressing/Undressing Lower body dressing      What is the patient wearing?: Underwear/pull up, Pants     Lower body assist Assist for lower body dressing: Supervision/Verbal cueing     Toileting Toileting    Toileting assist Assist for toileting: Supervision/Verbal cueing     Transfers Chair/bed transfer  Transfers assist     Chair/bed transfer assist level: Independent with assistive device     Locomotion Ambulation   Ambulation assist      Assist level: Supervision/Verbal cueing Assistive device: Walker-rolling Max distance: 1041   Walk 10 feet activity   Assist     Assist level: Supervision/Verbal cueing Assistive device: Walker-rolling   Walk 50 feet activity   Assist    Assist level: Supervision/Verbal cueing Assistive device: Walker-rolling    Walk 150 feet activity   Assist    Assist  level: Supervision/Verbal cueing Assistive device: Walker-rolling    Walk 10 feet on uneven surface  activity   Assist Walk 10 feet on uneven surfaces activity did not occur: Safety/medical concerns   Assist level: Supervision/Verbal cueing Assistive device: Walker-rolling   Wheelchair     Assist Is the patient using a wheelchair?: No Type of Wheelchair: Manual    Wheelchair assist level: Dependent - Patient 0% Max wheelchair distance: 200 ft    Wheelchair 50 feet with 2 turns activity    Assist        Assist Level:  Dependent - Patient 0%   Wheelchair 150 feet activity     Assist      Assist Level: Dependent - Patient 0%   Blood pressure 116/81, pulse 65, temperature 98 F (36.7 C), resp. rate 18, height 5' (1.524 m), weight 61.4 kg, SpO2 97%.  Medical Problem List and Plan: 1. Functional deficits secondary to acute right thalamic capsular infarct, etiology likely small vessel disease              -patient may shower             -ELOS/Goals: 08/21/23, supervision to mod I goals with PT and OT   -continue CIR   2.  Antithrombotics: -DVT/anticoagulation:  Pharmaceutical: off lovenox amb well             -antiplatelet therapy: Aspirin and Plavix for three weeks followed by aspirin alone (stop Plavix 10/17)   3. Pain Management: Tylenol as needed   4. Mood/Behavior/Sleep: LCSW to evaluate and provide emotional support             -pt appears positive and up beat. Wants to regain independence!             -continue Effexor-XR 75 mg q AM             -antipsychotic agents: n/a   5. Neuropsych/cognition: This patient is capable of making decisions on her own behalf.   6. Skin/Wound Care: Routine skin care checks   7. Fluids/Electrolytes/Nutrition: Routine Is and Os and follow-up chemistries   8: Hypertension: monitor TID and prn             -continue Lopressor 25 mg BID  Elevated  10/4- may need to increase lopressor if HR is >70  -08/15/23 BPs a bit better, monitor for now.   -08/16/23 BP variable but mostly 130s SBP; HR variable 60-70s; will hold off on changing lopressor for now, BPs are mostly stable.  Improved today 10/9 Vitals:   08/19/23 1947 08/20/23 0509  BP: 129/75 116/81  Pulse: 70 65  Resp: 18 18  Temp: 98.6 F (37 C) 98 F (36.7 C)  SpO2: 98% 97%   Vitals:   08/17/23 0303 08/17/23 1427 08/17/23 1928 08/18/23 0515  BP: (!) 149/68 126/61 128/78 (!) 144/77   08/18/23 1503 08/18/23 2013 08/19/23 0258 08/19/23 0516  BP: 122/69 (!) 153/88 139/80 132/67   08/19/23 0904  08/19/23 1504 08/19/23 1947 08/20/23 0509  BP: (!) 147/76 (!) 108/94 129/75 116/81     9: Hyperlipidemia: continue statin- LDL is 88 at goal    10: GERD: continue home famotidine   11: Hyperthyroidism with palpitations: continue methimazole and BB   12: History of asthma: continue albuterol MDI as needed   13: Prediabetes: carb modified diet- continue reg diet                 LOS: 10 days A FACE TO  FACE EVALUATION WAS PERFORMED  Erick Colace 08/20/2023, 7:56 AM

## 2023-08-20 NOTE — Plan of Care (Signed)
  Problem: Consults Goal: RH STROKE PATIENT EDUCATION Description: See Patient Education module for education specifics  Outcome: Progressing   Problem: RH BOWEL ELIMINATION Goal: RH STG MANAGE BOWEL WITH ASSISTANCE Description: STG Manage Bowel with  mod I Assistance. Outcome: Progressing Goal: RH STG MANAGE BOWEL W/MEDICATION W/ASSISTANCE Description: STG Manage Bowel with Medication with mod I Assistance. Outcome: Progressing   Problem: RH SAFETY Goal: RH STG ADHERE TO SAFETY PRECAUTIONS W/ASSISTANCE/DEVICE Description: STG Adhere to Safety Precautions With  cues Assistance/Device. Outcome: Progressing   Problem: RH KNOWLEDGE DEFICIT Goal: RH STG INCREASE KNOWLEDGE OF DIABETES Description: Patient and spouse will be able to manage prediabetes with dietary modification using educational resources independently Outcome: Progressing Goal: RH STG INCREASE KNOWLEDGE OF HYPERTENSION Description: Patient and spouse will be able to manage HTN with medications and dietary modification using educational resources independently Outcome: Progressing Goal: RH STG INCREASE KNOWLEGDE OF HYPERLIPIDEMIA Description: Patient and spouse will be able to manage HLD with medications and dietary modification using educational resources independently Outcome: Progressing Goal: RH STG INCREASE KNOWLEDGE OF STROKE PROPHYLAXIS Description: Patient and spouse will be able to manage secondary risks with medications and dietary modification using educational resources independently Outcome: Progressing   Problem: Education: Goal: Knowledge of disease or condition will improve Outcome: Progressing Goal: Knowledge of secondary prevention will improve (MUST DOCUMENT ALL) Outcome: Progressing Goal: Knowledge of patient specific risk factors will improve Loraine Leriche N/A or DELETE if not current risk factor) Outcome: Progressing   Problem: Ischemic Stroke/TIA Tissue Perfusion: Goal: Complications of ischemic  stroke/TIA will be minimized Outcome: Progressing   Problem: Coping: Goal: Will verbalize positive feelings about self Outcome: Progressing Goal: Will identify appropriate support needs Outcome: Progressing   Problem: Health Behavior/Discharge Planning: Goal: Ability to manage health-related needs will improve Outcome: Progressing Goal: Goals will be collaboratively established with patient/family Outcome: Progressing   Problem: Self-Care: Goal: Ability to participate in self-care as condition permits will improve Outcome: Progressing Goal: Verbalization of feelings and concerns over difficulty with self-care will improve Outcome: Progressing Goal: Ability to communicate needs accurately will improve Outcome: Progressing   Problem: Nutrition: Goal: Risk of aspiration will decrease Outcome: Progressing Goal: Dietary intake will improve Outcome: Progressing

## 2023-08-20 NOTE — Group Note (Signed)
Patient Details Name: Priscilla Wilson MRN: [id number] DOB: 01-12-1951 Today's Date: 08/20/2023  Time Calculation: OT Group Time Calculation OT Group Start Time: 1430 OT Group Stop Time: 1530 OT Group Time Calculation (min): 60 min      Group Description: Dance Group: Pt participated in dance group with an emphasis on social interaction, motor planning, increasing overall activity tolerance and bimanual tasks. All songs were selected by group members. Dance moves included AROM of BUE/BLE gross motor movements with an emphasis on building functional endurance.    Individual level documentation: Patient completed group from sitting level. Patientt needed supervision to complete various dance moves with cues body moves.  Patient able to create her own modifications modifications during group.  Pain:  0/10   Precautions:  Falls   Clide Deutscher 08/20/2023, 3:38 PM

## 2023-08-20 NOTE — Patient Care Conference (Signed)
Inpatient RehabilitationTeam Conference and Plan of Care Update Date: 08/19/2023   Time: 1002 am    Patient Name: Priscilla Wilson      Medical Record Number: [id number]  Date of Birth: 06-16-1951 Sex: Female         Room/Bed: 4M06C/4M06C-01 Payor Info: Payor: [employer]na [employer] AD[employer]ANTAGE / Plan: [employer] PPO / Product Type: *No Product type* /    Admit Date/Time:  08/10/2023  2:06 PM  Primary Diagnosis:  Acute CVA (cerebrovascular accident) [facility])  Hospital Problems: Principal Problem:   Acute CVA (cerebrovascular accident) [facility]) Active Problems:   Essential hypertension   Alteration of sensations, post-stroke    Expected Discharge Date: Expected Discharge Date: 08/21/23  Team Members Present: Physician leading conference: Dr. Claudette Laws Social Worker Present: Lavera Guise, BSW Nurse Present: Chana Bode, RN PT Present: Ralph Leyden, PT OT Present: Primitivo Gauze, OT SLP Present: Everardo Pacific, SLP PPS Coordinator present : Fae Pippin, SLP     Current Status/Progress Goal Weekly Team Focus  Bowel/Bladder   Patient is continent of bladder/and bowel   Remain continent   Will Assess toileting needs QS/PRN    Swallow/Nutrition/ Hydration               ADL's   supervision overall, focusing on L side coordination/sensation and strength   Mod Ind   coordination, balance, pt education    Mobility   Bed mobility = IND, transfers = close supervision, ambulation = up to 200' using RW with supervision, no AD with CGA, stairs = CGA.   Mod I/ supervision  Barriers: L hemipareisis and continued but improved ataxic movements /// Work on: general and L sided strengthening, L hemi NMR, conscious focus to intended movements, reducing needed LOA for transfers and gait, pt/family education    Communication                Safety/Cognition/ Behavioral Observations               Pain   Denies pain   Will be painfree whileon IP Rehab   Continue to  assess pain QS and prn    Skin   Skin intact   Skin integrity remains intact while on IP Rehab  Skin will be assess QS/PRN      Discharge Planning:  Discharging home with spouse on Friday. No barriers.   Team Discussion: Post Right Thalamic CVA with ataxia and sensory deficits.  Patient on target to meet rehab goals: yes, Currently mod I A for ADLs. Close supervision to Mod I for transfers. Able to ambulate up to 200 ft using RW, and completes steps with CGA.   *See Care Plan and progress notes for long and short-term goals.   Revisions to Treatment Plan:  N/A  Teaching Needs: Safety, Medications, Dietary modification, Toileting, transfers,etc.   Current Barriers to Discharge: None  Possible Resolutions to Barriers: Family Education OP follow up services DME- Youth walker     Medical Summary Current Status: HTN control imprving  Barriers to Discharge: Other (comments)  Barriers to Discharge Comments: sensory ataxia LUE Possible Resolutions to [employer] Focus: arrange post discharge f/u       I attest that I was present, lead the team conference, and concur with the assessment and plan of the team.   Felisa Bonier Barre Aydelott 08/20/2023, 12:05 PM

## 2023-08-20 NOTE — Progress Notes (Signed)
Occupational Therapy Discharge Summary  Patient Details  Name: Sai Moura MRN: [id number] Date of Birth: 09-Aug-1951  Date of Discharge from OT service:August 21, 2023  Today's Date: 08/21/2023  Patient has met 12 of 12 long term goals due to improved activity tolerance, improved balance, postural control, ability to compensate for deficits, functional use of  LEFT upper and LEFT lower extremity, and improved coordination. Pt has made significant progress during LOS with pt completing ambulatory ADLs at [facility] level. Pt demonstrates functional use of LUE during ADLs but continues to present with mild ataxia during functional reaching however pt incorporates compensatory strategies appropriately during ADLs. Pts husband was present for family education and demonstrated competency with assisting pt as needed.  Patient to discharge at overall Modified Independent level.  Patient's care partner is independent to provide the necessary physical assistance at discharge.    Reasons goals not met: NA  Recommendation:  Patient will benefit from ongoing skilled OT services in outpatient setting to continue to advance functional skills in the area of iADL. Pt will benefit from aquatic therapy.   Equipment: NA  Reasons for discharge: treatment goals met  Patient/family agrees with progress made and goals achieved: Yes  OT Discharge  ADL ADL Eating: Independent Where Assessed-Eating: Chair Grooming: Modified independent Where Assessed-Grooming: Standing at sink Upper Body Bathing: Modified independent Where Assessed-Upper Body Bathing: Shower Lower Body Bathing: Modified independent Where Assessed-Lower Body Bathing: Shower Upper Body Dressing: Independent Where Assessed-Upper Body Dressing: Edge of bed Lower Body Dressing: Modified independent Where Assessed-Lower Body Dressing: Edge of bed Toileting: Modified independent Where Assessed-Toileting: Teacher, adult education: Sales promotion account executive Method: Proofreader: Acupuncturist: Close supervision Film/video editor Method: Designer, industrial/product: Emergency planning/management officer ADL Comments: supervision overall except min A to fasten bra and CGA with ADL transfers Vision Baseline Vision/History: 1 Wears glasses Patient Visual Report: No change from baseline Perception  Perception: Within Functional Limits Praxis Praxis: Impaired Praxis Impairment Details: Motor planning Cognition Cognition Overall Cognitive Status: Within Functional Limits for tasks assessed Arousal/Alertness: Awake/alert Orientation Level: Person;Place;Situation Memory: Appears intact Awareness: Appears intact Problem Solving: Appears intact Brief Interview for Mental Status (BIMS) Repetition of Three Words (First Attempt): 3 Temporal Orientation: Year: Correct Temporal Orientation: Month: Accurate within 5 days Temporal Orientation: Day: Correct Recall: "Sock": Yes, no cue required Recall: "Blue": Yes, no cue required Recall: "Bed": Yes, no cue required BIMS Summary Score: 15 Sensation Sensation Light Touch: Appears Intact Hot/Cold: Appears Intact Proprioception: Appears Intact Stereognosis: Impaired Detail Stereognosis Impaired Details: Impaired LUE Coordination Gross Motor Movements are Fluid and Coordinated: No Fine Motor Movements are Fluid and Coordinated: No 9 Hole Peg Test: RUE- 26 LUE- 1 min 55 secs Motor  Motor Motor: Ataxia Motor - Discharge Observations: L hemi with improved ataxia Mobility  Bed Mobility Bed Mobility: Supine to Sit;Sit to Supine Supine to Sit: Independent with assistive device Sit to Supine: Independent with assistive device Transfers Sit to Stand: Independent with assistive device Stand to Sit: Independent with assistive device  Trunk/Postural Assessment  Cervical Assessment Cervical Assessment: Within Functional  Limits Thoracic Assessment Thoracic Assessment: Within Functional Limits Lumbar Assessment Lumbar Assessment: Within Functional Limits Postural Control Postural Control: Within Functional Limits  Balance Standardized Balance Assessment Standardized Balance Assessment: Berg Balance Test Berg Balance Test Sit to Stand: Able to stand without using hands and stabilize independently Standing Unsupported: Able to stand safely 2 minutes Sitting with Back Unsupported but Feet Supported on Floor  or Stool: Able to sit safely and securely 2 minutes Stand to Sit: Sits safely with minimal use of hands Transfers: Able to transfer safely, minor use of hands Standing Unsupported with Eyes Closed: Able to stand 10 seconds safely Standing Ubsupported with Feet Together: Able to place feet together independently and stand 1 minute safely From Standing, Reach Forward with Outstretched Arm: Can reach forward >12 cm safely (5") From Standing Position, Pick up Object from Floor: Able to pick up shoe safely and easily From Standing Position, Turn to Look Behind Over each Shoulder: Looks behind from both sides and weight shifts well Turn 360 Degrees: Able to turn 360 degrees safely but slowly Standing Unsupported, Alternately Place Feet on Step/Stool: Able to stand independently and complete 8 steps >20 seconds Standing Unsupported, One Foot in Front: Able to plae foot ahead of the other independently and hold 30 seconds Standing on One Leg: Able to lift leg independently and hold 5-10 seconds Total Score: 50 Extremity/Trunk Assessment RUE Assessment RUE Assessment: Within Functional Limits LUE Assessment LUE Assessment: Exceptions to [facility] Active Range of Motion (AROM) Comments: [facility] Strength Comments: sh flexion 4-/5, grasp 4-/5   Barron Schmid 08/20/2023, 8:03 AM

## 2023-08-21 NOTE — Plan of Care (Signed)
  Problem: Education: Goal: Knowledge of disease or condition will improve Outcome: Completed/Met Goal: Knowledge of secondary prevention will improve (MUST DOCUMENT ALL) Outcome: Completed/Met Goal: Knowledge of patient specific risk factors will improve (Mark N/A or DELETE if not current risk factor) Outcome: Completed/Met   Problem: Ischemic Stroke/TIA Tissue Perfusion: Goal: Complications of ischemic stroke/TIA will be minimized Outcome: Completed/Met   Problem: Coping: Goal: Will verbalize positive feelings about self Outcome: Completed/Met Goal: Will identify appropriate support needs Outcome: Completed/Met   Problem: Health Behavior/Discharge Planning: Goal: Ability to manage health-related needs will improve Outcome: Completed/Met Goal: Goals will be collaboratively established with patient/family Outcome: Completed/Met   Problem: Self-Care: Goal: Ability to participate in self-care as condition permits will improve Outcome: Completed/Met Goal: Verbalization of feelings and concerns over difficulty with self-care will improve Outcome: Completed/Met Goal: Ability to communicate needs accurately will improve Outcome: Completed/Met   Problem: Nutrition: Goal: Risk of aspiration will decrease Outcome: Completed/Met Goal: Dietary intake will improve Outcome: Completed/Met   

## 2023-08-21 NOTE — Progress Notes (Signed)
PROGRESS NOTE   Subjective/Complaints:  Discussed no driving   ROS- denies CP, SOB, N/V/D  Objective:   No results found. No results for input(s): "WBC", "HGB", "HCT", "PLT" in the last 72 hours.   No results for input(s): "NA", "K", "CL", "CO2", "GLUCOSE", "BUN", "CREATININE", "CALCIUM" in the last 72 hours.    Intake/Output Summary (Last 24 hours) at 08/21/2023 0735 Last data filed at 08/20/2023 1810 Gross per 24 hour  Intake 720 ml  Output --  Net 720 ml        Physical Exam: Vital Signs Blood pressure 127/76, pulse 61, temperature 98.3 F (36.8 C), temperature source Oral, resp. rate 18, height 5' (1.524 m), weight 61.4 kg, SpO2 100%.   General: No acute distress, sitting in chair talking on the phone Mood and affect are appropriate Heart: Bradycardia Lungs: Clear to auscultation, breathing unlabored, no rales or wheezes Abdomen: Positive bowel sounds, soft nontender to palpation, nondistended Extremities: No clubbing, cyanosis, or edema Skin: No evidence of breakdown, no evidence of rash over exposed surfaces MsK: moving extremities antegravity  PRIOR EXAMS: Neurologic: Cranial nerves II through XII intact, motor strength is 5/5 in Right 4/5 left deltoid, bicep, tricep, grip, hip flexor, knee extensors, ankle dorsiflexor and plantar flexor Sensory exam normal sensation to light touch and proprioception in left  upper but feels different compared to RIght side Cerebellar exam mod dysmetria Left finger to nose to finger  Musculoskeletal: Full range of motion in all 4 extremities. No joint swelling   Assessment/Plan: 1. Functional deficits due to RIgh tCVA and Left hemiparesis  Stable for D/C today F/u PCP in 3-4 weeks F/u PM&R 2 weeks Neuro 71mo Cardiology has appt in Nov- Neuro rec 30d event monitor  See D/C summary See D/C instructions   Care Tool:  Bathing    Body parts bathed by patient:  Right arm, Left arm, Chest, Abdomen, Front perineal area, Buttocks, Right upper leg, Face, Left lower leg, Right lower leg, Left upper leg         Bathing assist Assist Level: Independent with assistive device     Upper Body Dressing/Undressing Upper body dressing   What is the patient wearing?: Bra, Pull over shirt    Upper body assist Assist Level: Independent    Lower Body Dressing/Undressing Lower body dressing      What is the patient wearing?: Underwear/pull up, Pants     Lower body assist Assist for lower body dressing: Independent with assitive device     Toileting Toileting    Toileting assist Assist for toileting: Independent with assistive device     Transfers Chair/bed transfer  Transfers assist     Chair/bed transfer assist level: Independent with assistive device     Locomotion Ambulation   Ambulation assist      Assist level: Supervision/Verbal cueing Assistive device: Walker-rolling Max distance: 1041   Walk 10 feet activity   Assist     Assist level: Supervision/Verbal cueing Assistive device: Walker-rolling   Walk 50 feet activity   Assist    Assist level: Supervision/Verbal cueing Assistive device: Walker-rolling    Walk 150 feet activity   Assist    Assist level:  Supervision/Verbal cueing Assistive device: Walker-rolling    Walk 10 feet on uneven surface  activity   Assist Walk 10 feet on uneven surfaces activity did not occur: Safety/medical concerns   Assist level: Supervision/Verbal cueing Assistive device: Walker-rolling   Wheelchair     Assist Is the patient using a wheelchair?: No Type of Wheelchair: Manual    Wheelchair assist level: Dependent - Patient 0% Max wheelchair distance: 200 ft    Wheelchair 50 feet with 2 turns activity    Assist        Assist Level: Dependent - Patient 0%   Wheelchair 150 feet activity     Assist      Assist Level: Dependent - Patient 0%    Blood pressure 127/76, pulse 61, temperature 98.3 F (36.8 C), temperature source Oral, resp. rate 18, height 5' (1.524 m), weight 61.4 kg, SpO2 100%.  Medical Problem List and Plan: 1. Functional deficits secondary to acute right thalamic capsular infarct, etiology likely small vessel disease              -patient may shower             -d/c: 08/21/23,     2.  Antithrombotics: -DVT/anticoagulation:  Pharmaceutical: off lovenox amb well             -antiplatelet therapy: Aspirin and Plavix for three weeks followed by aspirin alone (stop Plavix 10/17)   3. Pain Management: Tylenol as needed   4. Mood/Behavior/Sleep: LCSW to evaluate and provide emotional support             -pt appears positive and up beat. Wants to regain independence!             -continue Effexor-XR 75 mg q AM             -antipsychotic agents: n/a   5. Neuropsych/cognition: This patient is capable of making decisions on her own behalf.   6. Skin/Wound Care: Routine skin care checks   7. Fluids/Electrolytes/Nutrition: Routine Is and Os and follow-up chemistries   8: Hypertension: monitor TID and prn             -continue Lopressor 25 mg BID  Elevated  10/4- may need to increase lopressor if HR is >70  -08/15/23 BPs a bit better, monitor for now.   -08/16/23 BP variable but mostly 130s SBP; HR variable 60-70s; will hold off on changing lopressor for now, BPs are mostly stable.  Improved today 10/9 Vitals:   08/20/23 1955 08/21/23 0504  BP: 134/80 127/76  Pulse: 76 61  Resp: 18 18  Temp: 97.6 F (36.4 C) 98.3 F (36.8 C)  SpO2: 100% 100%   Vitals:   08/18/23 1503 08/18/23 2013 08/19/23 0258 08/19/23 0516  BP: 122/69 (!) 153/88 139/80 132/67   08/19/23 0904 08/19/23 1504 08/19/23 1947 08/20/23 0509  BP: (!) 147/76 (!) 108/94 129/75 116/81   08/20/23 0805 08/20/23 1251 08/20/23 1955 08/21/23 0504  BP: 123/63 131/65 134/80 127/76     9: Hyperlipidemia: continue statin- LDL is 88 at goal    10:  GERD: continue home famotidine   11: Hyperthyroidism with palpitations: continue methimazole and BB   12: History of asthma: continue albuterol MDI as needed   13: Prediabetes: carb modified diet- continue reg diet                 LOS: 11 days A FACE TO FACE EVALUATION WAS PERFORMED  Erick Colace 08/21/2023, 7:35 AM

## 2023-08-21 NOTE — Progress Notes (Signed)
Inpatient Rehabilitation Care Coordinator Discharge Note   Patient Details  Name: Priscilla Wilson MRN: [id number] Date of Birth: Mar 19, 1951   Discharge location: Home  Length of Stay: 11 Days  Discharge activity level: Sup/Min A  Home/community participation: Spouse  Patient response IE:PPIRJJ Literacy - How often do you need to have someone help you when you read instructions, pamphlets, or other written material from your doctor or pharmacy?: Never  Patient response OA:CZYSAY Isolation - How often do you feel lonely or isolated from those around you?: Never  Services provided included: MD, RD, PT, OT, SLP, RN, CM, TR, Pharmacy, Neuropsych, SW  Financial Services:  Field seismologist Utilized: [employer] [facility]  Choices offered to/list presented to: Patient and spouse  Follow-up services arranged:  Outpatient, DME    Outpatient Servicies: Neuro Rehab DME : [facility]    Patient response to transportation need: Is the patient able to respond to transportation needs?: Yes In the past 12 months, has lack of transportation kept you from medical appointments or from getting medications?: No In the past 12 months, has lack of transportation kept you from meetings, work, or from getting things needed for daily living?: No   Patient/Family verbalized understanding of follow-up arrangements:  Yes  Individual responsible for coordination of the follow-up plan: Patient/Spouse  Confirmed correct DME delivered: Priscilla Wilson 08/21/2023    Comments (or additional information):  Summary of Stay    Date/Time Discharge Planning CSW  08/18/23 1235 Discharging home with spouse on Friday. No barriers. CJB  08/11/23 1520 Discharging homr with spouse.[place] Kitchen 1level home, 4 steps CJB       Priscilla Wilson

## 2023-08-21 NOTE — Progress Notes (Addendum)
Inpatient Rehabilitation Discharge Medication Review by a Pharmacist  A complete drug regimen review was completed for this patient to identify any potential clinically significant medication issues.  High Risk Drug Classes Is patient taking? Indication by Medication  Antipsychotic No   Anticoagulant No   Antibiotic No   Opioid No   Antiplatelet Yes Aspirin 81 mg, clopidogrel for 3 weeks, then aspirin alone - CVA prophylaxis  Hypoglycemics/insulin No   Vasoactive Medication Yes Metoprolol - hypertedsion  Chemotherapy No   Other Yes Famotidine - GERD Melatonin - sleep Methimazole- hyperthyroidism Rosuvastatin - hyperlipidemia Venlafaxine XR - depression Multivitamin - supplement  PRNs: Acetaminophen - mild pain     Type of Medication Issue Identified Description of Issue Recommendation(s)  Drug Interaction(s) (clinically significant)     Duplicate Therapy     Allergy     No Medication Administration End Date     Incorrect Dose     Additional Drug Therapy Needed     Significant med changes from prior encounter (inform family/care partners about these prior to discharge). Changed from simvastatin to rosuvastatin. New aspirin, Clopidogrel and melatonin. Communicate changes with patient/family prior to discharge.  Other       Clinically significant medication issues were identified that warrant physician communication and completion of prescribed/recommended actions by midnight of the next day:  No  Pharmacist comments: - Aspirin 81 mg and Clopidogrel for 3 weeks, then aspirin alone. Stop time is in place.  Time spent performing this drug regimen review (minutes):  20   Dennie Fetters, [place] 08/21/2023 10:11 AM

## 2023-08-22 NOTE — Plan of Care (Signed)
  Problem: RH Balance Goal: LTG Patient will maintain dynamic standing balance (PT) Description: LTG:  Patient will maintain dynamic standing balance with assistance during mobility activities (PT) Outcome: Completed/Met Flowsheets (Taken 08/11/2023 1846) LTG: Pt will maintain dynamic standing balance during mobility activities with:: Supervision/Verbal cueing   Problem: Sit to Stand Goal: LTG:  Patient will perform sit to stand with assistance level (PT) Description: LTG:  Patient will perform sit to stand with assistance level (PT) Outcome: Completed/Met Flowsheets (Taken 08/11/2023 1846) LTG: PT will perform sit to stand in preparation for functional mobility with assistance level: Independent with assistive device   Problem: RH Bed Mobility Goal: LTG Patient will perform bed mobility with assist (PT) Description: LTG: Patient will perform bed mobility with assistance, with/without cues (PT). Outcome: Completed/Met Flowsheets (Taken 08/11/2023 1846) LTG: Pt will perform bed mobility with assistance level of: Independent   Problem: RH Bed to Chair Transfers Goal: LTG Patient will perform bed/chair transfers w/assist (PT) Description: LTG: Patient will perform bed to chair transfers with assistance (PT). Outcome: Completed/Met Flowsheets (Taken 08/11/2023 1846) LTG: Pt will perform Bed to Chair Transfers with assistance level: Independent with assistive device    Problem: RH Car Transfers Goal: LTG Patient will perform car transfers with assist (PT) Description: LTG: Patient will perform car transfers with assistance (PT). Outcome: Completed/Met Flowsheets (Taken 08/11/2023 1846) LTG: Pt will perform car transfers with assist:: Supervision/Verbal cueing   Problem: RH Ambulation Goal: LTG Patient will ambulate in home environment (PT) Description: LTG: Patient will ambulate in home environment, # of feet with assistance (PT). Outcome: Completed/Met Flowsheets (Taken 08/11/2023  1846) LTG: Pt will ambulate in home environ  assist needed:: Independent with assistive device LTG: Ambulation distance in home environment: up to 50' per bout using LRAD Goal: LTG Patient will ambulate in community environment (PT) Description: LTG: Patient will ambulate in community environment, # of feet with assistance (PT). Outcome: Completed/Met Flowsheets (Taken 08/11/2023 1846) LTG: Pt will ambulate in community environ  assist needed:: Supervision/Verbal cueing LTG: Ambulation distance in community environment: >300' per bout using LRAD   Problem: RH Stairs Goal: LTG Patient will ambulate up and down stairs w/assist (PT) Description: LTG: Patient will ambulate up and down # of stairs with assistance (PT) Outcome: Completed/Met Flowsheets (Taken 08/11/2023 1846) LTG: Pt will ambulate up/down stairs assist needed:: Supervision/Verbal cueing LTG: Pt will  ambulate up and down number of stairs: at least 4 steps using single HR as per home environment

## 2023-08-27 ENCOUNTER — Ambulatory Visit: Payer: PPO

## 2023-08-27 ENCOUNTER — Other Ambulatory Visit: Payer: Self-pay

## 2023-08-27 ENCOUNTER — Encounter: Payer: Self-pay | Admitting: Occupational Therapy

## 2023-08-27 ENCOUNTER — Ambulatory Visit: Payer: PPO | Attending: Physician Assistant | Admitting: Occupational Therapy

## 2023-08-27 DIAGNOSIS — R29898 Other symptoms and signs involving the musculoskeletal system: Secondary | ICD-10-CM | POA: Insufficient documentation

## 2023-08-27 DIAGNOSIS — I639 Cerebral infarction, unspecified: Secondary | ICD-10-CM | POA: Diagnosis not present

## 2023-08-27 DIAGNOSIS — R208 Other disturbances of skin sensation: Secondary | ICD-10-CM | POA: Diagnosis not present

## 2023-08-27 DIAGNOSIS — R2681 Unsteadiness on feet: Secondary | ICD-10-CM | POA: Diagnosis not present

## 2023-08-27 DIAGNOSIS — R1310 Dysphagia, unspecified: Secondary | ICD-10-CM | POA: Diagnosis not present

## 2023-08-27 DIAGNOSIS — R278 Other lack of coordination: Secondary | ICD-10-CM | POA: Insufficient documentation

## 2023-08-27 DIAGNOSIS — R29818 Other symptoms and signs involving the nervous system: Secondary | ICD-10-CM | POA: Insufficient documentation

## 2023-08-27 DIAGNOSIS — M6281 Muscle weakness (generalized): Secondary | ICD-10-CM | POA: Diagnosis not present

## 2023-08-27 NOTE — Therapy (Signed)
OUTPATIENT OCCUPATIONAL THERAPY NEURO EVALUATION  Patient Name: Priscilla Wilson MRN: [id number] DOB:June 01, 1951, 72 y.o., female Today's Date: 08/27/2023  PCP: Priscilla Hazel, MD  REFERRING PROVIDER: Charlton Amor, PA-C   END OF SESSION:  OT End of Session - 08/27/23 1555     Visit Number 1    Number of Visits 12    Date for OT Re-Evaluation 10/23/23    Authorization Type [employer] - follows Medicare guidelines    Progress Note Due on Visit 10    OT Start Time 1407    OT Stop Time 1452    OT Time Calculation (min) 45 min    Activity Tolerance Patient tolerated treatment well    Behavior During Therapy [name] for tasks assessed/performed             History reviewed. No pertinent past medical history. Past Surgical History:  Procedure Laterality Date   BREAST EXCISIONAL BIOPSY Left    benign   Patient Active Problem List   Diagnosis Date Noted   Essential hypertension 08/13/2023   Alteration of sensations, post-stroke 08/13/2023   Acute CVA (cerebrovascular accident) (HCC) 08/10/2023   TIA (transient ischemic attack) 08/03/2023   Stroke determined by clinical assessment (HCC) 08/03/2023    ONSET DATE: 08/14/23 (referral date), 08/03/23 (date of CVA)  REFERRING DIAG: I63.9 (ICD-10-CM) - Cerebral infarction, unspecified   THERAPY DIAG:  Other lack of coordination - Plan: Ot plan of care cert/re-cert  Muscle weakness (generalized) - Plan: Ot plan of care cert/re-cert  Other disturbances of skin sensation - Plan: Ot plan of care cert/re-cert  Other symptoms and signs involving the musculoskeletal system - Plan: Ot plan of care cert/re-cert  Other symptoms and signs involving the nervous system - Plan: Ot plan of care cert/re-cert  Rationale for Evaluation and Treatment: Rehabilitation  SUBJECTIVE:   SUBJECTIVE STATEMENT: Pt reports things are "going well."  Pt accompanied by: self and significant other (husband: Priscilla Wilson)  PERTINENT HISTORY: Acute  CVA, Essential HTN, alteration of sensation post-stroke, hyperthyroidism, asthma, hyperlipidemia, GERD, pre-diabetes  Per 08/21/23 D/C Summary: "Priscilla Wilson is a 72 y.o. female presented to the [facility] ED on 08/03/2023 with sudden onset of left-sided numbness and weakness... 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 2D echo LVEF 70-75%. Hgb A1C 6.3%."   PRECAUTIONS: None Pt reported "they say I'm not allowed to drive" since stroke.  WEIGHT BEARING RESTRICTIONS: No  PAIN:  Are you having pain? No  FALLS: Has patient fallen in last 6 months? No  LIVING ENVIRONMENT: Lives with: lives with their spouse Lives in: House/apartment Stairs: Yes: Internal: 16 steps; on left going up and External: 3 steps; bilateral but cannot reach both - Pt reported not needing to go upstairs during the day because all appliances and room accessible at home. Has following equipment at home: Dan Humphreys - 2 wheeled, shower chair, and Grab bars  PLOF: Independent, pt driving, pt retired  PATIENT GOALS: Pt reported "I don't have all my feeling back in L arm, but it's come a long way." "I want to be able to do all the things I did before." Pt reports wanting to get back to line dancing and driving, to "get rid of walker," and to "make Christmas cookies.  OBJECTIVE:  Note: Objective measures were completed at Evaluation unless otherwise noted.  HAND DOMINANCE: Right  ADLs: Overall ADLs: ind, sometimes with supervision for safety  Transfers/ambulation related to ADLs: ind with supervision Eating: ind Grooming: ind UB Dressing: ind, no difficulty with hooking bra, manipulating buttons, zippers LB Dressing: ind Toileting: ind with grab bar next to toilet Bathing: ind Tub Shower transfers: no difficulty; with supervision  Equipment: Walk in shower and walk-in  tub  IADLs: Shopping: dependent Light housekeeping: have not attempted some tasks (e.g. laundry, dishwashing), ind wiping table and pt reports she is "pretty sure I can do [employer], putting dishes in dishwasher]". Meal Prep: currently dependent because husband wants to complete tasks. Pt reports she may be able to complete cutting tasks. Pt reports difficulty carrying heavy objects, such as pan or tray.  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 Pt's spouse reports pt has some unsteadiness "from time-to-time" though reports it has improved since stroke. Pt reports some difficulty with balance when moving during some tasks (e.g. attempting to stand on one foot for LB dressing while leaning against bed). Pt generally uses the walker during the day.  POSTURE COMMENTS:  Ind, no difficulty , supports self   ACTIVITY TOLERANCE: Activity tolerance: Pt reported increased fatigue during the day, such as from re-learning tasks since the stroke, walking far distances, and completing hand exercises.  FUNCTIONAL OUTCOME MEASURES: FOTO: 55    UPPER EXTREMITY ROM:    Active ROM Right eval Left eval  Shoulder flexion [facility]  Shoulder abduction    Shoulder adduction    Shoulder extension    Shoulder internal rotation    Shoulder external rotation    Elbow flexion    Elbow extension    Wrist flexion    Wrist extension    Wrist ulnar deviation    Wrist radial deviation    Wrist pronation    Wrist supination    (Blank rows = not tested)  Note: OT noted slightly slower movements of LUE compared to RUE.  UPPER EXTREMITY MMT:     MMT Right eval Left eval  Shoulder flexion 4 4-  Shoulder abduction 4 4-  Shoulder adduction    Shoulder extension    Shoulder internal rotation    Shoulder external rotation    Middle trapezius     Lower trapezius    Elbow flexion    Elbow extension    Wrist flexion    Wrist extension    Wrist ulnar deviation    Wrist radial deviation    Wrist pronation    Wrist supination    (Blank rows = not tested)  HAND FUNCTION: Grip strength: Right: 34.9 lbs; Left: 43.8 lbs - average of 3 trials  OT noted increased shaking of LUE compared to RUE when pt grasped dynamometer with LUE.  COORDINATION: 9 Hole Peg test: Right: 18 sec; Left: 64 sec  Several drops with LUE, shaking of LUE.  SENSATION: Pt reports tips of fingers have difficulty with grading of force: e.g. "I can't tell how tight I'm holding something."   Pt reports impaired sensation of LUE "Like there is a veil over my left arm." Pt reports numbness of L axilla. Pt reports "tightness" of L bicep.  EDEMA: None noted  MUSCLE TONE: none noted  COGNITION: Overall cognitive status: Within functional limits for tasks assessed  VISION: Subjective report: Pt reports no changes in vision. Pt correctly read clock on wall. Baseline vision: Wears glasses all the time, Wears glasses for distance and reading. Visual history: cataracts -  pt reports doctor says cataracts are "starting" though no treatment yet.  VISION ASSESSMENT: Not tested Appeared [facility] when completing other assessment tasks   Patient has difficulty with following activities due to following visual impairments: none noted.  PERCEPTION: Not tested  PRAXIS: Not tested  OBSERVATIONS: Pt accompanied by husband. Pt appeared well-kept. Pt ambulated with 2-wheeled rolling walker with slow altered gait.   TODAY'S TREATMENT:                                                                                                                              DATE:   OT educated pt on the following: OT role, POC, recommended pt attempt household tasks with supervision and incorporate affected extremity in tasks, decreasing fall risk during tasks such as completing tasks  while seated or with supervision.  Pt and pt's spouse verbalized understanding.  PATIENT EDUCATION: Education details: See today's treatment Person educated: Patient and Spouse Education method: Explanation Education comprehension: verbalized understanding  HOME EXERCISE PROGRAM: Not yet initiated   GOALS: Goals reviewed with patient? Yes  SHORT TERM GOALS: Target date: 09/25/23  Patient will demonstrate improved FM coordination by completing nine-hole peg with use of LUE in 53 seconds or less with no more than 3 drops. Baseline: Left: 64 sec, Several drops with LUE Goal status: INITIAL  2.  Pt will demonstrate independence with initial HEP for coordination and theraputty of LUE. Baseline: New to outpt OT Goal status: INITIAL  3. Patient will demonstrate at least 5 lbs increase of LUE grip strength (average 3 trials) and stable LUE grasp as needed to open jars and other containers.  Baseline: Left: 43.8 lbs, shaking of LUE when grasping dynamometer  Goal status: INITIAL  4. Pt will report little difficulty picking a coin up from the table using LUE.  Baseline: Per FOTO, pt picks up coins from table "with much difficulty" Goal status: INITIAL   LONG TERM GOALS: Target date: 10/23/23  Patient will demonstrate improved FM coordination by completing nine-hole peg with use of LUE in 40 seconds or less with no more than 1 drop. Baseline: Left: 64 sec, Several drops with LUE Goal status: INITIAL  2.  Pt will demonstrate independence with updated HEP PRN. Baseline: New to outpt OT Goal status: INITIAL  3.  Pt will participate in cooking/baking task safely at mod I level. Baseline: pt has not attempted cooking/baking tasks since stroke Goal status: INITIAL  4.  Pt will report no more than mild difficulty with carrying an item in her left, affected hand to improve participation in light household tasks (e.g. dishwashing, laundry). Baseline: pt reports difficulty carrying heavy  items Goal status: INITIAL  5.  Pt will complete D/C FOTO Baseline: Intake FOTO completed at eval Goal status: INITIAL    ASSESSMENT:  CLINICAL IMPRESSION: Patient is a 72 y.o. female who was seen today for occupational therapy evaluation for acute CVA. Hx includes Essential HTN, alteration of sensation post-stroke,  hyperthyroidism, asthma, hyperlipidemia, GERD, pre-diabetes. Patient currently presents below baseline level of functioning demonstrating functional deficits and impairments as noted below. Pt would benefit from skilled OT services in the outpatient setting to work on impairments as noted below to help pt return to PLOF as able.     PERFORMANCE DEFICITS: in functional skills including ADLs, IADLs, coordination, dexterity, proprioception, sensation, ROM, strength, Fine motor control, Gross motor control, balance, body mechanics, endurance, and UE functional use, cognitive skills including energy/drive, and psychosocial skills including environmental adaptation.   IMPAIRMENTS: are limiting patient from ADLs, IADLs, leisure, and social participation.   CO-MORBIDITIES: may have co-morbidities  that affects occupational performance. Patient will benefit from skilled OT to address above impairments and improve overall function.  MODIFICATION OR ASSISTANCE TO COMPLETE EVALUATION: No modification of tasks or assist necessary to complete an evaluation.  OT OCCUPATIONAL PROFILE AND HISTORY: Problem focused assessment: Including review of records relating to presenting problem.  CLINICAL DECISION MAKING: Moderate - several treatment options, min-mod task modification necessary  REHAB POTENTIAL: Good  EVALUATION COMPLEXITY: Low    PLAN:  OT FREQUENCY: 1-2x/week  (2x per week initially, progress to 1x per week PRN if pt makes good progress)  OT DURATION: 6 weeks (POC extended 2 weeks to allow for scheduling)  PLANNED INTERVENTIONS: 78295 OT Re-evaluation, 97535 self care/ADL  training, 62130 therapeutic exercise, 97530 therapeutic activity, 97112 neuromuscular re-education, 97140 manual therapy, 97035 ultrasound, 97018 paraffin, 86578 fluidotherapy, 97010 moist heat, 97010 cryotherapy, 97032 electrical stimulation (manual), 97760 Orthotics management and training, 46962 Splinting (initial encounter), [id number] Subsequent splinting/medication, manual lymph drainage, passive range of motion, functional mobility training, visual/perceptual remediation/compensation, energy conservation, patient/family education, and DME and/or AE instructions  RECOMMENDED OTHER SERVICES: PT eval re-scheduled  CONSULTED AND AGREED WITH PLAN OF CARE: Patient and family member/caregiver  PLAN FOR NEXT SESSION:  Initiate Theraputty HEP Initiate Coordination HEP  Coordination tasks Practice ADL/IADL tasks with standing tolerance, dynamic balance, environmental scanning, multi-tasking - e.g. dishwashing, laundry   Wynetta Emery, OT 08/27/2023, 4:15 PM

## 2023-09-01 NOTE — Progress Notes (Unsigned)
   Subjective:    Patient ID: Priscilla Wilson, female    DOB: [birth year], 72 y.o.   MRN: [id number]  HPI: Freddy Cruell is a 72 y.o. female who returns for follow up appointment for chronic pain and medication refill. states *** pain is located in  ***. rates pain ***. current exercise regime is walking and performing stretching exercises.     Pain Inventory Average Pain 0 Pain Right Now 0 My pain is intermittent, tingling, and numbness  LOCATION OF PAIN  Left shoulder tinging, Left finger tips numbness, pain in ankles & hands  BOWEL Number of stools per week: 7-12 Oral laxative use No   BLADDER Normal    Mobility walk with assistance use a walker ability to climb steps?  yes do you drive?  no Do you have any goals in this area?  yes  Function retired  Neuro/Psych weakness numbness trouble walking  Prior Studies Any changes since last visit?  no  Physicians involved in your care Any changes since last visit?  no   Family History  Problem Relation Age of Onset   Cancer - Ovarian Sister    Social History   Socioeconomic History   Marital status: Married    Spouse name: Not on file   Number of children: Not on file   Years of education: Not on file   Highest education level: Not on file  Occupational History   Not on file  Tobacco Use   Smoking status: Never    Passive exposure: Past   Smokeless tobacco: Never  Vaping Use   Vaping status: Never Used  Substance and Sexual Activity   Alcohol use: Not Currently   Drug use: Never   Sexual activity: Yes  Other Topics Concern   Not on file  Social History Narrative   Not on file   Social Determinants of Health   Financial Resource Strain: Not on file  Food Insecurity: No Food Insecurity (08/05/2023)   Hunger Vital Sign    Worried About Running Out of Food in the Last Year: Never true    Ran Out of Food in the Last Year: Never true  Transportation Needs: No Transportation Needs (08/05/2023)    PRAPARE - Administrator, Civil Service (Medical): No    Lack of Transportation (Non-Medical): No  Physical Activity: Not on file  Stress: Not on file  Social Connections: Not on file   Past Surgical History:  Procedure Laterality Date   BREAST EXCISIONAL BIOPSY Left    benign   No past medical history on file. There were no vitals taken for this visit.  Opioid Risk Score:   Fall Risk Score:  `1  Depression screen PHQ 2/9      No data to display          Review of Systems  Musculoskeletal:  Positive for gait problem.       Pain in the ankles & hands  Neurological:  Positive for weakness and numbness.       Tingling  All other systems reviewed and are negative.      Objective:   Physical Exam        Assessment & Plan:

## 2023-09-02 ENCOUNTER — Encounter: Payer: PPO | Attending: Registered Nurse | Admitting: Registered Nurse

## 2023-09-02 ENCOUNTER — Encounter: Payer: Self-pay | Admitting: Registered Nurse

## 2023-09-02 VITALS — BP 127/73 | HR 60 | Ht 60.0 in | Wt 138.0 lb

## 2023-09-02 DIAGNOSIS — I10 Essential (primary) hypertension: Secondary | ICD-10-CM

## 2023-09-02 DIAGNOSIS — I639 Cerebral infarction, unspecified: Secondary | ICD-10-CM | POA: Diagnosis not present

## 2023-09-02 NOTE — Patient Instructions (Signed)
My- Chart:   [phone]  

## 2023-09-03 ENCOUNTER — Ambulatory Visit: Payer: PPO | Admitting: Occupational Therapy

## 2023-09-03 ENCOUNTER — Ambulatory Visit: Payer: PPO

## 2023-09-03 VITALS — BP 113/63 | HR 54

## 2023-09-03 DIAGNOSIS — M6281 Muscle weakness (generalized): Secondary | ICD-10-CM

## 2023-09-03 DIAGNOSIS — R29818 Other symptoms and signs involving the nervous system: Secondary | ICD-10-CM

## 2023-09-03 DIAGNOSIS — R2681 Unsteadiness on feet: Secondary | ICD-10-CM

## 2023-09-03 DIAGNOSIS — R208 Other disturbances of skin sensation: Secondary | ICD-10-CM

## 2023-09-03 DIAGNOSIS — R29898 Other symptoms and signs involving the musculoskeletal system: Secondary | ICD-10-CM

## 2023-09-03 DIAGNOSIS — R278 Other lack of coordination: Secondary | ICD-10-CM | POA: Diagnosis not present

## 2023-09-03 NOTE — Therapy (Signed)
OUTPATIENT OCCUPATIONAL THERAPY Treatment  Patient Name: Priscilla Wilson MRN: [id number] DOB:[birth year], 72 y.o., female Today's Date: 09/03/2023  PCP: Sigmund Hazel, MD  REFERRING PROVIDER: Charlton Amor, PA-C   END OF SESSION:  OT End of Session - 09/03/23 1207     Visit Number 2    Number of Visits 12    Date for OT Re-Evaluation 10/23/23    Authorization Type [employer] - follows Medicare guidelines    Progress Note Due on Visit 10    OT Start Time 1057    OT Stop Time 1144    OT Time Calculation (min) 47 min    Activity Tolerance Patient tolerated treatment well    Behavior During Therapy [facility] for tasks assessed/performed              No past medical history on file. Past Surgical History:  Procedure Laterality Date   BREAST EXCISIONAL BIOPSY Left    benign   Patient Active Problem List   Diagnosis Date Noted   Essential hypertension 08/13/2023   Alteration of sensations, post-stroke 08/13/2023   Acute CVA (cerebrovascular accident) (HCC) 08/10/2023   TIA (transient ischemic attack) 08/03/2023   Stroke determined by clinical assessment (HCC) 08/03/2023    ONSET DATE: 08/14/23 (referral date), 08/03/23 (date of CVA)  REFERRING DIAG: I63.9 (ICD-10-CM) - Cerebral infarction, unspecified   THERAPY DIAG:  Other lack of coordination  Muscle weakness (generalized)  Other disturbances of skin sensation  Other symptoms and signs involving the musculoskeletal system  Other symptoms and signs involving the nervous system  Rationale for Evaluation and Treatment: Rehabilitation  SUBJECTIVE:   SUBJECTIVE STATEMENT: Pt reports things continue to be going well. Pt reports things getting better every day, including walking better with and without walker. Pt reports legs "still swinging out some." Pt reports unsteadiness of hand when grasping at a distance.  Pt accompanied by: self and significant other (husband: Priscilla Wilson)  PERTINENT HISTORY: Acute CVA,  Essential HTN, alteration of sensation post-stroke, hyperthyroidism, asthma, hyperlipidemia, GERD, pre-diabetes  Per 08/21/23 D/C Summary: "Jina Galanos is a 72 y.o. female presented to the [facility] ED on 08/03/2023 with sudden onset of left-sided numbness and weakness... 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 2D echo LVEF 70-75%. Hgb A1C 6.3%."   PRECAUTIONS: None Pt reported "they say I'm not allowed to drive" since stroke.  WEIGHT BEARING RESTRICTIONS: No  PAIN:  Are you having pain? No  FALLS: Has patient fallen in last 6 months? No  LIVING ENVIRONMENT: Lives with: lives with their spouse Lives in: House/apartment Stairs: Yes: Internal: 16 steps; on left going up and External: 3 steps; bilateral but cannot reach both - Pt reported not needing to go upstairs during the day because all appliances and room accessible at home. Has following equipment at home: Dan Humphreys - 2 wheeled, shower chair, and Grab bars  PLOF: Independent, pt driving, pt retired  PATIENT GOALS: Pt reported "I don't have all my feeling back in L arm, but it's come a long way." "I want to be able to do all the things I did before." Pt reports wanting to get back to line dancing and driving, to "get rid of walker," and to "make Christmas cookies.  OBJECTIVE:  Note: Objective measures were completed at Evaluation unless otherwise noted.  HAND DOMINANCE: Right  ADLs: Overall ADLs: ind, sometimes with supervision for safety Transfers/ambulation related to  ADLs: ind with supervision Eating: ind Grooming: ind UB Dressing: ind, no difficulty with hooking bra, manipulating buttons, zippers LB Dressing: ind Toileting: ind with grab bar next to toilet Bathing: ind Tub Shower transfers: no difficulty; with supervision  Equipment: Walk in shower and walk-in  tub  IADLs: Shopping: dependent Light housekeeping: have not attempted some tasks (e.g. laundry, dishwashing), ind wiping table and pt reports she is "pretty sure I can do [employer], putting dishes in dishwasher]". Meal Prep: currently dependent because husband wants to complete tasks. Pt reports she may be able to complete cutting tasks. Pt reports difficulty carrying heavy objects, such as pan or tray.  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 Pt's spouse reports pt has some unsteadiness "from time-to-time" though reports it has improved since stroke. Pt reports some difficulty with balance when moving during some tasks (e.g. attempting to stand on one foot for LB dressing while leaning against bed). Pt generally uses the walker during the day.  POSTURE COMMENTS:  Ind, no difficulty , supports self   ACTIVITY TOLERANCE: Activity tolerance: Pt reported increased fatigue during the day, such as from re-learning tasks since the stroke, walking far distances, and completing hand exercises.  FUNCTIONAL OUTCOME MEASURES: FOTO: 55    UPPER EXTREMITY ROM:    Active ROM Right eval Left eval  Shoulder flexion [facility]  Shoulder abduction    Shoulder adduction    Shoulder extension    Shoulder internal rotation    Shoulder external rotation    Elbow flexion    Elbow extension    Wrist flexion    Wrist extension    Wrist ulnar deviation    Wrist radial deviation    Wrist pronation    Wrist supination    (Blank rows = not tested)  Note: OT noted slightly slower movements of LUE compared to RUE.  UPPER EXTREMITY MMT:     MMT Right eval Left eval  Shoulder flexion 4 4-  Shoulder abduction 4 4-  Shoulder adduction    Shoulder extension    Shoulder internal rotation    Shoulder external rotation    Middle trapezius     Lower trapezius    Elbow flexion    Elbow extension    Wrist flexion    Wrist extension    Wrist ulnar deviation    Wrist radial deviation    Wrist pronation    Wrist supination    (Blank rows = not tested)  HAND FUNCTION: Grip strength: Right: 34.9 lbs; Left: 43.8 lbs - average of 3 trials  OT noted increased shaking of LUE compared to RUE when pt grasped dynamometer with LUE.  COORDINATION: 9 Hole Peg test: Right: 18 sec; Left: 64 sec  Several drops with LUE, shaking of LUE.  SENSATION: Pt reports tips of fingers have difficulty with grading of force: e.g. "I can't tell how tight I'm holding something."   Pt reports impaired sensation of LUE "Like there is a veil over my left arm." Pt reports numbness of L axilla. Pt reports "tightness" of L bicep.  EDEMA: None noted  MUSCLE TONE: none noted  COGNITION: Overall cognitive status: Within functional limits for tasks assessed  VISION: Subjective report: Pt reports no changes in vision. Pt correctly read clock on wall. Baseline vision: Wears glasses all the time, Wears glasses for distance and reading. Visual history: cataracts - pt reports  doctor says cataracts are "starting" though no treatment yet.  VISION ASSESSMENT: Not tested Appeared [facility] when completing other assessment tasks   Patient has difficulty with following activities due to following visual impairments: none noted.  PERCEPTION: Not tested  PRAXIS: Not tested  OBSERVATIONS: Pt accompanied by husband. Pt appeared well-kept. Pt ambulated with 2-wheeled rolling walker with slow altered gait.   TODAY'S TREATMENT:                                                                                                                              DATE:   Neuro Re-Ed OT initiated LUE shoulder and pink theraputty HEP to improve seated posture, improve LUE proprioception, increase LUE strengthening, ROM, coordination. Pt demo'd understanding of  exercises.  Access Code: [email or web address] URL: [email or web address] Date: 09/03/2023 Prepared by: Carilyn Goodpasture  Exercises - Seated Shoulder Flexion Towel Slide at Table Top   - 10 reps, hold 5 seconds - OT provided v/c for joint protection, neutral spine.  - Seated Shoulder Abduction Towel Slide at Table Top   - 10 reps, hold 5 seconds - Pt c/o "ouchiness" (slight pinching) at L shoulder during ABD. OT educated pt on completing L shoulder ABD with decreased ROM in order to avoid pain, shoulder anatomy, shoulder muscle weakness after stroke which may impact shoulder stability. Pt verbalized and demo'd understanding.  - Seated Scapular Retraction  - 10 reps - 5 hold - OT provided v/c: "shoulders down then back."  - Seated Shoulder Shrug  - 10 reps - 5 hold  - Seated Shoulder Shrug Circles AROM Backward   - 10 reps - Pt reported "crackling" sound at L shoulder 2x though denied pain both instances. OT educated pt on stopping if there is sharp pain. Pt verbalized understanding.  - Putty Squeezes   - 10 reps - OT educated pt on joint protection. Pt verbalized understanding.  - Tip Pinch with Putty  - 10 reps - OT educated pt on joint protection, "o" shape with digits 1 and 2, alternate sets to focus on pinch strength and then attempting to grade force with LUE to create same depth in putty in order to improve proprioception, sensation, neuromuscular re-ed. Pt verbalized understanding.  - Marble Pick Up from Putty  - 10 reps - Pt removed x10 small pegs from putty to improve FM coordination, dexterity, increase strengthening. OT noted increased compensatory movements of LUE as evidenced by pt originating movements of LUE from shoulder with elbow elevated.  OT educated pt on joint protection strategies, sleep positioning to protect LUE. Pt verbalized understanding. Handouts provided, see pt instructions.  PATIENT EDUCATION: Education details: See today's treatment Person educated: Patient  and Spouse Education method: Explanation, Demonstration, Tactile cues, Verbal cues, and Handouts Education comprehension: verbalized understanding, returned demonstration, and needs further education  HOME EXERCISE PROGRAM: 09/03/23 - Shoulder and theraputty. Access Code: [email or web address]   GOALS: Goals reviewed with patient? Yes  SHORT TERM GOALS: Target date: 09/25/23  Patient will demonstrate improved FM coordination by completing nine-hole peg with use of LUE in 53 seconds or less with no more than 3 drops. Baseline: Left: 64 sec, Several drops with LUE Goal status: INITIAL  2.  Pt will demonstrate independence with initial HEP for coordination and theraputty of LUE. Baseline: New to outpt OT Goal status: INITIAL  3. Patient will demonstrate at least 5 lbs increase of LUE grip strength (average 3 trials) and stable LUE grasp as needed to open jars and other containers.  Baseline: Left: 43.8 lbs, shaking of LUE when grasping dynamometer  Goal status: INITIAL  4. Pt will report little difficulty picking a coin up from the table using LUE.  Baseline: Per FOTO, pt picks up coins from table "with much difficulty" Goal status: INITIAL   LONG TERM GOALS: Target date: 10/23/23  Patient will demonstrate improved FM coordination by completing nine-hole peg with use of LUE in 40 seconds or less with no more than 1 drop. Baseline: Left: 64 sec, Several drops with LUE Goal status: INITIAL  2.  Pt will demonstrate independence with updated HEP PRN. Baseline: New to outpt OT Goal status: INITIAL  3.  Pt will participate in cooking/baking task safely at mod I level. Baseline: pt has not attempted cooking/baking tasks since stroke Goal status: INITIAL  4.  Pt will report no more than mild difficulty with carrying an item in her left, affected hand to improve participation in light household tasks (e.g. dishwashing, laundry). Baseline: pt reports difficulty carrying heavy items Goal status:  INITIAL  5.  Pt will complete D/C FOTO Baseline: Intake FOTO completed at eval Goal status: INITIAL    ASSESSMENT:  CLINICAL IMPRESSION: Pt tolerated OT session tasks well today. Pt would benefit from skilled OT services in the outpatient setting to work on impairments as noted below to help pt return to PLOF as able.     PERFORMANCE DEFICITS: in functional skills including ADLs, IADLs, coordination, dexterity, proprioception, sensation, ROM, strength, Fine motor control, Gross motor control, balance, body mechanics, endurance, and UE functional use, cognitive skills including energy/drive, and psychosocial skills including environmental adaptation.   IMPAIRMENTS: are limiting patient from ADLs, IADLs, leisure, and social participation.   CO-MORBIDITIES: may have co-morbidities  that affects occupational performance. Patient will benefit from skilled OT to address above impairments and improve overall function.  MODIFICATION OR ASSISTANCE TO COMPLETE EVALUATION: No modification of tasks or assist necessary to complete an evaluation.  OT OCCUPATIONAL PROFILE AND HISTORY: Problem focused assessment: Including review of records relating to presenting problem.  CLINICAL DECISION MAKING: Moderate - several treatment options, min-mod task modification necessary  REHAB POTENTIAL: Good  EVALUATION COMPLEXITY: Low    PLAN:  OT FREQUENCY: 1-2x/week  (2x per week initially, progress to 1x per week PRN if pt makes good progress)  OT DURATION: 6 weeks (POC extended 2 weeks to allow for scheduling)  PLANNED INTERVENTIONS: 40981 OT Re-evaluation, 97535 self care/ADL training, 19147 therapeutic exercise, 97530 therapeutic activity, 97112 neuromuscular re-education, 97140 manual therapy, 97035 ultrasound, 97018 paraffin, 82956 fluidotherapy, 97010 moist heat, 97010 cryotherapy, 97032 electrical stimulation (manual), 97760 Orthotics management and training, 21308 Splinting (initial encounter),  [id number] Subsequent splinting/medication, manual lymph drainage, passive range of motion, functional mobility training, visual/perceptual remediation/compensation, energy conservation, patient/family education, and DME and/or AE instructions  RECOMMENDED OTHER SERVICES: PT eval re-scheduled  CONSULTED AND AGREED WITH PLAN OF CARE: Patient and family member/caregiver  PLAN FOR NEXT SESSION:  Pt reported upcoming baking task. How did  baking task go?   Initiate Coordination HEP  Coordination tasks  Practice ADL/IADL tasks with standing tolerance, dynamic balance, environmental scanning, multi-tasking - e.g. dishwashing, laundry   Wynetta Emery, OT 09/03/2023, 12:12 PM

## 2023-09-03 NOTE — Therapy (Signed)
OUTPATIENT PHYSICAL THERAPY NEURO EVALUATION   Patient Name: Priscilla Wilson MRN: [id number] DOB:February 14, 1951, 73 y.o., female Today's Date: 09/03/2023   PCP: Sigmund Hazel, MD REFERRING PROVIDER: Mariam Dollar, PA-C  END OF SESSION:  PT End of Session - 09/03/23 1014     Visit Number 1    Number of Visits 13    Date for PT Re-Evaluation 10/30/23    Authorization Type HTA    PT Start Time 1016    PT Stop Time 1052    PT Time Calculation (min) 36 min    Equipment Utilized During Treatment Gait belt    Activity Tolerance Patient tolerated treatment well    Behavior During Therapy WFL for tasks assessed/performed             History reviewed. No pertinent past medical history. Past Surgical History:  Procedure Laterality Date   BREAST EXCISIONAL BIOPSY Left    benign   Patient Active Problem List   Diagnosis Date Noted   Essential hypertension 08/13/2023   Alteration of sensations, post-stroke 08/13/2023   Acute CVA (cerebrovascular accident) (HCC) 08/10/2023   TIA (transient ischemic attack) 08/03/2023   Stroke determined by clinical assessment (HCC) 08/03/2023    ONSET DATE: 08/14/23 referral  REFERRING DIAG: I63.9 (ICD-10-CM) - Cerebral infarction, unspecified   THERAPY DIAG:  Other lack of coordination - Plan: PT plan of care cert/re-cert  Muscle weakness (generalized) - Plan: PT plan of care cert/re-cert  Unsteadiness on feet - Plan: PT plan of care cert/re-cert  Rationale for Evaluation and Treatment: Rehabilitation  SUBJECTIVE:                                                                                                                                                                                             SUBJECTIVE STATEMENT: Patient arrives to PT with husband, using RW. "The little muscles aren't doing their job." Uses the RW primarily in the community.  Pt accompanied by: significant other  PERTINENT HISTORY: none on file  PAIN:  Are you  having pain? No  PRECAUTIONS: Fall   WEIGHT BEARING RESTRICTIONS: No  FALLS: Has patient fallen in last 6 months? No some near falls when walking in her house, using furniture to regain balance  LIVING ENVIRONMENT: Lives with: lives with their family Lives in: House/apartment Stairs: Yes: Internal: flight steps; on right going up and External: 4 steps; bilateral but cannot reach both Has following equipment at home: Dan Humphreys - 2 wheeled, shower chair, and Grab bars  PLOF: Requires assistive device for independence  PATIENT GOALS: "my left leg needs work"  OBJECTIVE:  Note: Objective measures were completed  at Evaluation unless otherwise noted.  DIAGNOSTIC FINDINGS: 08/04/23 Brain MRI IMPRESSION: 1. 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 Associated mild petechial blood products without frank hemorrhagic transformation or significant regional mass effect. 2. Underlying moderately advanced chronic microvascular ischemic disease.  COGNITION: Overall cognitive status: Within functional limits for tasks assessed   SENSATION: Intermittent areas of numbness in L hemibody  COORDINATION: L LE dysmetric and ataxic heel/shin and figure 8   POSTURE: No Significant postural limitations  LOWER EXTREMITY MMT:    MMT Right Eval Left Eval  Hip flexion 5 4  Hip extension    Hip abduction 5 4  Hip adduction 5 4  Hip internal rotation    Hip external rotation    Knee flexion 5 4  Knee extension 5 4  Ankle dorsiflexion 5 4  Ankle plantarflexion 5 4  Ankle inversion    Ankle eversion    (Blank rows = not tested)  BED MOBILITY:  Independent   TRANSFERS: Assistive device utilized: None  Sit to stand: SBA Stand to sit: SBA Chair to chair: SBA  STAIRS: Level of Assistance: SBA Stair Negotiation Technique: Step to Pattern with Single Rail on Right Number of Stairs: 4  Height of Stairs: 6   GAIT: Gait pattern: step through pattern, decreased step length-  Left, decreased stance time- Left, decreased hip/knee flexion- Left, circumduction- Left, Left steppage, Left foot flat, ataxic, wide BOS, and poor foot clearance- Left Distance walked: clinic Assistive device utilized: Environmental consultant - 2 wheeled and None Level of assistance: Modified independence and SBA   FUNCTIONAL TESTS:   [facility] PT Assessment - 09/03/23 0001       Standardized Balance Assessment   Standardized Balance Assessment Timed Up and Go Test    Five times sit to stand comments  11.6 no UE    10 Meter Walk .44m/s      Timed Up and Go Test   Normal TUG (seconds) 17.5      Functional Gait  Assessment   Gait assessed  Yes    Gait Level Surface Walks 20 ft, slow speed, abnormal gait pattern, evidence for imbalance or deviates 10-15 in outside of the 12 in walkway width. Requires more than 7 sec to ambulate 20 ft.    Change in Gait Speed Makes only minor adjustments to walking speed, or accomplishes a change in speed with significant gait deviations, deviates 10-15 in outside the 12 in walkway width, or changes speed but loses balance but is able to recover and continue walking.    Gait with Horizontal Head Turns Performs head turns with moderate changes in gait velocity, slows down, deviates 10-15 in outside 12 in walkway width but recovers, can continue to walk.    Gait with Vertical Head Turns Performs task with severe disruption of gait (eg, staggers 15 in outside 12 in walkway width, loses balance, stops, reaches for wall).    Gait and Pivot Turn Cannot turn safely, requires assistance to turn and stop.    Step Over Obstacle Cannot perform without assistance.    Gait with Narrow Base of Support Ambulates less than 4 steps heel to toe or cannot perform without assistance.    Gait with Eyes Closed Cannot walk 20 ft without assistance, severe gait deviations or imbalance, deviates greater than 15 in outside 12 in walkway width or will not attempt task.    Ambulating Backwards Cannot walk  20 ft without assistance, severe gait deviations or imbalance, deviates greater than 15 in outside  12 in walkway width or will not attempt task.    Steps Two feet to a stair, must use rail.    Total Score 3             TODAY'S TREATMENT:                                                                                                                              N/a eval   PATIENT EDUCATION: Education details: PT POC, exam findings Person educated: Patient and Spouse Education method: Explanation Education comprehension: verbalized understanding  HOME EXERCISE PROGRAM: To be provided  GOALS: Goals reviewed with patient? Yes  SHORT TERM GOALS: Target date: 10/02/23  Pt will be independent with initial HEP for improved functional strength and balance  Baseline: to be provided Goal status: INITIAL  2.  Pt will improve TUG to </= 15 secs to demonstrated reduced fall risk  Baseline: 17.5s no AD Goal status: INITIAL  3.  Pt will improve 5x STS to </= 10 sec to demo improved functional LE strength and balance   Baseline: 11.6s no UE Goal status: INITIAL  4.  Pt will improve gait speed to >/= .6m/s m/s to demonstrate improved community ambulation  Baseline: .5m/s Goal status: INITIAL  5.  Pt will improve FGA to >/= 8/30 to demonstrate improved balance and reduced fall risk Baseline: 3/30 Goal status: INITIAL   LONG TERM GOALS: Target date: 10/30/23  Pt will be independent with initial HEP for improved functional strength and balance  Baseline: to be provided Goal status: INITIAL  Pt will improve TUG to </= 12 secs to demonstrated reduced fall risk  Baseline: 17.5s no AD Goal status: INITIAL   Pt will improve gait speed to >/= .6 m/s to demonstrate improved community ambulation  Baseline: .35m/s Goal status: INITIAL  Pt will improve FGA to >/= 13/30 to demonstrate improved balance and reduced fall risk Baseline: 3 Goal status:  INITIAL  ASSESSMENT:  CLINICAL IMPRESSION: Patient is a 72 y.o. female who was seen today for physical therapy evaluation and treatment for mobility deficits s/p CVA. She presents with an ataxic and nearly spastic gait pattern in L LE.  Patient completed the Timed Up and Go test (TUG) in 17.5 seconds.  Geriatrics: need for further assessment of fall risk: >/= 12 sec; Recurrent falls: > 15 sec; Vestibular Disorders fall risk: > 15 sec; Parkinson's Disease fall risk: > 16 sec ([email or web address], 2023). 10 Meter Walk Test: Patient instructed to walk 10 meters (32.8 ft) as quickly and as safely as possible at their normal speed x2 and at a fast speed x2. Time measured from 2 meter mark to 8 meter mark to accommodate ramp-up and ramp-down.  Normal speed: .2m/s Cut off scores: <0.4 m/s = household Ambulator, 0.4-0.8 m/s = limited community Ambulator, >0.8 m/s = community Ambulator, >1.2 m/s = crossing a street, <1.0 = increased fall risk MCID 0.05 m/s (small), 0.13 m/s (moderate), 0.06  m/s (significant)  (ANPTA Core Set of Outcome Measures for Adults with Neurologic Conditions, 2018). Five times Sit to Stand Test (FTSS) Method: Use a straight back chair with a solid seat that is 17-18" high. Ask participant to sit on the chair with arms folded across their chest.   Instructions: "Stand up and sit down as quickly as possible 5 times, keeping your arms folded across your chest."   Measurement: Stop timing when the participant touches the chair in sitting the 5th time.  TIME: 11.6 sec  Cut off scores indicative of increased fall risk: >12 sec CVA, >16 sec PD, >13 sec vestibular (ANPTA Core Set of Outcome Measures for Adults with Neurologic Conditions, 2018). Patient demonstrates increased fall risk as noted by score of 3/30 on  Functional Gait Assessment.   <22/30 = predictive of falls, <20/30 = fall in 6 months, <18/30 = predictive of falls in PD MCID: 5 points stroke population, 4 points geriatric  population (ANPTA Core Set of Outcome Measures for Adults with Neurologic Conditions, 2018). She would benefit from skilled PT services to address the above mentioned deficits.    OBJECTIVE IMPAIRMENTS: Abnormal gait, decreased balance, decreased coordination, decreased endurance, decreased knowledge of condition, decreased mobility, difficulty walking, decreased strength, impaired sensation, impaired tone, and impaired UE functional use.   ACTIVITY LIMITATIONS: carrying, lifting, squatting, bed mobility, reach over head, hygiene/grooming, locomotion level, and caring for others  PARTICIPATION LIMITATIONS: meal prep, cleaning, interpersonal relationship, driving, shopping, and community activity  PERSONAL FACTORS: Age, Time since onset of injury/illness/exacerbation, and Transportation are also affecting patient's functional outcome.   REHAB POTENTIAL: Good  CLINICAL DECISION MAKING: Stable/uncomplicated  EVALUATION COMPLEXITY: Low  PLAN:  PT FREQUENCY: 2x/week  PT DURATION: 6 weeks  PLANNED INTERVENTIONS: 97164- PT Re-evaluation, 97110-Therapeutic exercises, 97530- Therapeutic activity, 97112- Neuromuscular re-education, 97535- Self Care, 78469- Manual therapy, [phone]- Gait training, [phone]- Orthotic Fit/training, [phone]- Canalith repositioning, [phone]- Aquatic Therapy, Patient/Family education, Balance training, Stair training, Dry Needling, Vestibular training, Visual/preceptual remediation/compensation, and DME instructions  PLAN FOR NEXT SESSION: HEP, L LE coordination   Westley Foots, PT Westley Foots, PT, DPT, CBIS  09/03/2023, 12:47 PM

## 2023-09-04 DIAGNOSIS — Z8709 Personal history of other diseases of the respiratory system: Secondary | ICD-10-CM | POA: Diagnosis not present

## 2023-09-04 DIAGNOSIS — I639 Cerebral infarction, unspecified: Secondary | ICD-10-CM

## 2023-09-04 DIAGNOSIS — E0590 Thyrotoxicosis, unspecified without thyrotoxic crisis or storm: Secondary | ICD-10-CM | POA: Diagnosis not present

## 2023-09-04 DIAGNOSIS — E7800 Pure hypercholesterolemia, unspecified: Secondary | ICD-10-CM | POA: Diagnosis not present

## 2023-09-04 DIAGNOSIS — F325 Major depressive disorder, single episode, in full remission: Secondary | ICD-10-CM | POA: Diagnosis not present

## 2023-09-04 DIAGNOSIS — R7303 Prediabetes: Secondary | ICD-10-CM | POA: Diagnosis not present

## 2023-09-04 DIAGNOSIS — Z6827 Body mass index (BMI) 27.0-27.9, adult: Secondary | ICD-10-CM | POA: Diagnosis not present

## 2023-09-07 ENCOUNTER — Ambulatory Visit: Payer: PPO | Admitting: Physical Therapy

## 2023-09-07 ENCOUNTER — Ambulatory Visit: Payer: PPO | Admitting: Occupational Therapy

## 2023-09-07 VITALS — BP 136/61 | HR 53

## 2023-09-07 DIAGNOSIS — M6281 Muscle weakness (generalized): Secondary | ICD-10-CM

## 2023-09-07 DIAGNOSIS — R278 Other lack of coordination: Secondary | ICD-10-CM

## 2023-09-07 DIAGNOSIS — R29898 Other symptoms and signs involving the musculoskeletal system: Secondary | ICD-10-CM

## 2023-09-07 DIAGNOSIS — R29818 Other symptoms and signs involving the nervous system: Secondary | ICD-10-CM

## 2023-09-07 DIAGNOSIS — R208 Other disturbances of skin sensation: Secondary | ICD-10-CM

## 2023-09-07 DIAGNOSIS — R2681 Unsteadiness on feet: Secondary | ICD-10-CM

## 2023-09-07 NOTE — Therapy (Signed)
OUTPATIENT PHYSICAL THERAPY NEURO TREATMENT   Patient Name: Maleyna Mceuen MRN: [id number] DOB:10-Jun-1951, 72 y.o., female Today's Date: 09/07/2023   PCP: Sigmund Hazel, MD REFERRING PROVIDER: Mariam Dollar, PA-C  END OF SESSION:  PT End of Session - 09/07/23 1405     Visit Number 2    Number of Visits 13    Date for PT Re-Evaluation 10/30/23    Authorization Type HTA    PT Start Time 1403    PT Stop Time 1447    PT Time Calculation (min) 44 min    Equipment Utilized During Treatment --    Activity Tolerance Patient tolerated treatment well    Behavior During Therapy [facility] for tasks assessed/performed             No past medical history on file. Past Surgical History:  Procedure Laterality Date   BREAST EXCISIONAL BIOPSY Left    benign   Patient Active Problem List   Diagnosis Date Noted   Essential hypertension 08/13/2023   Alteration of sensations, post-stroke 08/13/2023   Acute CVA (cerebrovascular accident) (HCC) 08/10/2023   TIA (transient ischemic attack) 08/03/2023   Stroke determined by clinical assessment (HCC) 08/03/2023    ONSET DATE: 08/14/23 referral  REFERRING DIAG: I63.9 (ICD-10-CM) - Cerebral infarction, unspecified   THERAPY DIAG:  Other lack of coordination  Muscle weakness (generalized)  Unsteadiness on feet  Rationale for Evaluation and Treatment: Rehabilitation  SUBJECTIVE:                                                                                                                                                                                             SUBJECTIVE STATEMENT: Pt presents w/RW, handoff w/OT. Reports she was able to walk without her RW the other day for a short distance. Wearing a heart monitor for 4 weeks to determine if there was a cardiac cause for her stroke.   Pt accompanied by: significant other, [name]  PERTINENT HISTORY: none on file  PAIN:  Are you having pain? No  PRECAUTIONS: Fall   WEIGHT BEARING  RESTRICTIONS: No  FALLS: Has patient fallen in last 6 months? No some near falls when walking in her house, using furniture to regain balance  LIVING ENVIRONMENT: Lives with: lives with their family Lives in: House/apartment Stairs: Yes: Internal: flight steps; on right going up and External: 4 steps; bilateral but cannot reach both Has following equipment at home: Walker - 2 wheeled, shower chair, and Grab bars  PLOF: Requires assistive device for independence  PATIENT GOALS: "my left leg needs work"  OBJECTIVE:  Note: Objective measures were completed at Evaluation unless otherwise  noted.  DIAGNOSTIC FINDINGS: 08/04/23 Brain MRI IMPRESSION: 1. 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 Associated mild petechial blood products without frank hemorrhagic transformation or significant regional mass effect. 2. Underlying moderately advanced chronic microvascular ischemic disease.  COGNITION: Overall cognitive status: Within functional limits for tasks assessed   SENSATION: Intermittent areas of numbness in L hemibody  COORDINATION: L LE dysmetric and ataxic heel/shin and figure 8   POSTURE: No Significant postural limitations  LOWER EXTREMITY MMT:    MMT Right Eval Left Eval  Hip flexion 5 4  Hip extension    Hip abduction 5 4  Hip adduction 5 4  Hip internal rotation    Hip external rotation    Knee flexion 5 4  Knee extension 5 4  Ankle dorsiflexion 5 4  Ankle plantarflexion 5 4  Ankle inversion    Ankle eversion    (Blank rows = not tested)  BED MOBILITY:  Independent   TRANSFERS: Assistive device utilized: None  Sit to stand: SBA Stand to sit: SBA Chair to chair: SBA  STAIRS: Level of Assistance: SBA Stair Negotiation Technique: Step to Pattern with Single Rail on Right Number of Stairs: 4  Height of Stairs: 6   GAIT: Gait pattern: step through pattern, decreased step length- Left, decreased stance time- Left, decreased hip/knee  flexion- Left, circumduction- Left, Left steppage, Left foot flat, ataxic, wide BOS, and poor foot clearance- Left Distance walked: clinic Assistive device utilized: Walker - 2 wheeled and None Level of assistance: Modified independence and SBA  VITALS  Vitals:   09/07/23 1415  BP: 136/61  Pulse: (!) 53     TODAY'S TREATMENT:   Ther Act  Assessed vitals (see above) and BP WNL but pt's HR bradycardic. Discussed normal BP parameters and effect of beta blocker on HR. Pt reports she had a "painful pulse" of her R wrist a few times prior to having the stroke. States she has not told her doctor this as she did not remember until now. Encouraged pt to speak to MD about this (has a follow up on 11/15) as this is not something commonly said about a pulse. Pt and husband verbalized understanding.   Ther Ex  Established initial HEP for improved functional core, quad and hip strength. Pt declined handout of exercises:  Seated march overs usin 12# KB, x15 reps per side. Seated resisted LAQ w/red theraband around ankles, x15 reps on LLE. Pt initially ataxic w/movement but was quickly able to perform controlled, smooth knee extension and flexion w/use of band.     PATIENT EDUCATION: Education details: See ther act, initial HEP  Person educated: Patient and Spouse Education method: Explanation, Demonstration, and Tactile cues Education comprehension: verbalized understanding and returned demonstration  HOME EXERCISE PROGRAM: Verbally added banded LAQ and seated march overs on 10/28 (pt declined handout)   GOALS: Goals reviewed with patient? Yes  SHORT TERM GOALS: Target date: 10/02/23  Pt will be independent with initial HEP for improved functional strength and balance  Baseline: to be provided Goal status: INITIAL  2.  Pt will improve TUG to </= 15 secs to demonstrated reduced fall risk  Baseline: 17.5s no AD Goal status: INITIAL  3.  Pt will improve 5x STS to </= 10 sec to demo  improved functional LE strength and balance   Baseline: 11.6s no UE Goal status: INITIAL  4.  Pt will improve gait speed to >/= .4m/s m/s to demonstrate improved community ambulation  Baseline: .65m/s Goal status: INITIAL  5.  Pt will improve FGA to >/= 8/30 to demonstrate improved balance and reduced fall risk Baseline: 3/30 Goal status: INITIAL   LONG TERM GOALS: Target date: 10/30/23  Pt will be independent with initial HEP for improved functional strength and balance  Baseline: to be provided Goal status: INITIAL  Pt will improve TUG to </= 12 secs to demonstrated reduced fall risk  Baseline: 17.5s no AD Goal status: INITIAL   Pt will improve gait speed to >/= .6 m/s to demonstrate improved community ambulation  Baseline: .61m/s Goal status: INITIAL  Pt will improve FGA to >/= 13/30 to demonstrate improved balance and reduced fall risk Baseline: 3 Goal status: INITIAL  ASSESSMENT:  CLINICAL IMPRESSION: Emphasis of skilled PT session on pt education and establishing initial HEP for improved LLE strength and coordination. Pt reports she is wearing a heart monitor for 4 weeks to rule out A-fib or any other cardiac abnormalities that could have caused her stroke. Provided education regarding effect of beta blocker on HR and proper BP parameters. Pt reports she had bounding pain in her R wrist a few times prior to her CVA in which she felt like her pulse was very strong. States she has not experienced this since her stroke, but encouraged pt to speak to MD about this. 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 Continue POC.    OBJECTIVE IMPAIRMENTS: Abnormal gait, decreased balance, decreased coordination, decreased endurance, decreased knowledge of condition, decreased mobility, difficulty walking, decreased strength, impaired sensation, impaired tone, and impaired UE functional use.    ACTIVITY LIMITATIONS: carrying, lifting, squatting, bed mobility, reach over head, hygiene/grooming, locomotion level, and caring for others  PARTICIPATION LIMITATIONS: meal prep, cleaning, interpersonal relationship, driving, shopping, and community activity  PERSONAL FACTORS: Age, Time since onset of injury/illness/exacerbation, and Transportation are also affecting patient's functional outcome.   REHAB POTENTIAL: Good  CLINICAL DECISION MAKING: Stable/uncomplicated  EVALUATION COMPLEXITY: Low  PLAN:  PT FREQUENCY: 2x/week  PT DURATION: 6 weeks  PLANNED INTERVENTIONS: 97164- PT Re-evaluation, 97110-Therapeutic exercises, 97530- Therapeutic activity, 97112- Neuromuscular re-education, 97535- Self Care, [id number]- Manual therapy, [phone]- Gait training, [phone]- Orthotic Fit/training, [phone]- Canalith repositioning, [phone]- Aquatic Therapy, Patient/Family education, Balance training, Stair training, Dry Needling, Vestibular training, Visual/preceptual remediation/compensation, and DME instructions  PLAN FOR NEXT SESSION: Monitor vitals. Add to HEP, L LE coordination, try weights on L ankle    Jill Alexanders Nakeem Murnane, PT, DPT  09/07/2023, 2:53 PM

## 2023-09-07 NOTE — Therapy (Signed)
OUTPATIENT OCCUPATIONAL THERAPY Treatment  Patient Name: Priscilla Wilson MRN: [id number] DOB:07/11/1951, 72 y.o., female Today's Date: 09/07/2023  PCP: Priscilla Hazel, MD  REFERRING PROVIDER: Charlton Amor, PA-C   END OF SESSION:  OT End of Session - 09/07/23 1310     Visit Number 3    Number of Visits 12    Date for OT Re-Evaluation 10/23/23    Authorization Type [employer] - follows Medicare guidelines    Progress Note Due on Visit 10    OT Start Time 1315    OT Stop Time 1400    OT Time Calculation (min) 45 min    Activity Tolerance Patient tolerated treatment well    Behavior During Therapy [facility] for tasks assessed/performed              No past medical history on file. Past Surgical History:  Procedure Laterality Date   BREAST EXCISIONAL BIOPSY Left    benign   Patient Active Problem List   Diagnosis Date Noted   Essential hypertension 08/13/2023   Alteration of sensations, post-stroke 08/13/2023   Acute CVA (cerebrovascular accident) (HCC) 08/10/2023   TIA (transient ischemic attack) 08/03/2023   Stroke determined by clinical assessment (HCC) 08/03/2023    ONSET DATE: 08/14/23 (referral date), 08/03/23 (date of CVA)  REFERRING DIAG: I63.9 (ICD-10-CM) - Cerebral infarction, unspecified   THERAPY DIAG:  Other lack of coordination  Muscle weakness (generalized)  Other disturbances of skin sensation  Other symptoms and signs involving the musculoskeletal system  Other symptoms and signs involving the nervous system  Rationale for Evaluation and Treatment: Rehabilitation  SUBJECTIVE:   SUBJECTIVE STATEMENT: Pt reports feeling "worn down" today. Pt reports noticing "swaying" of R arm when reaching distally during shoulder ABD HEP. Pt reports baking task went well but "I didn't do as much as I intended." Pt reports using stove and oven "without too much problem."  Pt accompanied by: self and significant other (husband: Priscilla Wilson)  PERTINENT  HISTORY: Acute CVA, Essential HTN, alteration of sensation post-stroke, hyperthyroidism, asthma, hyperlipidemia, GERD, pre-diabetes  Per 08/21/23 D/C Summary: "Priscilla Wilson is a 72 y.o. female presented to the [facility] ED on 08/03/2023 with sudden onset of left-sided numbness and weakness... 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 2D echo LVEF 70-75%. Hgb A1C 6.3%."   PRECAUTIONS: None Pt reported "they say I'm not allowed to drive" since stroke.  WEIGHT BEARING RESTRICTIONS: No  PAIN:  Are you having pain? No  FALLS: Has patient fallen in last 6 months? No  LIVING ENVIRONMENT: Lives with: lives with their spouse Lives in: House/apartment Stairs: Yes: Internal: 16 steps; on left going up and External: 3 steps; bilateral but cannot reach both - Pt reported not needing to go upstairs during the day because all appliances and room accessible at home. Has following equipment at home: Dan Humphreys - 2 wheeled, shower chair, and Grab bars  PLOF: Independent, pt driving, pt retired  PATIENT GOALS: Pt reported "I don't have all my feeling back in L arm, but it's come a long way." "I want to be able to do all the things I did before." Pt reports wanting to get back to line dancing and driving, to "get rid of walker," and to "make Christmas cookies.  OBJECTIVE:  Note: Objective measures were completed at Evaluation unless otherwise noted.  HAND DOMINANCE: Right  ADLs: Overall ADLs: ind, sometimes with  supervision for safety Transfers/ambulation related to ADLs: ind with supervision Eating: ind Grooming: ind UB Dressing: ind, no difficulty with hooking bra, manipulating buttons, zippers LB Dressing: ind Toileting: ind with grab bar next to toilet Bathing: ind Tub Shower transfers: no difficulty; with supervision  Equipment: Walk in shower  and walk-in tub  IADLs: Shopping: dependent Light housekeeping: have not attempted some tasks (e.g. laundry, dishwashing), ind wiping table and pt reports she is "pretty sure I can do [employer], putting dishes in dishwasher]". Meal Prep: currently dependent because husband wants to complete tasks. Pt reports she may be able to complete cutting tasks. Pt reports difficulty carrying heavy objects, such as pan or tray.  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 Pt's spouse reports pt has some unsteadiness "from time-to-time" though reports it has improved since stroke. Pt reports some difficulty with balance when moving during some tasks (e.g. attempting to stand on one foot for LB dressing while leaning against bed). Pt generally uses the walker during the day.  POSTURE COMMENTS:  Ind, no difficulty , supports self   ACTIVITY TOLERANCE: Activity tolerance: Pt reported increased fatigue during the day, such as from re-learning tasks since the stroke, walking far distances, and completing hand exercises.  FUNCTIONAL OUTCOME MEASURES: FOTO: 55    UPPER EXTREMITY ROM:    Active ROM Right eval Left eval  Shoulder flexion [facility]  Shoulder abduction    Shoulder adduction    Shoulder extension    Shoulder internal rotation    Shoulder external rotation    Elbow flexion    Elbow extension    Wrist flexion    Wrist extension    Wrist ulnar deviation    Wrist radial deviation    Wrist pronation    Wrist supination    (Blank rows = not tested)  Note: OT noted slightly slower movements of LUE compared to RUE.  UPPER EXTREMITY MMT:     MMT Right eval Left eval  Shoulder flexion 4 4-  Shoulder abduction 4 4-  Shoulder adduction    Shoulder extension    Shoulder internal rotation    Shoulder external rotation     Middle trapezius    Lower trapezius    Elbow flexion    Elbow extension    Wrist flexion    Wrist extension    Wrist ulnar deviation    Wrist radial deviation    Wrist pronation    Wrist supination    (Blank rows = not tested)  HAND FUNCTION: Grip strength: Right: 34.9 lbs; Left: 43.8 lbs - average of 3 trials  OT noted increased shaking of LUE compared to RUE when pt grasped dynamometer with LUE.  COORDINATION: 9 Hole Peg test: Right: 18 sec; Left: 64 sec  Several drops with LUE, shaking of LUE.  SENSATION: Pt reports tips of fingers have difficulty with grading of force: e.g. "I can't tell how tight I'm holding something."   Pt reports impaired sensation of LUE "Like there is a veil over my left arm." Pt reports numbness of L axilla. Pt reports "tightness" of L bicep.  EDEMA: None noted  MUSCLE TONE: none noted  COGNITION: Overall cognitive status: Within functional limits for tasks assessed  VISION: Subjective report: Pt reports no changes in vision. Pt correctly read clock on wall. Baseline vision: Wears glasses all the time, Wears glasses for distance and reading.  Visual history: cataracts - pt reports doctor says cataracts are "starting" though no treatment yet.  VISION ASSESSMENT: Not tested Appeared [facility] when completing other assessment tasks   Patient has difficulty with following activities due to following visual impairments: none noted.  PERCEPTION: Not tested  PRAXIS: Not tested  OBSERVATIONS: Pt accompanied by husband. Pt appeared well-kept. Pt ambulated with 2-wheeled rolling walker with slow altered gait.   TODAY'S TREATMENT:                                                                                                                              DATE:   Neuro Re-Ed: OT educated on proximal stability for distal mobility. Pt verbalized understanding.  OT reviewed shoulder HEP. OT recommended increasing shoulder HEP to 2 to 3x per day to  increase strengthening. Pt verbalized understanding.   OT initiated coordination HEP (handout provided, see pt instructions) - to increase LUE FM coordination, dexterity, proprioception. OT educated pt on purpose of each activity. Pt verbalized and demo'd understanding.   -Picking up coins with LUE, reaching across midline - to increase shoulder AROM and strengthening for functional reach, to improve LUE coordination - open container x15 pennies then closed container with slot for x15 pennies.  -Stacking towers of 5 coins with LUE at midline - to improve LUE dexterity, coordination - 5 towers of 5 coins each.  -Shuffling cards - to improve Bilateral integration, LUE coordination, LUE grading of force - x5 reps.  -Flipping cards - to improve LUE coordination and dexterity - flipping card from bottom over top, flipping card over side with L palm down, then flipping card over side starting with L palm down then moving to L palm up (pronation to supination).  -Tearing pieces of paper to roll small balls using tissue and paper- to improve LUE coordination and dexterity.  -Meditation balls - timed 2 minutes clockwise then counterclockwise (1 min each) - to improve LUE coordination and dexterity - x10 drops. D/t difficulty with number of drops, OT provided alternate option of spinning a medium-size ball in fingertips - timed 2 minutes - clockwise and counterclockwise. Handout updated.  -Pen roll, pen translation - to improve LUE coordination and dexterity.   PATIENT EDUCATION: Education details: See today's treatment Person educated: Patient and Spouse Education method: Explanation, Demonstration, Tactile cues, Verbal cues, and Handouts Education comprehension: verbalized understanding, returned demonstration, and needs further education  HOME EXERCISE PROGRAM: 09/03/23 - Shoulder and theraputty. Access Code: [email or web address] 09/07/23 - Coordination HEP (handout, see pt instructions)   GOALS: Goals  reviewed with patient? Yes  SHORT TERM GOALS: Target date: 09/25/23  Patient will demonstrate improved FM coordination by completing nine-hole peg with use of LUE in 53 seconds or less with no more than 3 drops. Baseline: Left: 64 sec, Several drops with LUE Goal status: INITIAL  2.  Pt will demonstrate independence with initial HEP for coordination and theraputty of LUE. Baseline: New to outpt OT Goal  status: INITIAL  3. Patient will demonstrate at least 5 lbs increase of LUE grip strength (average 3 trials) and stable LUE grasp as needed to open jars and other containers.  Baseline: Left: 43.8 lbs, shaking of LUE when grasping dynamometer  Goal status: INITIAL  4. Pt will report little difficulty picking a coin up from the table using LUE.  Baseline: Per FOTO, pt picks up coins from table "with much difficulty" Goal status: INITIAL   LONG TERM GOALS: Target date: 10/23/23  Patient will demonstrate improved FM coordination by completing nine-hole peg with use of LUE in 40 seconds or less with no more than 1 drop. Baseline: Left: 64 sec, Several drops with LUE Goal status: INITIAL  2.  Pt will demonstrate independence with updated HEP PRN. Baseline: New to outpt OT Goal status: INITIAL  3.  Pt will participate in cooking/baking task safely at mod I level. Baseline: pt has not attempted cooking/baking tasks since stroke Goal status: INITIAL  4.  Pt will report no more than mild difficulty with carrying an item in her left, affected hand to improve participation in light household tasks (e.g. dishwashing, laundry). Baseline: pt reports difficulty carrying heavy items Goal status: INITIAL  5.  Pt will complete D/C FOTO Baseline: Intake FOTO completed at eval Goal status: INITIAL    ASSESSMENT:  CLINICAL IMPRESSION: Pt tolerated OT session tasks well today. Pt benefited from extra time to complete tasks d/t decreased LUE coordination. Pt would benefit from skilled OT  services in the outpatient setting to work on impairments as noted below to help pt return to PLOF as able.     PERFORMANCE DEFICITS: in functional skills including ADLs, IADLs, coordination, dexterity, proprioception, sensation, ROM, strength, Fine motor control, Gross motor control, balance, body mechanics, endurance, and UE functional use, cognitive skills including energy/drive, and psychosocial skills including environmental adaptation.   IMPAIRMENTS: are limiting patient from ADLs, IADLs, leisure, and social participation.   CO-MORBIDITIES: may have co-morbidities  that affects occupational performance. Patient will benefit from skilled OT to address above impairments and improve overall function.  MODIFICATION OR ASSISTANCE TO COMPLETE EVALUATION: No modification of tasks or assist necessary to complete an evaluation.  OT OCCUPATIONAL PROFILE AND HISTORY: Problem focused assessment: Including review of records relating to presenting problem.  CLINICAL DECISION MAKING: Moderate - several treatment options, min-mod task modification necessary  REHAB POTENTIAL: Good  EVALUATION COMPLEXITY: Low    PLAN:  OT FREQUENCY: 1-2x/week  (2x per week initially, progress to 1x per week PRN if pt makes good progress)  OT DURATION: 6 weeks (POC extended 2 weeks to allow for scheduling)  PLANNED INTERVENTIONS: 97168 OT Re-evaluation, 97535 self care/ADL training, 16109 therapeutic exercise, 97530 therapeutic activity, 97112 neuromuscular re-education, 97140 manual therapy, 97035 ultrasound, 97018 paraffin, 60454 fluidotherapy, 97010 moist heat, 97010 cryotherapy, 97032 electrical stimulation (manual), 97760 Orthotics management and training, 09811 Splinting (initial encounter), [id number] Subsequent splinting/medication, manual lymph drainage, passive range of motion, functional mobility training, visual/perceptual remediation/compensation, energy conservation, patient/family education, and DME and/or AE  instructions  RECOMMENDED OTHER SERVICES: PT eval re-scheduled  CONSULTED AND AGREED WITH PLAN OF CARE: Patient and family member/caregiver  PLAN FOR NEXT SESSION:   Practice ADL/IADL tasks with standing tolerance, dynamic balance, environmental scanning, multi-tasking - e.g. dishwashing, laundry  Progress B shoulder HEP for strengthening/stability  [employer]?  Coordination tasks   Wynetta Emery, OT 09/07/2023, 4:42 PM

## 2023-09-10 ENCOUNTER — Ambulatory Visit: Payer: PPO | Admitting: Occupational Therapy

## 2023-09-10 ENCOUNTER — Ambulatory Visit: Payer: PPO

## 2023-09-10 DIAGNOSIS — R29818 Other symptoms and signs involving the nervous system: Secondary | ICD-10-CM

## 2023-09-10 DIAGNOSIS — R278 Other lack of coordination: Secondary | ICD-10-CM | POA: Diagnosis not present

## 2023-09-10 DIAGNOSIS — M6281 Muscle weakness (generalized): Secondary | ICD-10-CM

## 2023-09-10 DIAGNOSIS — R208 Other disturbances of skin sensation: Secondary | ICD-10-CM

## 2023-09-10 DIAGNOSIS — R29898 Other symptoms and signs involving the musculoskeletal system: Secondary | ICD-10-CM

## 2023-09-10 DIAGNOSIS — R2681 Unsteadiness on feet: Secondary | ICD-10-CM

## 2023-09-10 DIAGNOSIS — R1310 Dysphagia, unspecified: Secondary | ICD-10-CM

## 2023-09-10 DIAGNOSIS — I639 Cerebral infarction, unspecified: Secondary | ICD-10-CM

## 2023-09-10 NOTE — Therapy (Signed)
OUTPATIENT OCCUPATIONAL THERAPY Treatment  Patient Name: Priscilla Wilson MRN: [id number] DOB:1951/01/03, 72 y.o., female Today's Date: 09/10/2023  PCP: Sigmund Hazel, MD  REFERRING PROVIDER: Charlton Amor, PA-C   END OF SESSION:  OT End of Session - 09/10/23 1615     Visit Number 4    Number of Visits 12    Date for OT Re-Evaluation 10/23/23    Authorization Type [employer] - follows Medicare guidelines    Progress Note Due on Visit 10    OT Start Time 1530    OT Stop Time 1614    OT Time Calculation (min) 44 min    Activity Tolerance Patient tolerated treatment well    Behavior During Therapy [facility] for tasks assessed/performed               No past medical history on file. Past Surgical History:  Procedure Laterality Date   BREAST EXCISIONAL BIOPSY Left    benign   Patient Active Problem List   Diagnosis Date Noted   Essential hypertension 08/13/2023   Alteration of sensations, post-stroke 08/13/2023   Acute CVA (cerebrovascular accident) (HCC) 08/10/2023   TIA (transient ischemic attack) 08/03/2023   Stroke determined by clinical assessment (HCC) 08/03/2023    ONSET DATE: 08/14/23 (referral date), 08/03/23 (date of CVA)  REFERRING DIAG: I63.9 (ICD-10-CM) - Cerebral infarction, unspecified   THERAPY DIAG:  Other lack of coordination  Muscle weakness (generalized)  Other disturbances of skin sensation  Other symptoms and signs involving the musculoskeletal system  Other symptoms and signs involving the nervous system  Rationale for Evaluation and Treatment: Rehabilitation  SUBJECTIVE:   SUBJECTIVE STATEMENT: Pt reports L fingers feel "better" and improved ability to pick up small objects. Pt reports completing rotation coordination exercise with golf balls. Pt reports improved ability for "how much I need to grip to hold onto [objects]" (grading of force). Pt demo'd improved ability to snap fingers today and reported sensation feels the  same on both arms except for tips of L fingertips. Pt reports decreased number of drops throughout the day. Pt reported some "soreness" of L shoulder after increasing reps/sets of shoulder stretch HEP.  Pt accompanied by: self and significant other (husband: Priscilla Wilson)  PERTINENT HISTORY: Acute CVA, Essential HTN, alteration of sensation post-stroke, hyperthyroidism, asthma, hyperlipidemia, GERD, pre-diabetes  Per 08/21/23 D/C Summary: "Priscilla Wilson is a 72 y.o. female presented to the [facility] ED on 08/03/2023 with sudden onset of left-sided numbness and weakness... 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 2D echo LVEF 70-75%. Hgb A1C 6.3%."   PRECAUTIONS: None Pt reported "they say I'm not allowed to drive" since stroke.  WEIGHT BEARING RESTRICTIONS: No  PAIN:  Are you having pain? Pt reports 1/10 pain, chronic arthritis pain, which improves at rest.  FALLS: Has patient fallen in last 6 months? No  LIVING ENVIRONMENT: Lives with: lives with their spouse Lives in: House/apartment Stairs: Yes: Internal: 16 steps; on left going up and External: 3 steps; bilateral but cannot reach both - Pt reported not needing to go upstairs during the day because all appliances and room accessible at home. Has following equipment at home: Dan Humphreys - 2 wheeled, shower chair, and Grab bars  PLOF: Independent, pt driving, pt retired  PATIENT GOALS: Pt reported "I don't have all my feeling back in L arm, but it's come a long way." "I want to be able  to do all the things I did before." Pt reports wanting to get back to line dancing and driving, to "get rid of walker," and to "make Christmas cookies.  OBJECTIVE:  Note: Objective measures were completed at Evaluation unless otherwise noted.  HAND DOMINANCE: Right  ADLs: Overall ADLs: ind, sometimes with  supervision for safety Transfers/ambulation related to ADLs: ind with supervision Eating: ind Grooming: ind UB Dressing: ind, no difficulty with hooking bra, manipulating buttons, zippers LB Dressing: ind Toileting: ind with grab bar next to toilet Bathing: ind Tub Shower transfers: no difficulty; with supervision  Equipment: Walk in shower and walk-in tub  IADLs: Shopping: dependent Light housekeeping: have not attempted some tasks (e.g. laundry, dishwashing), ind wiping table and pt reports she is "pretty sure I can do [employer], putting dishes in dishwasher]". Meal Prep: currently dependent because husband wants to complete tasks. Pt reports she may be able to complete cutting tasks. Pt reports difficulty carrying heavy objects, such as pan or tray.  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 Pt's spouse reports pt has some unsteadiness "from time-to-time" though reports it has improved since stroke. Pt reports some difficulty with balance when moving during some tasks (e.g. attempting to stand on one foot for LB dressing while leaning against bed). Pt generally uses the walker during the day.  POSTURE COMMENTS:  Ind, no difficulty , supports self   ACTIVITY TOLERANCE: Activity tolerance: Pt reported increased fatigue during the day, such as from re-learning tasks since the stroke, walking far distances, and completing hand exercises.  FUNCTIONAL OUTCOME MEASURES: FOTO: 55    UPPER EXTREMITY ROM:    Active ROM Right eval Left eval  Shoulder flexion [facility]  Shoulder abduction    Shoulder adduction    Shoulder extension    Shoulder internal rotation    Shoulder external rotation    Elbow flexion    Elbow extension    Wrist flexion    Wrist extension    Wrist ulnar deviation    Wrist radial deviation     Wrist pronation    Wrist supination    (Blank rows = not tested)  Note: OT noted slightly slower movements of LUE compared to RUE.  UPPER EXTREMITY MMT:     MMT Right eval Left eval  Shoulder flexion 4 4-  Shoulder abduction 4 4-  Shoulder adduction    Shoulder extension    Shoulder internal rotation    Shoulder external rotation    Middle trapezius    Lower trapezius    Elbow flexion    Elbow extension    Wrist flexion    Wrist extension    Wrist ulnar deviation    Wrist radial deviation    Wrist pronation    Wrist supination    (Blank rows = not tested)  HAND FUNCTION: Grip strength: Right: 34.9 lbs; Left: 43.8 lbs - average of 3 trials  OT noted increased shaking of LUE compared to RUE when pt grasped dynamometer with LUE.  COORDINATION: 9 Hole Peg test: Right: 18 sec; Left: 64 sec  Several drops with LUE, shaking of LUE.  SENSATION: Pt reports tips of fingers have difficulty with grading of force: e.g. "I can't tell how tight I'm holding something."   Pt reports impaired sensation of LUE "Like there is a veil over my left arm." Pt reports numbness of L axilla. Pt reports "  tightness" of L bicep.  EDEMA: None noted  MUSCLE TONE: none noted  COGNITION: Overall cognitive status: Within functional limits for tasks assessed  VISION: Subjective report: Pt reports no changes in vision. Pt correctly read clock on wall. Baseline vision: Wears glasses all the time, Wears glasses for distance and reading. Visual history: cataracts - pt reports doctor says cataracts are "starting" though no treatment yet.  VISION ASSESSMENT: Not tested Appeared [facility] when completing other assessment tasks   Patient has difficulty with following activities due to following visual impairments: none noted.  PERCEPTION: Not tested  PRAXIS: Not tested  OBSERVATIONS: Pt accompanied by husband. Pt appeared well-kept. Pt ambulated with 2-wheeled rolling walker with slow altered  gait.   TODAY'S TREATMENT:                                                                                                                              DATE:   Neuro Re-Ed: OT educated on sensory precautions for sensory loss. Pt verbalized understanding. Handout provided, see pt instructions.  Grasp of styrofoam cups - to improve LUE grading of force, to improve proproioception with visual cue of styrofoam cup - Pt demo'd good proprioception and grading of force to alternate between heavy and light grasp of object.  Finding and grasping objects in bin of dried rice with eyes occluded - to improve LUE stereognosis, to improve LUE proprioception, to improve sensation of LUE. Pt participated in task by finding objects of comparative sizes (e.g. find an object bigger than the block.")  Self-Care Folding laundry - standing with gait belt and supervision assist - to improve ind with IADL tasks, to improve dynamic balance and standing tolerance, to improve LUE functional reach in all directions including across midline, to improve LUE FM coordination and dexterity and bilateral integration - Pt folded and used hangers to complete laundry task with shirts, jackets, pants. Pt demo'd standing tolerance for approx. 12 minutes with great balance and functional reach of LUE. Pt demo'd improved LUE coordination compared to previous sessions in order to efficiently button buttons and engage zippers of clothing.  PATIENT EDUCATION: Education details: See today's treatment above, joint protection, body mechanics/ergonomics, purpose of each OT session task, shoulder UE anatomy, avoiding pinching/high pain during shoulder stretch HEP Person educated: Patient and Spouse Education method: Explanation, Demonstration, Tactile cues, Verbal cues, and Handouts Education comprehension: verbalized understanding and returned demonstration  HOME EXERCISE PROGRAM: 09/03/23 - Shoulder and theraputty. Access Code:  [email or web address] 09/07/23 - Coordination HEP (handout, see pt instructions) 09/10/23 - Sensory precautions and sensory activities (handout, see pt instructions)   GOALS: Goals reviewed with patient? Yes  SHORT TERM GOALS: Target date: 09/25/23  Patient will demonstrate improved FM coordination by completing nine-hole peg with use of LUE in 53 seconds or less with no more than 3 drops. Baseline: Left: 64 sec, Several drops with LUE Goal status: INITIAL  2.  Pt will demonstrate independence with initial HEP for  coordination and theraputty of LUE. Baseline: New to outpt OT Goal status: INITIAL  3. Patient will demonstrate at least 5 lbs increase of LUE grip strength (average 3 trials) and stable LUE grasp as needed to open jars and other containers.  Baseline: Left: 43.8 lbs, shaking of LUE when grasping dynamometer  Goal status: INITIAL  4. Pt will report little difficulty picking a coin up from the table using LUE.  Baseline: Per FOTO, pt picks up coins from table "with much difficulty" Goal status: INITIAL   LONG TERM GOALS: Target date: 10/23/23  Patient will demonstrate improved FM coordination by completing nine-hole peg with use of LUE in 40 seconds or less with no more than 1 drop. Baseline: Left: 64 sec, Several drops with LUE Goal status: INITIAL  2.  Pt will demonstrate independence with updated HEP PRN. Baseline: New to outpt OT Goal status: INITIAL  3.  Pt will participate in cooking/baking task safely at mod I level. Baseline: pt has not attempted cooking/baking tasks since stroke Goal status: INITIAL  4.  Pt will report no more than mild difficulty with carrying an item in her left, affected hand to improve participation in light household tasks (e.g. dishwashing, laundry). Baseline: pt reports difficulty carrying heavy items Goal status: INITIAL  5.  Pt will complete D/C FOTO Baseline: Intake FOTO completed at eval Goal status:  INITIAL    ASSESSMENT:  CLINICAL IMPRESSION: Pt tolerated tasks well today and demo'd improved coordination, proprioception, and functional reach of LUE compared to previous sessions. Pt appeared more upbeat and reported many improvements for functional tasks at home using LUE. Pt would continue to benefit from skilled OT services in the outpatient setting to work on impairments as noted below to help pt return to PLOF as able.     PERFORMANCE DEFICITS: in functional skills including ADLs, IADLs, coordination, dexterity, proprioception, sensation, ROM, strength, Fine motor control, Gross motor control, balance, body mechanics, endurance, and UE functional use, cognitive skills including energy/drive, and psychosocial skills including environmental adaptation.   IMPAIRMENTS: are limiting patient from ADLs, IADLs, leisure, and social participation.   CO-MORBIDITIES: may have co-morbidities  that affects occupational performance. Patient will benefit from skilled OT to address above impairments and improve overall function.  MODIFICATION OR ASSISTANCE TO COMPLETE EVALUATION: No modification of tasks or assist necessary to complete an evaluation.  OT OCCUPATIONAL PROFILE AND HISTORY: Problem focused assessment: Including review of records relating to presenting problem.  CLINICAL DECISION MAKING: Moderate - several treatment options, min-mod task modification necessary  REHAB POTENTIAL: Good  EVALUATION COMPLEXITY: Low    PLAN:  OT FREQUENCY: 1-2x/week  (2x per week initially, progress to 1x per week PRN if pt makes good progress)  OT DURATION: 6 weeks (POC extended 2 weeks to allow for scheduling)  PLANNED INTERVENTIONS: 97168 OT Re-evaluation, 97535 self care/ADL training, 86578 therapeutic exercise, 97530 therapeutic activity, 97112 neuromuscular re-education, 97140 manual therapy, 97035 ultrasound, 97018 paraffin, 46962 fluidotherapy, 97010 moist heat, 97010 cryotherapy, 97032  electrical stimulation (manual), 97760 Orthotics management and training, 95284 Splinting (initial encounter), [id number] Subsequent splinting/medication, manual lymph drainage, passive range of motion, functional mobility training, visual/perceptual remediation/compensation, energy conservation, patient/family education, and DME and/or AE instructions  RECOMMENDED OTHER SERVICES: PT eval re-scheduled  CONSULTED AND AGREED WITH PLAN OF CARE: Patient and family member/caregiver  PLAN FOR NEXT SESSION:   Assess progress towards goals - adjust PRN based on pt progress  Progress B shoulder HEP for strengthening/stability  Blaze Pods?  Higher level  coordination tasks   Wynetta Emery, OT 09/10/2023, 4:33 PM

## 2023-09-10 NOTE — Therapy (Signed)
OUTPATIENT PHYSICAL THERAPY NEURO TREATMENT   Patient Name: Priscilla Wilson MRN: [id number] DOB:25-May-1951, 72 y.o., female Today's Date: 09/10/2023   PCP: Sigmund Hazel, MD REFERRING PROVIDER: Mariam Dollar, PA-C  END OF SESSION:  PT End of Session - 09/10/23 1441     Visit Number 3    Number of Visits 13    Date for PT Re-Evaluation 10/30/23    Authorization Type HTA    PT Start Time 1444    PT Stop Time 1529    PT Time Calculation (min) 45 min    Equipment Utilized During Treatment Gait belt    Activity Tolerance Patient tolerated treatment well              No past medical history on file. Past Surgical History:  Procedure Laterality Date   BREAST EXCISIONAL BIOPSY Left    benign   Patient Active Problem List   Diagnosis Date Noted   Essential hypertension 08/13/2023   Alteration of sensations, post-stroke 08/13/2023   Acute CVA (cerebrovascular accident) (HCC) 08/10/2023   TIA (transient ischemic attack) 08/03/2023   Stroke determined by clinical assessment (HCC) 08/03/2023    ONSET DATE: 08/14/23 referral  REFERRING DIAG: I63.9 (ICD-10-CM) - Cerebral infarction, unspecified   THERAPY DIAG:  Other lack of coordination  Muscle weakness (generalized)  Other disturbances of skin sensation  Other symptoms and signs involving the musculoskeletal system  Other symptoms and signs involving the nervous system  Unsteadiness on feet  Acute CVA (cerebrovascular accident) (HCC)  Dysphagia, unspecified type  Rationale for Evaluation and Treatment: Rehabilitation  SUBJECTIVE:                                                                                                                                                                                             SUBJECTIVE STATEMENT: Pt presents w/RW and husband. Reported some soreness in hip from HEP. Able to get around house without RW but takes RW out with her.   Pt accompanied by: significant other,  [name]  PERTINENT HISTORY: none on file  PAIN:  Are you having pain? No  PRECAUTIONS: Fall   WEIGHT BEARING RESTRICTIONS: No  FALLS: Has patient fallen in last 6 months? No some near falls when walking in her house, using furniture to regain balance  LIVING ENVIRONMENT: Lives with: lives with their family Lives in: House/apartment Stairs: Yes: Internal: flight steps; on right going up and External: 4 steps; bilateral but cannot reach both Has following equipment at home: Dan Humphreys - 2 wheeled, shower chair, and Grab bars  PLOF: Requires assistive device for independence  PATIENT GOALS: "my left leg needs work"  OBJECTIVE:  Note: Objective measures were completed at Evaluation unless otherwise noted.  DIAGNOSTIC FINDINGS: 08/04/23 Brain MRI IMPRESSION: 1. 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 Associated mild petechial blood products without frank hemorrhagic transformation or significant regional mass effect. 2. Underlying moderately advanced chronic microvascular ischemic disease.  COGNITION: Overall cognitive status: Within functional limits for tasks assessed   SENSATION: Intermittent areas of numbness in L hemibody  COORDINATION: L LE dysmetric and ataxic heel/shin and figure 8   POSTURE: No Significant postural limitations  LOWER EXTREMITY MMT:    MMT Right Eval Left Eval  Hip flexion 5 4  Hip extension    Hip abduction 5 4  Hip adduction 5 4  Hip internal rotation    Hip external rotation    Knee flexion 5 4  Knee extension 5 4  Ankle dorsiflexion 5 4  Ankle plantarflexion 5 4  Ankle inversion    Ankle eversion    (Blank rows = not tested)  BED MOBILITY:  Independent   TRANSFERS: Assistive device utilized: None  Sit to stand: SBA Stand to sit: SBA Chair to chair: SBA  STAIRS: Level of Assistance: SBA Stair Negotiation Technique: Step to Pattern with Single Rail on Right Number of Stairs: 4  Height of Stairs: 6   GAIT: Gait  pattern: step through pattern, decreased step length- Left, decreased stance time- Left, decreased hip/knee flexion- Left, circumduction- Left, Left steppage, Left foot flat, ataxic, wide BOS, and poor foot clearance- Left Distance walked: clinic Assistive device utilized: Environmental consultant - 2 wheeled and None Level of assistance: Modified independence and SBA    TODAY'S TREATMENT:    Assessed vitals  Bp 120/71 HR 65   Gait - 115' w/ RW, Supervision  - 115' x 2 no AD, CGA   Parallel Bars  - exaggerated heel toe walking  - calf raises  - side stepping w/ red theraband  - marches w/ red theraband   Balance - feet apart, feet together, staggered stance, CGA  - on airex, feet apart, feet together, staggered stance, CGA      PATIENT EDUCATION: Education details: See ther act, initial HEP  Person educated: Patient and Spouse Education method: Explanation, Demonstration, and Tactile cues Education comprehension: verbalized understanding and returned demonstration  HOME EXERCISE PROGRAM: Verbally added banded LAQ and seated march overs on 10/28 (pt declined handout)   GOALS: Goals reviewed with patient? Yes  SHORT TERM GOALS: Target date: 10/02/23  Pt will be independent with initial HEP for improved functional strength and balance  Baseline: to be provided Goal status: INITIAL  2.  Pt will improve TUG to </= 15 secs to demonstrated reduced fall risk  Baseline: 17.5s no AD Goal status: INITIAL  3.  Pt will improve 5x STS to </= 10 sec to demo improved functional LE strength and balance   Baseline: 11.6s no UE Goal status: INITIAL  4.  Pt will improve gait speed to >/= .62m/s m/s to demonstrate improved community ambulation  Baseline: .71m/s Goal status: INITIAL  5.  Pt will improve FGA to >/= 8/30 to demonstrate improved balance and reduced fall risk Baseline: 3/30 Goal status: INITIAL   LONG TERM GOALS: Target date: 10/30/23  Pt will be independent with initial HEP  for improved functional strength and balance  Baseline: to be provided Goal status: INITIAL  Pt will improve TUG to </= 12 secs to demonstrated reduced fall risk  Baseline: 17.5s no AD Goal status: INITIAL   Pt will improve gait  speed to >/= .6 m/s to demonstrate improved community ambulation  Baseline: .24m/s Goal status: INITIAL  Pt will improve FGA to >/= 13/30 to demonstrate improved balance and reduced fall risk Baseline: 3 Goal status: INITIAL  ASSESSMENT:  CLINICAL IMPRESSION: Pt. Present for skilled PT session  with emphasis on improved left LE strength and coordination. Pt. Did well with parallel bar exercises. Was able to control resistance of red theraband. Did well with balance exercise on the floor and with added challenge of airex. Could progress to perturbations in seated and standing to mimic dogs and grandchildren and improve balance confidence outside of the home. Did well with walking around gym without AD. Second lap around gym without AD pt. Reported quad tightness on the left with some noticeable ataxia while dual tasking of conversing with SPT. Told patient to continue using RW out of the home for now. Continue POC.    OBJECTIVE IMPAIRMENTS: Abnormal gait, decreased balance, decreased coordination, decreased endurance, decreased knowledge of condition, decreased mobility, difficulty walking, decreased strength, impaired sensation, impaired tone, and impaired UE functional use.   ACTIVITY LIMITATIONS: carrying, lifting, squatting, bed mobility, reach over head, hygiene/grooming, locomotion level, and caring for others  PARTICIPATION LIMITATIONS: meal prep, cleaning, interpersonal relationship, driving, shopping, and community activity  PERSONAL FACTORS: Age, Time since onset of injury/illness/exacerbation, and Transportation are also affecting patient's functional outcome.   REHAB POTENTIAL: Good  CLINICAL DECISION MAKING: Stable/uncomplicated  EVALUATION  COMPLEXITY: Low  PLAN:  PT FREQUENCY: 2x/week  PT DURATION: 6 weeks  PLANNED INTERVENTIONS: 97164- PT Re-evaluation, 97110-Therapeutic exercises, 97530- Therapeutic activity, 97112- Neuromuscular re-education, 97535- Self Care, 98119- Manual therapy, [phone]- Gait training, [phone]- Orthotic Fit/training, [phone]- Canalith repositioning, [phone]- Aquatic Therapy, Patient/Family education, Balance training, Stair training, Dry Needling, Vestibular training, Visual/preceptual remediation/compensation, and DME instructions  PLAN FOR NEXT SESSION: Monitor vitals. Add to HEP, L LE coordination, try weights on L ankle, stairs, perturbations, gait training     Bradd Burner Cherolyn Behrle, SPT  09/10/2023, 4:13 PM

## 2023-09-15 ENCOUNTER — Ambulatory Visit: Payer: PPO | Attending: Family Medicine

## 2023-09-15 ENCOUNTER — Ambulatory Visit: Payer: PPO | Admitting: Occupational Therapy

## 2023-09-15 DIAGNOSIS — R29818 Other symptoms and signs involving the nervous system: Secondary | ICD-10-CM | POA: Diagnosis not present

## 2023-09-15 DIAGNOSIS — R208 Other disturbances of skin sensation: Secondary | ICD-10-CM | POA: Insufficient documentation

## 2023-09-15 DIAGNOSIS — R29898 Other symptoms and signs involving the musculoskeletal system: Secondary | ICD-10-CM | POA: Insufficient documentation

## 2023-09-15 DIAGNOSIS — R278 Other lack of coordination: Secondary | ICD-10-CM | POA: Diagnosis not present

## 2023-09-15 DIAGNOSIS — M6281 Muscle weakness (generalized): Secondary | ICD-10-CM | POA: Insufficient documentation

## 2023-09-15 DIAGNOSIS — I639 Cerebral infarction, unspecified: Secondary | ICD-10-CM | POA: Insufficient documentation

## 2023-09-15 DIAGNOSIS — R2681 Unsteadiness on feet: Secondary | ICD-10-CM | POA: Diagnosis not present

## 2023-09-15 NOTE — Therapy (Addendum)
OUTPATIENT PHYSICAL THERAPY NEURO TREATMENT   Patient Name: Priscilla Wilson MRN: [id number] DOB:1951/11/01, 72 y.o., female Today's Date: 09/15/2023   PCP: Sigmund Hazel, MD REFERRING PROVIDER: Mariam Dollar, PA-C  END OF SESSION:  PT End of Session - 09/15/23 1400     Visit Number 4    Number of Visits 13    Date for PT Re-Evaluation 10/30/23    Authorization Type HTA    PT Start Time 1400    PT Stop Time 1445    PT Time Calculation (min) 45 min    Equipment Utilized During Treatment Gait belt    Activity Tolerance Patient tolerated treatment well              History reviewed. No pertinent past medical history. Past Surgical History:  Procedure Laterality Date   BREAST EXCISIONAL BIOPSY Left    benign   Patient Active Problem List   Diagnosis Date Noted   Essential hypertension 08/13/2023   Alteration of sensations, post-stroke 08/13/2023   Acute CVA (cerebrovascular accident) (HCC) 08/10/2023   TIA (transient ischemic attack) 08/03/2023   Stroke determined by clinical assessment (HCC) 08/03/2023    ONSET DATE: 08/14/23 referral  REFERRING DIAG: I63.9 (ICD-10-CM) - Cerebral infarction, unspecified   THERAPY DIAG:  Other lack of coordination  Unsteadiness on feet  Acute CVA (cerebrovascular accident) (HCC)  Muscle weakness (generalized)  Other disturbances of skin sensation  Other symptoms and signs involving the musculoskeletal system  Other symptoms and signs involving the nervous system  Rationale for Evaluation and Treatment: Rehabilitation  SUBJECTIVE:                                                                                                                                                                                             SUBJECTIVE STATEMENT: Pt presents w/RW and husband. "Sometimes my leg goes straight and sometimes it does its own thing". No falls, one near fall but caught herself.   Pt accompanied by: significant other,  [name]  PERTINENT HISTORY: none on file  PAIN:  Are you having pain? No  PRECAUTIONS: Fall   WEIGHT BEARING RESTRICTIONS: No  FALLS: Has patient fallen in last 6 months? No some near falls when walking in her house, using furniture to regain balance  LIVING ENVIRONMENT: Lives with: lives with their family Lives in: House/apartment Stairs: Yes: Internal: flight steps; on right going up and External: 4 steps; bilateral but cannot reach both Has following equipment at home: Dan Humphreys - 2 wheeled, shower chair, and Grab bars  PLOF: Requires assistive device for independence  PATIENT GOALS: "my left leg needs work"  OBJECTIVE:  Note: Objective measures were completed at Evaluation unless otherwise noted.  DIAGNOSTIC FINDINGS: 08/04/23 Brain MRI IMPRESSION: 1. 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 Associated mild petechial blood products without frank hemorrhagic transformation or significant regional mass effect. 2. Underlying moderately advanced chronic microvascular ischemic disease.  COGNITION: Overall cognitive status: Within functional limits for tasks assessed   SENSATION: Intermittent areas of numbness in L hemibody  COORDINATION: L LE dysmetric and ataxic heel/shin and figure 8   POSTURE: No Significant postural limitations  LOWER EXTREMITY MMT:    MMT Right Eval Left Eval  Hip flexion 5 4  Hip extension    Hip abduction 5 4  Hip adduction 5 4  Hip internal rotation    Hip external rotation    Knee flexion 5 4  Knee extension 5 4  Ankle dorsiflexion 5 4  Ankle plantarflexion 5 4  Ankle inversion    Ankle eversion    (Blank rows = not tested)  BED MOBILITY:  Independent   TRANSFERS: Assistive device utilized: None  Sit to stand: SBA Stand to sit: SBA Chair to chair: SBA  STAIRS: Level of Assistance: SBA Stair Negotiation Technique: Step to Pattern with Single Rail on Right Number of Stairs: 4  Height of Stairs: 6   GAIT: Gait  pattern: step through pattern, decreased step length- Left, decreased stance time- Left, decreased hip/knee flexion- Left, circumduction- Left, Left steppage, Left foot flat, ataxic, wide BOS, and poor foot clearance- Left Distance walked: clinic Assistive device utilized: Environmental consultant - 2 wheeled and None Level of assistance: Modified independence and SBA    TODAY'S TREATMENT:    Assessed vitals  BP 130/68 HR 64   115' gait no AD, CGA   Stairs - step ups forward, backward, lateral, no UE support, supervision   Perturbations  - sitting, standing  STS  - staggered stance, no UE support, supervision  - red band abd cue    PATIENT EDUCATION: Education details: See ther act, initial HEP  Person educated: Patient and Spouse Education method: Explanation, Demonstration, and Tactile cues Education comprehension: verbalized understanding and returned demonstration  HOME EXERCISE PROGRAM: Verbally added banded LAQ and seated march overs on 10/28 (pt declined handout)   GOALS: Goals reviewed with patient? Yes  SHORT TERM GOALS: Target date: 10/02/23  Pt will be independent with initial HEP for improved functional strength and balance  Baseline: to be provided Goal status: INITIAL  2.  Pt will improve TUG to </= 15 secs to demonstrated reduced fall risk  Baseline: 17.5s no AD Goal status: INITIAL  3.  Pt will improve 5x STS to </= 10 sec to demo improved functional LE strength and balance   Baseline: 11.6s no UE Goal status: INITIAL  4.  Pt will improve gait speed to >/= .37m/s m/s to demonstrate improved community ambulation  Baseline: .35m/s Goal status: INITIAL  5.  Pt will improve FGA to >/= 8/30 to demonstrate improved balance and reduced fall risk Baseline: 3/30 Goal status: INITIAL   LONG TERM GOALS: Target date: 10/30/23  Pt will be independent with initial HEP for improved functional strength and balance  Baseline: to be provided Goal status: INITIAL  Pt  will improve TUG to </= 12 secs to demonstrated reduced fall risk  Baseline: 17.5s no AD Goal status: INITIAL   Pt will improve gait speed to >/= .6 m/s to demonstrate improved community ambulation  Baseline: .48m/s Goal status: INITIAL  Pt will improve FGA to >/= 13/30 to demonstrate  improved balance and reduced fall risk Baseline: 3 Goal status: INITIAL  ASSESSMENT:  CLINICAL IMPRESSION: Pt. present for skilled PT session with emphasis on improved left LE strength and coordination. Walking 115' without AD went well, some ataxia present left LE. Pt. Is aware and paid attention to her gait pattern to be sure of foot clearance and knee flexion. Going up stairs with hand rails with reciprocal pattern looked safe. Going down stairs with hand rails was more challenging, was able to do reciprocal pattern with some compensatory techniques present. Handled perturbations well while she was prepared for it. Perturbations completed in seated and standing. Seated in A/P plane, lateral plane, rotational at shoulders. Posterior and rotational to left most challenging, indicative of mild trunk weakness. Work up to idea of dog jumping up on her unexpectedly. Working on keeping weight even during sit to stands to not favor right LE. Possibly some extensor tone present in left LE Pt. Reported tightness in hip and knee, most noticeable to SPT when descending stairs L LE kicks into extension with some adduction. Continue POC.    OBJECTIVE IMPAIRMENTS: Abnormal gait, decreased balance, decreased coordination, decreased endurance, decreased knowledge of condition, decreased mobility, difficulty walking, decreased strength, impaired sensation, impaired tone, and impaired UE functional use.   ACTIVITY LIMITATIONS: carrying, lifting, squatting, bed mobility, reach over head, hygiene/grooming, locomotion level, and caring for others  PARTICIPATION LIMITATIONS: meal prep, cleaning, interpersonal relationship,  driving, shopping, and community activity  PERSONAL FACTORS: Age, Time since onset of injury/illness/exacerbation, and Transportation are also affecting patient's functional outcome.   REHAB POTENTIAL: Good  CLINICAL DECISION MAKING: Stable/uncomplicated  EVALUATION COMPLEXITY: Low  PLAN:  PT FREQUENCY: 2x/week  PT DURATION: 6 weeks  PLANNED INTERVENTIONS: 97164- PT Re-evaluation, 97110-Therapeutic exercises, 97530- Therapeutic activity, 97112- Neuromuscular re-education, 97535- Self Care, 16109- Manual therapy, [phone]- Gait training, [phone]- Orthotic Fit/training, [phone]- Canalith repositioning, [phone]- Aquatic Therapy, Patient/Family education, Balance training, Stair training, Dry Needling, Vestibular training, Visual/preceptual remediation/compensation, and DME instructions  PLAN FOR NEXT SESSION: Monitor vitals. Add to HEP, L LE coordination, try weights on L ankle, stairs, perturbations, gait training, quadruped, tall kneeling    Juelle Dickmann M Reneka Nebergall, SPT  09/15/2023, 2:56 PM

## 2023-09-15 NOTE — Therapy (Signed)
OUTPATIENT OCCUPATIONAL THERAPY Treatment  Patient Name: Priscilla Wilson MRN: [id number] DOB:Dec 25, 1950, 72 y.o., female Today's Date: 09/15/2023  PCP: Sigmund Hazel, MD  REFERRING PROVIDER: Charlton Amor, PA-C   END OF SESSION:  OT End of Session - 09/15/23 1449     Visit Number 5    Number of Visits 12    Date for OT Re-Evaluation 10/23/23    Authorization Type [employer] - follows Medicare guidelines    Progress Note Due on Visit 10    OT Start Time 1450    OT Stop Time 1530    OT Time Calculation (min) 40 min    Activity Tolerance Patient tolerated treatment well    Behavior During Therapy [facility] for tasks assessed/performed            No past medical history on file. Past Surgical History:  Procedure Laterality Date   BREAST EXCISIONAL BIOPSY Left    benign   Patient Active Problem List   Diagnosis Date Noted   Essential hypertension 08/13/2023   Alteration of sensations, post-stroke 08/13/2023   Acute CVA (cerebrovascular accident) (HCC) 08/10/2023   TIA (transient ischemic attack) 08/03/2023   Stroke determined by clinical assessment (HCC) 08/03/2023   ONSET DATE: 08/14/23 (referral date), 08/03/23 (date of CVA)  REFERRING DIAG: I63.9 (ICD-10-CM) - Cerebral infarction, unspecified   THERAPY DIAG:  Other lack of coordination  Muscle weakness (generalized)  Other disturbances of skin sensation  Other symptoms and signs involving the musculoskeletal system  Other symptoms and signs involving the nervous system  Rationale for Evaluation and Treatment: Rehabilitation  SUBJECTIVE:   SUBJECTIVE STATEMENT: PT reports pt states she is having more ataxia with LUE recently but no other s/s of acute stroke like activity.   Pt accompanied by: self and significant other (husband: Priscilla Wilson)  PERTINENT HISTORY: Acute CVA, Essential HTN, alteration of sensation post-stroke, hyperthyroidism, asthma, hyperlipidemia, GERD, pre-diabetes  Per 08/21/23 D/C  Summary: "Priscilla Wilson is a 72 y.o. female presented to the [facility] ED on 08/03/2023 with sudden onset of left-sided numbness and weakness... 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 2D echo LVEF 70-75%. Hgb A1C 6.3%."   PRECAUTIONS: None Pt reported "they say I'm not allowed to drive" since stroke.  WEIGHT BEARING RESTRICTIONS: No  PAIN:  Are you having pain? Pt reports 1/10 pain, chronic arthritis pain, which improves at rest.  FALLS: Has patient fallen in last 6 months? No  LIVING ENVIRONMENT: Lives with: lives with their spouse Lives in: House/apartment Stairs: Yes: Internal: 16 steps; on left going up and External: 3 steps; bilateral but cannot reach both - Pt reported not needing to go upstairs during the day because all appliances and room accessible at home. Has following equipment at home: Dan Humphreys - 2 wheeled, shower chair, and Grab bars  PLOF: Independent, pt driving, pt retired  PATIENT GOALS: Pt reported "I don't have all my feeling back in L arm, but it's come a long way." "I want to be able to do all the things I did before." Pt reports wanting to get back to line dancing and driving, to "get rid of walker," and to "make Christmas cookies.  OBJECTIVE:  Note: Objective measures were completed at Evaluation unless otherwise noted.  HAND DOMINANCE: Right  ADLs: Overall ADLs: ind, sometimes with supervision for safety Transfers/ambulation related to ADLs: ind with supervision Eating: ind Grooming: ind UB Dressing:  ind, no difficulty with hooking bra, manipulating buttons, zippers LB Dressing: ind Toileting: ind with grab bar next to toilet Bathing: ind Tub Shower transfers: no difficulty; with supervision  Equipment: Walk in shower and walk-in tub  IADLs: Shopping: dependent Light housekeeping: have not  attempted some tasks (e.g. laundry, dishwashing), ind wiping table and pt reports she is "pretty sure I can do [employer], putting dishes in dishwasher]". Meal Prep: currently dependent because husband wants to complete tasks. Pt reports she may be able to complete cutting tasks. Pt reports difficulty carrying heavy objects, such as pan or tray.  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 Pt's spouse reports pt has some unsteadiness "from time-to-time" though reports it has improved since stroke. Pt reports some difficulty with balance when moving during some tasks (e.g. attempting to stand on one foot for LB dressing while leaning against bed). Pt generally uses the walker during the day.  POSTURE COMMENTS:  Ind, no difficulty , supports self   ACTIVITY TOLERANCE: Activity tolerance: Pt reported increased fatigue during the day, such as from re-learning tasks since the stroke, walking far distances, and completing hand exercises.  FUNCTIONAL OUTCOME MEASURES: FOTO: 55    UPPER EXTREMITY ROM:    Active ROM Right eval Left eval  Shoulder flexion [facility]  Shoulder abduction    Shoulder adduction    Shoulder extension    Shoulder internal rotation    Shoulder external rotation    Elbow flexion    Elbow extension    Wrist flexion    Wrist extension    Wrist ulnar deviation    Wrist radial deviation    Wrist pronation    Wrist supination    (Blank rows = not tested)  Note: OT noted slightly slower movements of LUE compared to RUE.  UPPER EXTREMITY MMT:     MMT Right eval Left eval  Shoulder flexion 4 4-  Shoulder abduction 4 4-  Shoulder adduction    Shoulder extension    Shoulder internal rotation    Shoulder external rotation    Middle trapezius    Lower trapezius    Elbow flexion    Elbow extension     Wrist flexion    Wrist extension    Wrist ulnar deviation    Wrist radial deviation    Wrist pronation    Wrist supination    (Blank rows = not tested)  HAND FUNCTION: Grip strength: Right: 34.9 lbs; Left: 43.8 lbs - average of 3 trials  OT noted increased shaking of LUE compared to RUE when pt grasped dynamometer with LUE.  COORDINATION: 9 Hole Peg test: Right: 18 sec; Left: 64 sec  Several drops with LUE, shaking of LUE.  SENSATION: Pt reports tips of fingers have difficulty with grading of force: e.g. "I can't tell how tight I'm holding something."   Pt reports impaired sensation of LUE "Like there is a veil over my left arm." Pt reports numbness of L axilla. Pt reports "tightness" of L bicep.  EDEMA: None noted  MUSCLE TONE: none noted  COGNITION: Overall cognitive status: Within functional limits for tasks assessed  VISION: Subjective report: Pt reports no changes in vision. Pt correctly read clock on wall. Baseline vision: Wears glasses all the time, Wears glasses for distance and reading. Visual history: cataracts - pt reports doctor says cataracts are "starting" though no treatment yet.  VISION ASSESSMENT: Not tested Appeared [facility] when completing other assessment tasks   Patient has difficulty with following activities due to following visual impairments: none noted.  PERCEPTION: Not tested  PRAXIS: Not tested  OBSERVATIONS: Pt accompanied by husband. Pt appeared well-kept. Pt ambulated with 2-wheeled rolling walker with slow altered gait.   TODAY'S TREATMENT:                                                                                                                               Neuro Re-Ed: OT initiated LUE HEP including:   Wall push-ups  ABCs with LUE with ball at wall  BUE overhead shoulder flex, abduction, elbow flex/ext, wrist flex/ext, supination/pronation at mirror for comparison to RUE to promote improved coordination  BUE overhead  shoulder flex, elbow flex/ext, wrist flex/ext at mirror for comparison to RUE to promote improved coordination with foam roller  BUE overhead shoulder flex, elbow flex/ext, wrist flex/ext at mirror for comparison to RUE to promote improved coordination with Lifts with 9 lb dumbbell  OT initiated Yellow theraband for completion of prior LUE rotator cuff exercises for strengthening.   PATIENT EDUCATION: Education details: See today's treatment above Person educated: Patient and Spouse Education method: Explanation, Demonstration, Tactile cues, Verbal cues, and Handouts Education comprehension: verbalized understanding and returned demonstration  HOME EXERCISE PROGRAM: 09/03/23 - Shoulder and theraputty. Access Code: [email or web address] 09/07/23 - Coordination HEP (handout, see pt instructions) 09/10/23 - Sensory precautions and sensory activities (handout, see pt instructions) 09/15/2023: LUE HEP Access Code [email or web address]   GOALS: Goals reviewed with patient? Yes  SHORT TERM GOALS: Target date: 09/25/23  Patient will demonstrate improved FM coordination by completing nine-hole peg with use of LUE in 53 seconds or less with no more than 3 drops. Baseline: Left: 64 sec, Several drops with LUE Goal status: INITIAL  2.  Pt will demonstrate independence with initial HEP for coordination and theraputty of LUE. Baseline: New to outpt OT Goal status: INITIAL  3. Patient will demonstrate at least 5 lbs increase of LUE grip strength (average 3 trials) and stable LUE grasp as needed to open jars and other containers.  Baseline: Left: 43.8 lbs, shaking of LUE when grasping dynamometer  Goal status: INITIAL  4. Pt will report little difficulty picking a coin up from the table using LUE.  Baseline: Per FOTO, pt picks up coins from table "with much difficulty" Goal status: INITIAL   LONG TERM GOALS: Target date: 10/23/23  Patient will demonstrate improved FM coordination by completing nine-hole peg with use  of LUE in 40 seconds or less with no more than 1 drop. Baseline: Left: 64 sec, Several drops with LUE Goal status: INITIAL  2.  Pt will demonstrate independence with updated HEP PRN. Baseline: New to outpt OT Goal status: INITIAL  3.  Pt will participate in cooking/baking task safely at mod I level. Baseline: pt has not attempted cooking/baking tasks since stroke Goal status: INITIAL  4.  Pt will report no more than mild difficulty with carrying an item in her left, affected hand to improve participation in light household tasks (e.g. dishwashing, laundry). Baseline: pt reports difficulty carrying heavy items Goal status: INITIAL  5.  Pt will complete D/C FOTO Baseline: Intake FOTO completed at eval Goal status: INITIAL    ASSESSMENT:  CLINICAL IMPRESSION: Pt demonstrates good understanding of LUE exercises with some fatiguing. Recommend increasing proprioception through LUE for improved coordination.   PERFORMANCE DEFICITS: in functional skills including ADLs, IADLs, coordination, dexterity, proprioception, sensation, ROM, strength, Fine motor control, Gross motor control, balance, body mechanics, endurance, and UE functional use, cognitive skills including energy/drive, and psychosocial skills including environmental adaptation.   IMPAIRMENTS: are limiting patient from ADLs, IADLs, leisure, and social participation.   CO-MORBIDITIES: may have co-morbidities  that affects occupational performance. Patient will benefit from skilled OT to address above impairments and improve overall function.  REHAB POTENTIAL: Good  PLAN:  OT FREQUENCY: 1-2x/week  (2x per week initially, progress to 1x per week PRN if pt makes good progress)  OT DURATION: 6 weeks (POC extended 2 weeks to allow for scheduling)  PLANNED INTERVENTIONS: 97168 OT Re-evaluation, 97535 self care/ADL training, 28413 therapeutic exercise, 97530 therapeutic activity, 97112 neuromuscular re-education, 97140 manual  therapy, 97035 ultrasound, 97018 paraffin, 24401 fluidotherapy, 97010 moist heat, 97010 cryotherapy, 97032 electrical stimulation (manual), 97760 Orthotics management and training, 02725 Splinting (initial encounter), [id number] Subsequent splinting/medication, manual lymph drainage, passive range of motion, functional mobility training, visual/perceptual remediation/compensation, energy conservation, patient/family education, and DME and/or AE instructions  RECOMMENDED OTHER SERVICES: PT eval re-scheduled  CONSULTED AND AGREED WITH PLAN OF CARE: Patient and family member/caregiver  PLAN FOR NEXT SESSION: PROPRIOCEPTIVE INPUT through LUE  Assess progress towards goals - adjust PRN based on pt progress  Progress B shoulder HEP for strengthening/stability  [employer]?  Higher level coordination tasks   Delana Meyer, OT 09/15/2023, 5:26 PM

## 2023-09-17 ENCOUNTER — Ambulatory Visit: Payer: PPO | Admitting: Occupational Therapy

## 2023-09-17 ENCOUNTER — Ambulatory Visit: Payer: PPO

## 2023-09-17 VITALS — BP 132/70 | HR 70

## 2023-09-17 DIAGNOSIS — R278 Other lack of coordination: Secondary | ICD-10-CM

## 2023-09-17 DIAGNOSIS — R208 Other disturbances of skin sensation: Secondary | ICD-10-CM

## 2023-09-17 DIAGNOSIS — M6281 Muscle weakness (generalized): Secondary | ICD-10-CM

## 2023-09-17 DIAGNOSIS — R29818 Other symptoms and signs involving the nervous system: Secondary | ICD-10-CM

## 2023-09-17 DIAGNOSIS — R2681 Unsteadiness on feet: Secondary | ICD-10-CM

## 2023-09-17 DIAGNOSIS — R29898 Other symptoms and signs involving the musculoskeletal system: Secondary | ICD-10-CM

## 2023-09-17 NOTE — Therapy (Signed)
OUTPATIENT PHYSICAL THERAPY NEURO TREATMENT   Patient Name: Priscilla Wilson MRN: [id number] DOB:Oct 17, 1951, 72 y.o., female Today's Date: 09/18/2023   PCP: Sigmund Hazel, MD REFERRING PROVIDER: Mariam Dollar, PA-C  END OF SESSION:  PT End of Session - 09/17/23 1532     Visit Number 5    Number of Visits 13    Date for PT Re-Evaluation 10/30/23    Authorization Type HTA    PT Start Time 1532    PT Stop Time 1615    PT Time Calculation (min) 43 min    Equipment Utilized During Treatment Gait belt    Activity Tolerance Patient tolerated treatment well              History reviewed. No pertinent past medical history. Past Surgical History:  Procedure Laterality Date   BREAST EXCISIONAL BIOPSY Left    benign   Patient Active Problem List   Diagnosis Date Noted   Essential hypertension 08/13/2023   Alteration of sensations, post-stroke 08/13/2023   Acute CVA (cerebrovascular accident) (HCC) 08/10/2023   TIA (transient ischemic attack) 08/03/2023   Stroke determined by clinical assessment (HCC) 08/03/2023    ONSET DATE: 08/14/23 referral  REFERRING DIAG: I63.9 (ICD-10-CM) - Cerebral infarction, unspecified   THERAPY DIAG:  Other lack of coordination  Muscle weakness (generalized)  Other symptoms and signs involving the musculoskeletal system  Other symptoms and signs involving the nervous system  Unsteadiness on feet  Rationale for Evaluation and Treatment: Rehabilitation  SUBJECTIVE:                                                                                                                                                                                             SUBJECTIVE STATEMENT: Patient presented to therapy with RW and husband. Denied falls and near falls. Has been walking without RW around the home and stated she wants to walk without RW out of the home as well.   Pt accompanied by: significant other, [name]  PERTINENT HISTORY: none on  file  PAIN:  Are you having pain? No  PRECAUTIONS: Fall   WEIGHT BEARING RESTRICTIONS: No  FALLS: Has patient fallen in last 6 months? No some near falls when walking in her house, using furniture to regain balance  LIVING ENVIRONMENT: Lives with: lives with their family Lives in: House/apartment Stairs: Yes: Internal: flight steps; on right going up and External: 4 steps; bilateral but cannot reach both Has following equipment at home: Dan Humphreys - 2 wheeled, shower chair, and Grab bars  PLOF: Requires assistive device for independence  PATIENT GOALS: "my left leg needs work"  OBJECTIVE:  Note: Objective  measures were completed at Evaluation unless otherwise noted.  DIAGNOSTIC FINDINGS: 08/04/23 Brain MRI IMPRESSION: 1. 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 Associated mild petechial blood products without frank hemorrhagic transformation or significant regional mass effect. 2. Underlying moderately advanced chronic microvascular ischemic disease.  COGNITION: Overall cognitive status: Within functional limits for tasks assessed   SENSATION: Intermittent areas of numbness in L hemibody  COORDINATION: L LE dysmetric and ataxic heel/shin and figure 8   POSTURE: No Significant postural limitations  LOWER EXTREMITY MMT:    MMT Right Eval Left Eval  Hip flexion 5 4  Hip extension    Hip abduction 5 4  Hip adduction 5 4  Hip internal rotation    Hip external rotation    Knee flexion 5 4  Knee extension 5 4  Ankle dorsiflexion 5 4  Ankle plantarflexion 5 4  Ankle inversion    Ankle eversion    (Blank rows = not tested)  BED MOBILITY:  Independent   TRANSFERS: Assistive device utilized: None  Sit to stand: SBA Stand to sit: SBA Chair to chair: SBA  STAIRS: Level of Assistance: SBA Stair Negotiation Technique: Step to Pattern with Single Rail on Right Number of Stairs: 4  Height of Stairs: 6   GAIT: Gait pattern: step through pattern,  decreased step length- Left, decreased stance time- Left, decreased hip/knee flexion- Left, circumduction- Left, Left steppage, Left foot flat, ataxic, wide BOS, and poor foot clearance- Left Distance walked: clinic Assistive device utilized: Environmental consultant - 2 wheeled and None Level of assistance: Modified independence and SBA    TODAY'S TREATMENT:    Vitals: - WNL for PT session  - BP 132/70, HR 70   Gait:  - 230' with no AD; CGA  - Trial AFO: 230' with no AD; CGA   - Left LE; [place] AFO   - Try again next session with different shoes   Ther Ex:  - On mat table  - Half kneeling wood chop with 2 kg ball; 3 sets to fatigue   - Quadruped clamshells without theraband; 1 set   - Quadruped clamshells with red theraband; 2 sets to fatigue   - Tall kneeling heel sits; 3 sets to fatigue   - On red mat   - Demo of mat to floor transfers   PATIENT EDUCATION: Education details: See ther act, initial HEP  Person educated: Patient and Spouse Education method: Explanation, Demonstration, and Tactile cues Education comprehension: verbalized understanding and returned demonstration  HOME EXERCISE PROGRAM: Verbally added banded LAQ and seated march overs on 10/28 (pt declined handout)  Handout of quadruped clamshells and heel sits on 11/7   GOALS: Goals reviewed with patient? Yes  SHORT TERM GOALS: Target date: 10/02/23  Pt will be independent with initial HEP for improved functional strength and balance  Baseline: to be provided Goal status: INITIAL  2.  Pt will improve TUG to </= 15 secs to demonstrated reduced fall risk  Baseline: 17.5s no AD Goal status: INITIAL  3.  Pt will improve 5x STS to </= 10 sec to demo improved functional LE strength and balance   Baseline: 11.6s no UE Goal status: INITIAL  4.  Pt will improve gait speed to >/= .34m/s m/s to demonstrate improved community ambulation  Baseline: .64m/s Goal status: INITIAL  5.  Pt will improve FGA to >/= 8/30 to  demonstrate improved balance and reduced fall risk Baseline: 3/30 Goal status: INITIAL   LONG TERM GOALS: Target date: 10/30/23  Pt will be independent with initial HEP for improved functional strength and balance  Baseline: to be provided Goal status: INITIAL  Pt will improve TUG to </= 12 secs to demonstrated reduced fall risk  Baseline: 17.5s no AD Goal status: INITIAL   Pt will improve gait speed to >/= .6 m/s to demonstrate improved community ambulation  Baseline: .45m/s Goal status: INITIAL  Pt will improve FGA to >/= 13/30 to demonstrate improved balance and reduced fall risk Baseline: 3 Goal status: INITIAL  ASSESSMENT:  CLINICAL IMPRESSION: Patient present for skilled PT session with emphasis on improved left LE strength and function. She did well with foot clearance without AD while walking, still some presence of ataxia. Trial AFO seemed to help with supination but another trial needed with different shoes to see if this would be a good option for her. She seemed willing to try if it will help her be able to walk without RW safely. Patient did really well with mat table exercises. For half kneeling wood chop exercise, had left LE on knee and right LE in front, challenged strength and stability of left glutes. For quadruped clam shell exercise, she was able to lift left LE against red theraband with good ROM. She verbalized it was more challenging when lifting right LE and having to stabilize herself with left LE. Did well with heel sit exercise, can be progressed to add resistance around waist. Continue POC.    OBJECTIVE IMPAIRMENTS: Abnormal gait, decreased balance, decreased coordination, decreased endurance, decreased knowledge of condition, decreased mobility, difficulty walking, decreased strength, impaired sensation, impaired tone, and impaired UE functional use.   ACTIVITY LIMITATIONS: carrying, lifting, squatting, bed mobility, reach over head, hygiene/grooming,  locomotion level, and caring for others  PARTICIPATION LIMITATIONS: meal prep, cleaning, interpersonal relationship, driving, shopping, and community activity  PERSONAL FACTORS: Age, Time since onset of injury/illness/exacerbation, and Transportation are also affecting patient's functional outcome.   REHAB POTENTIAL: Good  CLINICAL DECISION MAKING: Stable/uncomplicated  EVALUATION COMPLEXITY: Low  PLAN:  PT FREQUENCY: 2x/week  PT DURATION: 6 weeks  PLANNED INTERVENTIONS: 97164- PT Re-evaluation, 97110-Therapeutic exercises, 97530- Therapeutic activity, 97112- Neuromuscular re-education, 97535- Self Care, 14782- Manual therapy, [phone]- Gait training, [phone]- Orthotic Fit/training, [phone]- Canalith repositioning, [phone]- Aquatic Therapy, Patient/Family education, Balance training, Stair training, Dry Needling, Vestibular training, Visual/preceptual remediation/compensation, and DME instructions  PLAN FOR NEXT SESSION: Monitor vitals. Add to HEP, L LE coordination, try weights on L ankle, stairs, perturbations, gait training, quadruped, tall kneeling, line dancing    Trev Boley M Hershel Corkery, SPT  09/18/2023, 8:21 AM

## 2023-09-17 NOTE — Therapy (Signed)
OUTPATIENT OCCUPATIONAL THERAPY Treatment  Patient Name: Priscilla Wilson MRN: [id number] DOB:1951-01-25, 72 y.o., female Today's Date: 09/17/2023  PCP: Sigmund Hazel, MD  REFERRING PROVIDER: Charlton Amor, PA-C   END OF SESSION:  OT End of Session - 09/17/23 1539     Visit Number 6    Number of Visits 12    Date for OT Re-Evaluation 10/23/23    Authorization Type [employer] - follows Medicare guidelines    Progress Note Due on Visit 10    OT Start Time 1450    OT Stop Time 1531    OT Time Calculation (min) 41 min    Activity Tolerance Patient tolerated treatment well    Behavior During Therapy [facility] for tasks assessed/performed             No past medical history on file. Past Surgical History:  Procedure Laterality Date   BREAST EXCISIONAL BIOPSY Left    benign   Patient Active Problem List   Diagnosis Date Noted   Essential hypertension 08/13/2023   Alteration of sensations, post-stroke 08/13/2023   Acute CVA (cerebrovascular accident) (HCC) 08/10/2023   TIA (transient ischemic attack) 08/03/2023   Stroke determined by clinical assessment (HCC) 08/03/2023   ONSET DATE: 08/14/23 (referral date), 08/03/23 (date of CVA)  REFERRING DIAG: I63.9 (ICD-10-CM) - Cerebral infarction, unspecified   THERAPY DIAG:  Other lack of coordination  Muscle weakness (generalized)  Other disturbances of skin sensation  Other symptoms and signs involving the musculoskeletal system  Other symptoms and signs involving the nervous system  Rationale for Evaluation and Treatment: Rehabilitation  SUBJECTIVE:   SUBJECTIVE STATEMENT: Pt reports being very tired today d/t drinking coffee too late which led to restless sleep. Pt reports more feeling in L fingers. Pt reports completing HEP. Pt reported making pasta and oatmeal at stovetop ind. Pt buttered toast without dropping items on floor.  Pt accompanied by: self and significant other (husband: [name])  PERTINENT  HISTORY: Acute CVA, Essential HTN, alteration of sensation post-stroke, hyperthyroidism, asthma, hyperlipidemia, GERD, pre-diabetes  Per 08/21/23 D/C Summary: "Priscilla Wilson is a 72 y.o. female presented to the [facility] ED on 08/03/2023 with sudden onset of left-sided numbness and weakness... 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 2D echo LVEF 70-75%. Hgb A1C 6.3%."   PRECAUTIONS: None Pt reported "they say I'm not allowed to drive" since stroke.  WEIGHT BEARING RESTRICTIONS: No  PAIN:  Are you having pain? Pt reports 0/10 pain  FALLS: Has patient fallen in last 6 months? No  LIVING ENVIRONMENT: Lives with: lives with their spouse Lives in: House/apartment Stairs: Yes: Internal: 16 steps; on left going up and External: 3 steps; bilateral but cannot reach both - Pt reported not needing to go upstairs during the day because all appliances and room accessible at home. Has following equipment at home: Dan Humphreys - 2 wheeled, shower chair, and Grab bars  PLOF: Independent, pt driving, pt retired  PATIENT GOALS: Pt reported "I don't have all my feeling back in L arm, but it's come a long way." "I want to be able to do all the things I did before." Pt reports wanting to get back to line dancing and driving, to "get rid of walker," and to "make Christmas cookies.  OBJECTIVE:  Note: Objective measures were completed at Evaluation unless otherwise noted.  HAND DOMINANCE: Right  ADLs: Overall ADLs: ind, sometimes with  supervision for safety Transfers/ambulation related to ADLs: ind with supervision Eating: ind Grooming: ind UB Dressing: ind, no difficulty with hooking bra, manipulating buttons, zippers LB Dressing: ind Toileting: ind with grab bar next to toilet Bathing: ind Tub Shower transfers: no difficulty; with supervision   Equipment: Walk in shower and walk-in tub  IADLs: Shopping: dependent Light housekeeping: have not attempted some tasks (e.g. laundry, dishwashing), ind wiping table and pt reports she is "pretty sure I can do [employer], putting dishes in dishwasher]". Meal Prep: currently dependent because husband wants to complete tasks. Pt reports she may be able to complete cutting tasks. Pt reports difficulty carrying heavy objects, such as pan or tray.  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 Pt's spouse reports pt has some unsteadiness "from time-to-time" though reports it has improved since stroke. Pt reports some difficulty with balance when moving during some tasks (e.g. attempting to stand on one foot for LB dressing while leaning against bed). Pt generally uses the walker during the day.  POSTURE COMMENTS:  Ind, no difficulty , supports self   ACTIVITY TOLERANCE: Activity tolerance: Pt reported increased fatigue during the day, such as from re-learning tasks since the stroke, walking far distances, and completing hand exercises.  FUNCTIONAL OUTCOME MEASURES: FOTO: 55    UPPER EXTREMITY ROM:    Active ROM Right eval Left eval  Shoulder flexion [facility]  Shoulder abduction    Shoulder adduction    Shoulder extension    Shoulder internal rotation    Shoulder external rotation    Elbow flexion    Elbow extension    Wrist flexion    Wrist extension    Wrist ulnar deviation    Wrist radial deviation    Wrist pronation    Wrist supination    (Blank rows = not tested)  Note: OT noted slightly slower movements of LUE compared to RUE.  UPPER EXTREMITY MMT:     MMT Right eval Left eval  Shoulder flexion 4 4-  Shoulder abduction 4 4-  Shoulder adduction    Shoulder extension    Shoulder internal rotation     Shoulder external rotation    Middle trapezius    Lower trapezius    Elbow flexion    Elbow extension    Wrist flexion    Wrist extension    Wrist ulnar deviation    Wrist radial deviation    Wrist pronation    Wrist supination    (Blank rows = not tested)  HAND FUNCTION: Grip strength: Right: 34.9 lbs; Left: 43.8 lbs - average of 3 trials  OT noted increased shaking of LUE compared to RUE when pt grasped dynamometer with LUE.  COORDINATION: 9 Hole Peg test: Right: 18 sec; Left: 64 sec  Several drops with LUE, shaking of LUE.  SENSATION: Pt reports tips of fingers have difficulty with grading of force: e.g. "I can't tell how tight I'm holding something."   Pt reports impaired sensation of LUE "Like there is a veil over my left arm." Pt reports numbness of L axilla. Pt reports "tightness" of L bicep.  EDEMA: None noted  MUSCLE TONE: none noted  COGNITION: Overall cognitive status: Within functional limits for tasks assessed  VISION: Subjective report: Pt reports no changes in vision. Pt correctly read clock on wall. Baseline vision: Wears glasses all the time, Wears glasses for distance and reading.  Visual history: cataracts - pt reports doctor says cataracts are "starting" though no treatment yet.  VISION ASSESSMENT: Not tested Appeared [facility] when completing other assessment tasks   Patient has difficulty with following activities due to following visual impairments: none noted.  PERCEPTION: Not tested  PRAXIS: Not tested  OBSERVATIONS: Pt accompanied by husband. Pt appeared well-kept. Pt ambulated with 2-wheeled rolling walker with slow altered gait.   TODAY'S TREATMENT:                                                                                                                              DATE:   Neuro Re-Ed OT initiated yellow theraband HEP to increase LUE strengthening, coordination, proprioception, upright posture during exercises: Pt demo'd  understanding of all exercises.   Access Code: [email or web address] URL: [email or web address] Date: 09/17/2023 Prepared by: Carilyn Goodpasture  Exercises with yellow theraband in front of mirror for upright seated posture and symmetrical movements of LUE and RUE: Handout provided. - Seated Elbow Flexion with Self-Anchored Resistance  - 2 sets - 10 reps - Seated Shoulder Diagonal Pulls with Resistance  -  2 sets - 10 reps - V/c for upright posture and positioning. OT, pt's spouse, and pt discussed pt obtaining mirror for visual cues for posture. Pt acknowledged understanding. - Seated Shoulder Flexion with Self-Anchored Resistance  -  2 sets - 10 reps.- V/c to relax shoulders down d/t pt shrugging shoulders more each rep. - Seated Shoulder Horizontal Abduction with Resistance  - 2 sets - 10 reps. - V/c to keep arms at same height d/t pt dropping LUE compared to RUE. - Seated Bilateral Shoulder External Rotation with Resistance  -2 sets - 10 reps. V/c and tactile cues to keep elbows tucked in at sides.  OT provided education to pt to avoid specific exercises if increased pain or pinching at shoulder. Pt verbalized understanding though denied pain at shoulder, only typical mild soreness d/t completing strengthening exercises.  Therapeutic Activities Placing small pegs on pegboard following 3-color design and removing with tweezers - to improve LUE FM coordination, dexterity, precision - Dycem provided under pegboard to improve stability of pegboard. Pt demo'd some ataxic movements of LUE though ataxic movements appeared greatly reduced compared to previous OT sessions. Pt demo'd x3 drops of small pegs when placing pegs and x2 drops of tweezers when removing pegs.   PATIENT EDUCATION: Education details: See today's treatment above, core stability for distal mobility, mirror to improve posture using visual cues, proprioception of LUE, UE anatomy Person educated: Patient and Spouse Education method:  Explanation, Demonstration, Tactile cues, Verbal cues, and Handouts Education comprehension: verbalized understanding and returned demonstration  HOME EXERCISE PROGRAM: 09/03/23 - Shoulder and theraputty. Access Code: [email or web address] 09/07/23 - Coordination HEP (handout, see pt instructions) 09/10/23 - Sensory precautions and sensory activities (handout, see pt instructions)  09/17/23 -  Yellow Theraband  Access Code: [email or web address] URL: [email or web address] Date: 09/17/2023 Prepared by: Carilyn Goodpasture  Exercises - Seated Elbow  Flexion with Self-Anchored Resistance  - 2 x daily - 2 sets - 10 reps - Seated Shoulder Diagonal Pulls with Resistance  - 2 x daily - 2 sets - 10 reps - Seated Shoulder Flexion with Self-Anchored Resistance  - 2 x daily - 2 sets - 10 reps - Seated Shoulder Horizontal Abduction with Resistance  - 2 x daily - 2 sets - 10 reps - Seated Bilateral Shoulder External Rotation with Resistance  - 2 x daily - 2 sets - 10 reps   GOALS: Goals reviewed with patient? Yes  SHORT TERM GOALS: Target date: 09/25/23  Patient will demonstrate improved FM coordination by completing nine-hole peg with use of LUE in 53 seconds or less with no more than 3 drops. Baseline: Left: 64 sec, Several drops with LUE Goal status: INITIAL  2.  Pt will demonstrate independence with initial HEP for coordination and theraputty of LUE. Baseline: New to outpt OT Goal status: INITIAL  3. Patient will demonstrate at least 5 lbs increase of LUE grip strength (average 3 trials) and stable LUE grasp as needed to open jars and other containers.  Baseline: Left: 43.8 lbs, shaking of LUE when grasping dynamometer  Goal status: INITIAL  4. Pt will report little difficulty picking a coin up from the table using LUE.  Baseline: Per FOTO, pt picks up coins from table "with much difficulty" Goal status: INITIAL   LONG TERM GOALS: Target date: 10/23/23  Patient will demonstrate improved FM  coordination by completing nine-hole peg with use of LUE in 40 seconds or less with no more than 1 drop. Baseline: Left: 64 sec, Several drops with LUE Goal status: INITIAL  2.  Pt will demonstrate independence with updated HEP PRN. Baseline: New to outpt OT Goal status: INITIAL  3.  Pt will participate in cooking/baking task safely at mod I level. Baseline: pt has not attempted cooking/baking tasks since stroke Goal status: INITIAL  4.  Pt will report no more than mild difficulty with carrying an item in her left, affected hand to improve participation in light household tasks (e.g. dishwashing, laundry). Baseline: pt reports difficulty carrying heavy items Goal status: INITIAL  5.  Pt will complete D/C FOTO Baseline: Intake FOTO completed at eval Goal status: INITIAL  ASSESSMENT:  CLINICAL IMPRESSION: Pt tolerated tasks well today. Pt continues to demo mild ataxic movements when reaching with LUE though movements are reduced compared to previous OT sessions. Pt would continue to benefit from skilled OT services in the outpatient setting to work on impairments as noted below to help pt return to PLOF as able.     PERFORMANCE DEFICITS: in functional skills including ADLs, IADLs, coordination, dexterity, proprioception, sensation, ROM, strength, Fine motor control, Gross motor control, balance, body mechanics, endurance, and UE functional use, cognitive skills including energy/drive, and psychosocial skills including environmental adaptation.   IMPAIRMENTS: are limiting patient from ADLs, IADLs, leisure, and social participation.   CO-MORBIDITIES: may have co-morbidities  that affects occupational performance. Patient will benefit from skilled OT to address above impairments and improve overall function.  REHAB POTENTIAL: Good  PLAN:  OT FREQUENCY: 1-2x/week  (2x per week initially, progress to 1x per week PRN if pt makes good progress)  OT DURATION: 6 weeks (POC extended 2 weeks  to allow for scheduling)  PLANNED INTERVENTIONS: 97168 OT Re-evaluation, 97535 self care/ADL training, 78295 therapeutic exercise, 97530 therapeutic activity, 97112 neuromuscular re-education, 97140 manual therapy, 97035 ultrasound, 97018 paraffin, 62130 fluidotherapy, 97010 moist heat, 97010 cryotherapy, 86578  electrical stimulation (manual), 16109 Orthotics management and training, [phone] Splinting (initial encounter), [id number] Subsequent splinting/medication, manual lymph drainage, passive range of motion, functional mobility training, visual/perceptual remediation/compensation, energy conservation, patient/family education, and DME and/or AE instructions  RECOMMENDED OTHER SERVICES: PT eval re-scheduled  CONSULTED AND AGREED WITH PLAN OF CARE: Patient and family member/caregiver  PLAN FOR NEXT SESSION:   Assess progress towards STG  How are theraband exercises going?  Blaze Pods at tabletop level, standing  Higher level coordination tasks   Wynetta Emery, OT 09/17/2023, 4:58 PM

## 2023-09-21 ENCOUNTER — Encounter: Payer: Self-pay | Admitting: Adult Health

## 2023-09-21 ENCOUNTER — Ambulatory Visit: Payer: PPO | Admitting: Adult Health

## 2023-09-21 VITALS — BP 108/66 | HR 60 | Ht 60.0 in | Wt 137.0 lb

## 2023-09-21 DIAGNOSIS — I6381 Other cerebral infarction due to occlusion or stenosis of small artery: Secondary | ICD-10-CM

## 2023-09-21 NOTE — Patient Instructions (Addendum)
Continue working with physical and Occupational Therapy for hopeful ongoing recovery  Continue aspirin 81 mg daily  and Crestor 20 mg daily for secondary stroke prevention - refills can be obtained by your PCP, Dr. Hyacinth Meeker  Continue to follow up with PCP regarding blood pressure, cholesterol and prediabetes management  Maintain strict control of hypertension with blood pressure goal below 130/90, pre-diabetes with hemoglobin A1c goal below 7.0 % and cholesterol with LDL cholesterol (bad cholesterol) goal below 70 mg/dL.   Signs of a Stroke? Follow the BEFAST method:  Balance Watch for a sudden loss of balance, trouble with coordination or vertigo Eyes Is there a sudden loss of vision in one or both eyes? Or double vision?  Face: Ask the person to smile. Does one side of the face droop or is it numb?  Arms: Ask the person to raise both arms. Does one arm drift downward? Is there weakness or numbness of a leg? Speech: Ask the person to repeat a simple phrase. Does the speech sound slurred/strange? Is the person confused ? Time: If you observe any of these signs, call 911.        Thank you for coming to see [place] at [facility]. I hope we have been able to provide you high quality care today.  You may receive a patient satisfaction survey over the next few weeks. We would appreciate your feedback and comments so that we may continue to improve ourselves and the health of our patients.   Stroke Prevention Some medical conditions and lifestyle choices can lead to a higher risk for a stroke. You can help to prevent a stroke by eating healthy foods and exercising. It also helps to not smoke and to manage any health problems you may have. How can this condition affect me? A stroke is an emergency. It should be treated right away. A stroke can lead to brain damage or threaten your life. There is a better chance of surviving and getting better after a stroke if you get medical help  right away. What can increase my risk? The following medical conditions may increase your risk of a stroke: Diseases of the heart and blood vessels (cardiovascular disease). High blood pressure (hypertension). Diabetes. High cholesterol. Sickle cell disease. Problems with blood clotting. Being very overweight. Sleeping problems (obstructivesleep apnea). Other risk factors include: Being older than age 76. A history of blood clots, stroke, or mini-stroke (TIA). Race, ethnic background, or a family history of stroke. Smoking or using tobacco products. Taking birth control pills, especially if you smoke. Heavy alcohol and drug use. Not being active. What actions can I take to prevent this? Manage your health conditions High cholesterol. Eat a healthy diet. If this is not enough to manage your cholesterol, you may need to take medicines. Take medicines as told by your doctor. High blood pressure. Try to keep your blood pressure below 130/80. If your blood pressure cannot be managed through a healthy diet and regular exercise, you may need to take medicines. Take medicines as told by your doctor. Ask your doctor if you should check your blood pressure at home. Have your blood pressure checked every year. Diabetes. Eat a healthy diet and get regular exercise. If your blood sugar (glucose) cannot be managed through diet and exercise, you may need to take medicines. Take medicines as told by your doctor. Talk to your doctor about getting checked for sleeping problems. Signs of a problem can include: Snoring a lot. Feeling very tired. Make  sure that you manage any other conditions you have. Nutrition  Follow instructions from your doctor about what to eat or drink. You may be told to: Eat and drink fewer calories each day. Limit how much salt (sodium) you use to 1,500 milligrams (mg) each day. Use only healthy fats for cooking, such as olive oil, canola oil, and sunflower oil. Eat  healthy foods. To do this: Choose foods that are high in fiber. These include whole grains, and fresh fruits and vegetables. Eat at least 5 servings of fruits and vegetables a day. Try to fill one-half of your plate with fruits and vegetables at each meal. Choose low-fat (lean) proteins. These include low-fat cuts of meat, chicken without skin, fish, tofu, beans, and nuts. Eat low-fat dairy products. Avoid foods that: Are high in salt. Have saturated fat. Have trans fat. Have cholesterol. Are processed or pre-made. Count how many carbohydrates you eat and drink each day. Lifestyle If you drink alcohol: Limit how much you have to: 0-1 drink a day for women who are not pregnant. 0-2 drinks a day for men. Know how much alcohol is in your drink. In the [place] one drink equals one 12 oz bottle of beer ( ), one 5 oz glass of wine ( ), or one 1 oz glass of hard liquor (44mL). Do not smoke or use any products that have nicotine or tobacco. If you need help quitting, ask your doctor. Avoid secondhand smoke. Do not use drugs. Activity  Try to stay at a healthy weight. Get at least 30 minutes of exercise on most days, such as: Fast walking. Biking. Swimming. Medicines Take over-the-counter and prescription medicines only as told by your doctor. Avoid taking birth control pills. Talk to your doctor about the risks of taking birth control pills if: You are over 56 years old. You smoke. You get very bad headaches. You have had a blood clot. Where to find more information American Stroke Association: www.strokeassociation.org Get help right away if: You or a loved one has any signs of a stroke. "BE FAST" is an easy way to remember the warning signs: B - Balance. Dizziness, sudden trouble walking, or loss of balance. E - Eyes. Trouble seeing or a change in how you see. F - Face. Sudden weakness or loss of feeling of the face. The face or eyelid may droop on one side. A - Arms.  Weakness or loss of feeling in an arm. This happens all of a sudden and most often on one side of the body. S - Speech. Sudden trouble speaking, slurred speech, or trouble understanding what people say. T - Time. Time to call emergency services. Write down what time symptoms started. You or a loved one has other signs of a stroke, such as: A sudden, very bad headache with no known cause. Feeling like you may vomit (nausea). Vomiting. A seizure. These symptoms may be an emergency. Get help right away. Call your local emergency services (911 in the [place] Do not wait to see if the symptoms will go away. Do not drive yourself to the hospital. Summary You can help to prevent a stroke by eating healthy, exercising, and not smoking. It also helps to manage any health problems you have. Do not smoke or use any products that contain nicotine or tobacco. Get help right away if you or a loved one has any signs of a stroke. This information is not intended to replace advice given to you by your health care provider. Make sure  you discuss any questions you have with your health care provider. Document Revised: 09/29/2022 Document Reviewed: 09/29/2022 Elsevier Patient Education  2024 [employer].

## 2023-09-21 NOTE — Progress Notes (Signed)
[facility] [address] [place] [address] [phone]       HOSPITAL FOLLOW UP NOTE  Ms. Sivana Kolenovic Date of Birth:  October 20, 1951 Medical Record Number:  [id number]   Reason for Referral:  hospital stroke follow up    SUBJECTIVE:   CHIEF COMPLAINT:  Chief Complaint  Patient presents with   Follow-up    Rm 3, here with husband Maisie Fus Pt is here for hospital stroke follow up. Pt states she is doing well. States she has residual numbness on her fingers left hand, feeling weakness on left leg. States she has questions wether she can go back to taking her Meloxicam and Vitamin D    HPI:   Ms. Shawntell Morsey is a 72 y.o. female with history of GERD, HLD, asthma presenting initially to [facility] ED on 08/03/2023 with acute onset of left-sided numbness and incoordination at home.  Symptoms fluctuated while at outside hospital with brief improvement followed by return of symptoms prompting code stroke activation and patient was subsequently given TNKase by teleneurology.  Patient was then transported to [facility] hospital for further neurology evaluation.  MRI showed acute right thalamic capsular infarct associated with mild petechial blood products without frank hemorrhagic transformation.  EF 70 to 75%.  CTA head/neck unremarkable.  Etiology of stroke likely secondary to small vessel disease but recommended 30-day cardiac event monitor outpatient given hyperthyroidism and palpitation complaints.  Recommended DAPT for 3 weeks and aspirin alone and change home dose simvastatin to Crestor 20 mg daily.  Initially required Cleviprex drip for BP control, stabilized, started on metoprolol for tachycardia and hypertension and recommended outpatient PCP follow-up.  She was discharged to [facility] for ongoing therapy needs.   Today, 09/21/2023, patient is being seen for hospital follow-up accompanied by her husband.  Reports doing well since discharge.  Continues to have  left hand fingertip numbness and mild left hip flexor weakness. Notes gait impairment, feels like her left foot wants to roll inwards at times, use of RW outdoors, does not use indoors, no recent falls. Currently working with PT/OT, reports gradual improvement.  Denies new stroke/TIA symptoms.  Completed 3 weeks DAPT, remains on aspirin alone as well as Crestor without side effects Blood pressure well-controlled Currently wearing cardiac monitor, has 2 more weeks Has PCP f/u in December On meloxicam PTA for arthritis rx'd by [facility] rheumatology, d/c'd during hospitalization. She questions restart of meloxicam as well as vitamin D with calcium.      PERTINENT IMAGING  CT head no acute intracranial hemorrhage or evidence of acute large vessel territory infarct.  Aspect score is 10.  Mild chronic small vessel disease. CTA head & neck no LVO, hemodynamically significant stenosis, or aneurysm in the head or neck. 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  Associated mild petechial blood products without frank hemorrhagic transformation or significant regional mass effect.  Underlying moderately advanced chronic microvascular ischemic disease. 2D Echo LVEF 70 to 75%.  The left ventricle has hyperdynamic function.  Right ventricular systolic function is hyperdynamic. LDL 88 HgbA1c 6.3    ROS:   14 system review of systems performed and negative with exception of those listed in HPI  PMH: History reviewed. No pertinent past medical history.  PSH:  Past Surgical History:  Procedure Laterality Date   BREAST EXCISIONAL BIOPSY Left    benign    Social History:  Social History   Socioeconomic History   Marital status: Married    Spouse  name: Not on file   Number of children: Not on file   Years of education: Not on file   Highest education level: Not on file  Occupational History   Not on file  Tobacco Use   Smoking status: Never    Passive  exposure: Past   Smokeless tobacco: Never  Vaping Use   Vaping status: Never Used  Substance and Sexual Activity   Alcohol use: Not Currently   Drug use: Never   Sexual activity: Yes  Other Topics Concern   Not on file  Social History Narrative   Not on file   Social Determinants of Health   Financial Resource Strain: Not on file  Food Insecurity: No Food Insecurity (08/05/2023)   Hunger Vital Sign    Worried About Running Out of Food in the Last Year: Never true    Ran Out of Food in the Last Year: Never true  Transportation Needs: No Transportation Needs (08/05/2023)   PRAPARE - Administrator, Civil Service (Medical): No    Lack of Transportation (Non-Medical): No  Physical Activity: Not on file  Stress: Not on file  Social Connections: Not on file  Intimate Partner Violence: Not At Risk (08/05/2023)   Humiliation, Afraid, Rape, and Kick questionnaire    Fear of Current or Ex-Partner: No    Emotionally Abused: No    Physically Abused: No    Sexually Abused: No    Family History:  Family History  Problem Relation Age of Onset   Cancer - Ovarian Sister     Medications:   Current Outpatient Medications on File Prior to Visit  Medication Sig Dispense Refill   acetaminophen (TYLENOL) 325 MG tablet 1-2 tablets as needed for mild pain Orally every 4 hrs     aspirin EC 81 MG tablet 1 tablet Orally Once a day     famotidine (PEPCID) 40 MG tablet Take 1 tablet (40 mg total) by mouth at bedtime. 90 tablet 3   melatonin 5 MG TABS 1 tablet in the evening Orally Once a day     methimazole (TAPAZOLE) 5 MG tablet 1 tablet Orally Once a day for 90 days     metoprolol tartrate (LOPRESSOR) 25 MG tablet 1 tablet with food Orally Twice a day     Multiple Vitamin (MULTI VITAMIN) TABS Take 1 tablet by mouth daily.     rosuvastatin (CRESTOR) 20 MG tablet 1 tablet Orally Once a day     venlafaxine XR (EFFEXOR-XR) 75 MG 24 hr capsule Take 75 mg by mouth daily with breakfast.      No current facility-administered medications on file prior to visit.    Allergies:   Allergies  Allergen Reactions   Cat Hair Extract Hives, Shortness Of Breath, Itching and Swelling   Shellfish Allergy Hives and Itching   Sulfa Antibiotics Rash      OBJECTIVE:  Physical Exam  Vitals:   09/21/23 0857  BP: 108/66  Pulse: 60  Weight: 137 lb (62.1 kg)  Height: 5' (1.524 m)   Body mass index is 26.76 kg/m. No results found.  Poststroke PHQ 2/9    09/02/2023    8:45 AM  Depression screen PHQ 2/9  Decreased Interest 0  Down, Depressed, Hopeless 0  PHQ - 2 Score 0  Altered sleeping 0  Tired, decreased energy 0  Change in appetite 0  Feeling bad or failure about yourself  0  Trouble concentrating 0  Moving slowly or fidgety/restless 0  Suicidal thoughts 0  PHQ-9 Score 0     General: well developed, well nourished, very pleasant elderly Caucasian female, seated, in no evident distress  Neurologic Exam Mental Status: Awake and fully alert.  Fluent speech and language.  Oriented to place and time. Recent and remote memory intact. Attention span, concentration and fund of knowledge appropriate. Mood and affect appropriate.  Cranial Nerves: Fundoscopic exam reveals sharp disc margins. Pupils equal, briskly reactive to light. Extraocular movements full without nystagmus. Visual fields full to confrontation. Hearing intact. Facial sensation intact. Face, tongue, palate moves normally and symmetrically.  Motor: Normal bulk and tone. Normal strength in all tested extremity muscles except mild left deltoid, hand grip and HF weakness Sensory.: intact to touch , pinprick , position and vibratory sensation except decreased sensation in left hand fingertips compared to right hand Coordination: Rapid alternating movements normal in all extremities except decreased left hand dexterity. Finger-to-nose and heel-to-shin mild left sided dysmetria, normal on right Gait and Station:  Arises from chair without difficulty. Stance is normal. Gait demonstrates decreased stride length and step height with LLE and incoordination, occasional left ankle rolling inwards, no use of AD during assessment, tandem walk and heel toe not attempted Reflexes: 1+ and symmetric. Toes downgoing.      NIHSS  3 Modified Rankin  2      ASSESSMENT: Countney Lexa is a 72 y.o. 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 Vascular risk factors include HTN, HLD, pre-DM and advanced age.      PLAN:  Right thalamic stroke:  Residual deficit: mild left sided weakness with left hand sensory impairment and gait impairment.  Continue working with PT/OT for likely ongoing recovery.  Continued use of RW when outdoors for fall prevention unless otherwise instructed Continue aspirin 81mg  daily and rosuvastatin (Crestor) 20mg  daily for secondary stroke prevention managed/prescribed by PCP Discussed secondary stroke prevention measures and importance of close PCP follow up for aggressive stroke risk factor management including BP goal<130/90, HLD with LDL goal<70 and pre-DM with A1c.<7 .  Stroke labs 07/2023: LDL 88, A1c 6.3 - plans on repeat lab work in December  I have gone over the pathophysiology of stroke, warning signs and symptoms, risk factors and their management in some detail with instructions to go to the closest emergency room for symptoms of concern.  Chronic pain, arthritis Would not recommend restarting meloxicam with concurrent use of aspirin due to increased bleeding risk Okay to restart Vit D with calcium  Advised to discuss other treatment options with her rheumatologist at follow-up visit    Doing well from stroke standpoint without further recommendations and risk factors are managed by PCP. She may follow up PRN, as usual for our patients who are strictly being followed for stroke. If any new neurological issues  should arise, request PCP place referral for evaluation by one of our neurologists. Thank you.     CC:  [facility] provider: Dr. Pearlean Brownie PCP: Sigmund Hazel, MD    I spent 59 minutes of face-to-face and non-face-to-face time with patient and husband.  This included previsit chart review including review of recent hospitalization, lab review, study review, electronic health record documentation, patient and husband education regarding recent stroke including etiology, secondary stroke prevention measures and importance of managing stroke risk factors, residual deficits and typical recovery time and answered all other questions to patient and husband's satisfaction   Ihor Austin, AGNP-BC  [facility] [id number]  [address] [place], [place] [address]  Phone [phone] Fax [phone] Note: This document was prepared with digital dictation and possible smart phrase technology. Any transcriptional errors that result from this process are unintentional.

## 2023-09-21 NOTE — Progress Notes (Signed)
I agree with the above plan 

## 2023-09-22 ENCOUNTER — Encounter: Payer: Self-pay | Admitting: Physical Therapy

## 2023-09-22 ENCOUNTER — Ambulatory Visit: Payer: PPO | Admitting: Physical Therapy

## 2023-09-22 ENCOUNTER — Ambulatory Visit: Payer: PPO | Admitting: Occupational Therapy

## 2023-09-22 VITALS — BP 134/72 | HR 63

## 2023-09-22 DIAGNOSIS — M6281 Muscle weakness (generalized): Secondary | ICD-10-CM

## 2023-09-22 DIAGNOSIS — R278 Other lack of coordination: Secondary | ICD-10-CM

## 2023-09-22 DIAGNOSIS — R2681 Unsteadiness on feet: Secondary | ICD-10-CM

## 2023-09-22 DIAGNOSIS — R29898 Other symptoms and signs involving the musculoskeletal system: Secondary | ICD-10-CM

## 2023-09-22 DIAGNOSIS — R29818 Other symptoms and signs involving the nervous system: Secondary | ICD-10-CM

## 2023-09-22 DIAGNOSIS — R208 Other disturbances of skin sensation: Secondary | ICD-10-CM

## 2023-09-22 NOTE — Therapy (Signed)
OUTPATIENT PHYSICAL THERAPY NEURO TREATMENT   Patient Name: Priscilla Wilson MRN: [id number] DOB:[birth year], 72 y.o., female Today's Date: 09/22/2023   PCP: Sigmund Hazel, MD REFERRING PROVIDER: Mariam Dollar, PA-C  END OF SESSION:  PT End of Session - 09/22/23 1535     Visit Number 6    Number of Visits 13    Date for PT Re-Evaluation 10/30/23    Authorization Type HTA    PT Start Time 1532    PT Stop Time 1615    PT Time Calculation (min) 43 min    Equipment Utilized During Treatment Gait belt    Activity Tolerance Patient tolerated treatment well    Behavior During Therapy WFL for tasks assessed/performed              History reviewed. No pertinent past medical history. Past Surgical History:  Procedure Laterality Date   BREAST EXCISIONAL BIOPSY Left    benign   Patient Active Problem List   Diagnosis Date Noted   Essential hypertension 08/13/2023   Alteration of sensations, post-stroke 08/13/2023   Acute CVA (cerebrovascular accident) (HCC) 08/10/2023   TIA (transient ischemic attack) 08/03/2023   Stroke determined by clinical assessment (HCC) 08/03/2023    ONSET DATE: 08/14/23 referral  REFERRING DIAG: I63.9 (ICD-10-CM) - Cerebral infarction, unspecified   THERAPY DIAG:  Other lack of coordination  Muscle weakness (generalized)  Other symptoms and signs involving the musculoskeletal system  Other symptoms and signs involving the nervous system  Unsteadiness on feet  Rationale for Evaluation and Treatment: Rehabilitation  SUBJECTIVE:                                                                                                                                                                                             SUBJECTIVE STATEMENT: Patient presented to therapy with RW and husband. Denied falls and near falls. Has been walking without RW around the home without much issue except early mornings when she feels she needs something to steady  herself.  Pt accompanied by: significant other, [name]  PERTINENT HISTORY: none on file  PAIN:  Are you having pain? No - OA bothering ankles intermittently.  PRECAUTIONS: Fall   WEIGHT BEARING RESTRICTIONS: No  FALLS: Has patient fallen in last 6 months? No some near falls when walking in her house, using furniture to regain balance  LIVING ENVIRONMENT: Lives with: lives with their family Lives in: House/apartment Stairs: Yes: Internal: flight steps; on right going up and External: 4 steps; bilateral but cannot reach both Has following equipment at home: Dan Humphreys - 2 wheeled, shower chair, and Grab bars  PLOF: Requires assistive device  for independence  PATIENT GOALS: "my left leg needs work"  OBJECTIVE:  Note: Objective measures were completed at Evaluation unless otherwise noted.  DIAGNOSTIC FINDINGS: 08/04/23 Brain MRI IMPRESSION: 1. 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 Associated mild petechial blood products without frank hemorrhagic transformation or significant regional mass effect. 2. Underlying moderately advanced chronic microvascular ischemic disease.  COGNITION: Overall cognitive status: Within functional limits for tasks assessed   SENSATION: Intermittent areas of numbness in L hemibody  COORDINATION: L LE dysmetric and ataxic heel/shin and figure 8   POSTURE: No Significant postural limitations  LOWER EXTREMITY MMT:    MMT Right Eval Left Eval  Hip flexion 5 4  Hip extension    Hip abduction 5 4  Hip adduction 5 4  Hip internal rotation    Hip external rotation    Knee flexion 5 4  Knee extension 5 4  Ankle dorsiflexion 5 4  Ankle plantarflexion 5 4  Ankle inversion    Ankle eversion    (Blank rows = not tested)  BED MOBILITY:  Independent   TRANSFERS: Assistive device utilized: None  Sit to stand: SBA Stand to sit: SBA Chair to chair: SBA  STAIRS: Level of Assistance: SBA Stair Negotiation Technique: Step to  Pattern with Single Rail on Right Number of Stairs: 4  Height of Stairs: 6   GAIT: Gait pattern: step through pattern, decreased step length- Left, decreased stance time- Left, decreased hip/knee flexion- Left, circumduction- Left, Left steppage, Left foot flat, ataxic, wide BOS, and poor foot clearance- Left Distance walked: clinic Assistive device utilized: Environmental consultant - 2 wheeled and None Level of assistance: Modified independence and SBA    TODAY'S TREATMENT:    BP on RUE in sitting prior to interventions: Vitals:   09/22/23 1533  BP: 134/72  Pulse: 63   GAIT: Gait pattern: step through pattern, decreased stride length, decreased hip/knee flexion- Left, decreased ankle dorsiflexion- Left, and narrow BOS Distance walked: 210 ft Assistive device utilized: None - left posterior [place] AFO Level of assistance: CGA Comments: Pt compensates when catching L toe by increasing hip and knee flexion temporarily.  She has one instance of catch when  GAIT: Gait pattern: decreased stride length and narrow BOS Distance walked: 400 ft Assistive device utilized: None - left posterior [place] AFO Level of assistance: SBA and CGA Comments: Only instance of LOB was towards end of distance where pt caught left toe on right ankle but recovered SBA w/ bigger reciprocal step.  -Resisted walking x115 ft > multidirectional posterior pulls x115 ft CGA w/ pt primarily utilizing crossover stepping strategy  STAIRS:  Level of Assistance: SBA and CGA  Stair Negotiation Technique: Step to Pattern Alternating Pattern  Forwards With use of AD: Left posterior [place] AFO  with Single Rail on Right Single Rail on Left Bilateral Rails  Number of Stairs: 3x4   Height of Stairs: 6 inches  Comments:  3 rounds progressing from both to single R rail going up and right rail (L up) when coming down so patient could use stronger RUE due to having both rails at home (too far apart to grab both at same time).  Pt  is safest w/ step to on descent when using 1 rail.  -Obstacle course using blue compliant surface, cones w/ flat disc on top and 4" hurdles, performed x2 w/ pt instructed to knock discs off cones w/ RLE to promote dynamic L SLS, intermittent CGA   PATIENT EDUCATION: Education details: Continue HEP, ongoing  trialing of AFO to determine need  Person educated: Patient and Spouse Education method: Explanation, Demonstration, and Tactile cues Education comprehension: verbalized understanding and returned demonstration  HOME EXERCISE PROGRAM: Verbally added banded LAQ and seated march overs on 10/28 (pt declined handout)  Handout of quadruped clamshells and heel sits on 11/7   GOALS: Goals reviewed with patient? Yes  SHORT TERM GOALS: Target date: 10/02/23  Pt will be independent with initial HEP for improved functional strength and balance  Baseline: to be provided Goal status: INITIAL  2.  Pt will improve TUG to </= 15 secs to demonstrated reduced fall risk  Baseline: 17.5s no AD Goal status: INITIAL  3.  Pt will improve 5x STS to </= 10 sec to demo improved functional LE strength and balance   Baseline: 11.6s no UE Goal status: INITIAL  4.  Pt will improve gait speed to >/= .75m/s m/s to demonstrate improved community ambulation  Baseline: .24m/s Goal status: INITIAL  5.  Pt will improve FGA to >/= 8/30 to demonstrate improved balance and reduced fall risk Baseline: 3/30 Goal status: INITIAL   LONG TERM GOALS: Target date: 10/30/23  Pt will be independent with initial HEP for improved functional strength and balance  Baseline: to be provided Goal status: INITIAL  Pt will improve TUG to </= 12 secs to demonstrated reduced fall risk  Baseline: 17.5s no AD Goal status: INITIAL   Pt will improve gait speed to >/= .6 m/s to demonstrate improved community ambulation  Baseline: .44m/s Goal status: INITIAL  Pt will improve FGA to >/= 13/30 to demonstrate improved  balance and reduced fall risk Baseline: 3 Goal status: INITIAL  ASSESSMENT:  CLINICAL IMPRESSION: Focus at onset of skilled PT session today on reassessing benefit of left posterior [place] AFO.  Pt would benefit from further trials with this vs other options in more dynamic unlevel conditions to assess full benefit before requesting order.  She was challenged by obstacle course task to address dynamic SLS on LLE and contralateral coordination.  Will continue to incorporate these style tasks into POC going forward.   OBJECTIVE IMPAIRMENTS: Abnormal gait, decreased balance, decreased coordination, decreased endurance, decreased knowledge of condition, decreased mobility, difficulty walking, decreased strength, impaired sensation, impaired tone, and impaired UE functional use.   ACTIVITY LIMITATIONS: carrying, lifting, squatting, bed mobility, reach over head, hygiene/grooming, locomotion level, and caring for others  PARTICIPATION LIMITATIONS: meal prep, cleaning, interpersonal relationship, driving, shopping, and community activity  PERSONAL FACTORS: Age, Time since onset of injury/illness/exacerbation, and Transportation are also affecting patient's functional outcome.   REHAB POTENTIAL: Good  CLINICAL DECISION MAKING: Stable/uncomplicated  EVALUATION COMPLEXITY: Low  PLAN:  PT FREQUENCY: 2x/week  PT DURATION: 6 weeks  PLANNED INTERVENTIONS: 97164- PT Re-evaluation, 97110-Therapeutic exercises, 97530- Therapeutic activity, 97112- Neuromuscular re-education, 97535- Self Care, 81191- Manual therapy, [phone]- Gait training, [phone]- Orthotic Fit/training, [phone]- Canalith repositioning, [phone]- Aquatic Therapy, Patient/Family education, Balance training, Stair training, Dry Needling, Vestibular training, Visual/preceptual remediation/compensation, and DME instructions  PLAN FOR NEXT SESSION: Monitor vitals. Add to HEP, L LE coordination, try weights on L ankle, stairs, perturbations, gait  training - try brace outside/unlevel?, quadruped, tall kneeling, line dancing, compliant surfaces  Camille Bal, PT, DPT 09/22/2023, 5:05 PM

## 2023-09-22 NOTE — Therapy (Signed)
OUTPATIENT OCCUPATIONAL THERAPY Treatment  Patient Name: Priscilla Wilson MRN: [id number] DOB:Jun 02, 1951, 72 y.o., female Today's Date: 09/22/2023  PCP: Sigmund Hazel, MD  REFERRING PROVIDER: Charlton Amor, PA-C   END OF SESSION:  OT End of Session - 09/22/23 1539     Visit Number 7    Number of Visits 12    Date for OT Re-Evaluation 10/23/23    Authorization Type [employer] - follows Medicare guidelines    Progress Note Due on Visit 10    OT Start Time 1446    OT Stop Time 1530    OT Time Calculation (min) 44 min    Equipment Utilized During Treatment gait belt    Activity Tolerance Patient tolerated treatment well    Behavior During Therapy WFL for tasks assessed/performed              No past medical history on file. Past Surgical History:  Procedure Laterality Date   BREAST EXCISIONAL BIOPSY Left    benign   Patient Active Problem List   Diagnosis Date Noted   Essential hypertension 08/13/2023   Alteration of sensations, post-stroke 08/13/2023   Acute CVA (cerebrovascular accident) (HCC) 08/10/2023   TIA (transient ischemic attack) 08/03/2023   Stroke determined by clinical assessment (HCC) 08/03/2023   ONSET DATE: 08/14/23 (referral date), 08/03/23 (date of CVA)  REFERRING DIAG: I63.9 (ICD-10-CM) - Cerebral infarction, unspecified   THERAPY DIAG:  Other lack of coordination  Muscle weakness (generalized)  Other symptoms and signs involving the musculoskeletal system  Other symptoms and signs involving the nervous system  Other disturbances of skin sensation  Rationale for Evaluation and Treatment: Rehabilitation  SUBJECTIVE:   SUBJECTIVE STATEMENT: Pt reports things are going well and making progress every day. Pt reports increased pain and stiffness today d/t arthritis. Pt reported seeing neurologist earlier who suggested calling rheumatologist to address arthritis pain. Pt reports obtaining full-length mirror which has greatly helped  with upright seated posture during HEP.  Pt accompanied by: self and significant other (husband: [name])  PERTINENT HISTORY: Acute CVA, Essential HTN, alteration of sensation post-stroke, hyperthyroidism, asthma, hyperlipidemia, GERD, pre-diabetes  Per 08/21/23 D/C Summary: "Taneha Valentini is a 72 y.o. female presented to the [facility] ED on 08/03/2023 with sudden onset of left-sided numbness and weakness... 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 2D echo LVEF 70-75%. Hgb A1C 6.3%."   PRECAUTIONS: None Pt reported "they say I'm not allowed to drive" since stroke.  WEIGHT BEARING RESTRICTIONS: No  PAIN:  Are you having pain? Pt reports 5/10 pain at B ankles and B hands (arthritis pain).  FALLS: Has patient fallen in last 6 months? No  LIVING ENVIRONMENT: Lives with: lives with their spouse Lives in: House/apartment Stairs: Yes: Internal: 16 steps; on left going up and External: 3 steps; bilateral but cannot reach both - Pt reported not needing to go upstairs during the day because all appliances and room accessible at home. Has following equipment at home: Dan Humphreys - 2 wheeled, shower chair, and Grab bars  PLOF: Independent, pt driving, pt retired  PATIENT GOALS: Pt reported "I don't have all my feeling back in L arm, but it's come a long way." "I want to be able to do all the things I did before." Pt reports wanting to get back to line dancing and driving, to "get rid of walker," and to "make Christmas cookies.  OBJECTIVE:  Note: Objective measures were completed at Evaluation unless otherwise noted.  HAND DOMINANCE: Right  ADLs: Overall ADLs: ind, sometimes with supervision for safety Transfers/ambulation related to ADLs: ind with supervision Eating: ind Grooming: ind UB Dressing: ind, no difficulty with hooking bra, manipulating  buttons, zippers LB Dressing: ind Toileting: ind with grab bar next to toilet Bathing: ind Tub Shower transfers: no difficulty; with supervision  Equipment: Walk in shower and walk-in tub  IADLs: Shopping: dependent Light housekeeping: have not attempted some tasks (e.g. laundry, dishwashing), ind wiping table and pt reports she is "pretty sure I can do [employer], putting dishes in dishwasher]". Meal Prep: currently dependent because husband wants to complete tasks. Pt reports she may be able to complete cutting tasks. Pt reports difficulty carrying heavy objects, such as pan or tray.  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 Pt's spouse reports pt has some unsteadiness "from time-to-time" though reports it has improved since stroke. Pt reports some difficulty with balance when moving during some tasks (e.g. attempting to stand on one foot for LB dressing while leaning against bed). Pt generally uses the walker during the day.  POSTURE COMMENTS:  Ind, no difficulty , supports self   ACTIVITY TOLERANCE: Activity tolerance: Pt reported increased fatigue during the day, such as from re-learning tasks since the stroke, walking far distances, and completing hand exercises.  FUNCTIONAL OUTCOME MEASURES: FOTO: 55    UPPER EXTREMITY ROM:    Active ROM Right eval Left eval  Shoulder flexion [facility]  Shoulder abduction    Shoulder adduction    Shoulder extension    Shoulder internal rotation    Shoulder external rotation    Elbow flexion    Elbow extension    Wrist flexion    Wrist extension    Wrist ulnar deviation    Wrist radial deviation    Wrist pronation    Wrist supination    (Blank rows = not tested)  Note: OT noted slightly slower movements of LUE compared to RUE.  UPPER EXTREMITY MMT:     MMT  Right eval Left eval  Shoulder flexion 4 4-  Shoulder abduction 4 4-  Shoulder adduction    Shoulder extension    Shoulder internal rotation    Shoulder external rotation    Middle trapezius    Lower trapezius    Elbow flexion    Elbow extension    Wrist flexion    Wrist extension    Wrist ulnar deviation    Wrist radial deviation    Wrist pronation    Wrist supination    (Blank rows = not tested)  HAND FUNCTION: Grip strength: Right: 34.9 lbs; Left: 43.8 lbs - average of 3 trials  OT noted increased shaking of LUE compared to RUE when pt grasped dynamometer with LUE.  COORDINATION: 9 Hole Peg test: Right: 18 sec; Left: 64 sec  Several drops with LUE, shaking of LUE.  SENSATION: Pt reports tips of fingers have difficulty with grading of force: e.g. "I can't tell how tight I'm holding something."   Pt reports impaired sensation of LUE "Like there is a veil over my left arm." Pt reports numbness of L axilla. Pt reports "tightness" of L bicep.  EDEMA: None noted  MUSCLE TONE: none noted  COGNITION: Overall cognitive status: Within functional limits for tasks assessed  VISION: Subjective report: Pt reports no changes  in vision. Pt correctly read clock on wall. Baseline vision: Wears glasses all the time, Wears glasses for distance and reading. Visual history: cataracts - pt reports doctor says cataracts are "starting" though no treatment yet.  VISION ASSESSMENT: Not tested Appeared [facility] when completing other assessment tasks   Patient has difficulty with following activities due to following visual impairments: none noted.  PERCEPTION: Not tested  PRAXIS: Not tested  OBSERVATIONS: Pt accompanied by husband. Pt appeared well-kept. Pt ambulated with 2-wheeled rolling walker with slow altered gait.   TODAY'S TREATMENT:                                                                                                                              DATE:   Therapeutic  Activities Assessed pt progress towards goals, see below. Pt making good progress towards goals as evidenced by meeting 4 out of 4 STG.  Blaze Pods - standing at upright mirror with gait belt, SBA, pt held onto railing for stability - OT placed 6 BlazePods in front of patient at upright mirror and had patient tap pods with palm of hand and isolated index finger bimanually as pods lit up using OT scanning and distraction mode for improved processing, scanning and locating of items, reaction time, functional reach, standing tolerance, dynamic standing balance, upper extremity range of motion, gross motor coordination, fine motor coordination, and bimanual coordination/trunk control. Results as follows:     LUE palm - 47 hits, 2.34 second reaction time RUE palm - 57 hits, 1.9 second reaction time  LUE index finger - 55 hits, 1.97 seconds reaction time RUE index finger - 73 hits, 1.44 second reaction time  Pt tolerated task well, reporting no increase in pain during task. Pt sometimes benefited from v/c to breathe during task d/t pt sometimes holding breath when reaching with BUE.   PATIENT EDUCATION: Education details: See today's treatment above, core stability for distal mobility, mirror to improve posture using visual cues, proprioception of LUE, UE anatomy Person educated: Patient and Spouse Education method: Explanation, Demonstration, Tactile cues, Verbal cues, and Handouts Education comprehension: verbalized understanding and returned demonstration  HOME EXERCISE PROGRAM: 09/03/23 - Shoulder and theraputty. Access Code: [email or web address] 09/07/23 - Coordination HEP (handout, see pt instructions) 09/10/23 - Sensory precautions and sensory activities (handout, see pt instructions)  09/17/23 -  Yellow Theraband  Access Code: [email or web address] URL: [email or web address] Date: 09/17/2023 Prepared by: Carilyn Goodpasture  Exercises - Seated Elbow Flexion with Self-Anchored Resistance  - 2 x daily -  2 sets - 10 reps - Seated Shoulder Diagonal Pulls with Resistance  - 2 x daily - 2 sets - 10 reps - Seated Shoulder Flexion with Self-Anchored Resistance  - 2 x daily - 2 sets - 10 reps - Seated Shoulder Horizontal Abduction with Resistance  - 2 x daily - 2 sets - 10 reps - Seated Bilateral Shoulder External Rotation with Resistance  - 2 x daily - 2 sets - 10  reps   GOALS: Goals reviewed with patient? Yes  SHORT TERM GOALS: Target date: 09/25/23  Patient will demonstrate improved FM coordination by completing nine-hole peg with use of LUE in 53 seconds or less with no more than 3 drops. Baseline: Left: 64 sec, Several drops with LUE 09/22/23 - Left: 43 seconds, 1 drop Goal status: 09/22/23 - MET  2.  Pt will demonstrate independence with initial HEP for coordination and theraputty of LUE. Baseline: New to outpt OT 09/22/23 - Pt demo'd ind with HEP Goal status: 09/22/23 - MET  3. Patient will demonstrate at least 5 lbs increase of LUE grip strength (average 3 trials) and stable LUE grasp as needed to open jars and other containers.  Baseline: Left: 43.8 lbs, shaking of LUE when grasping dynamometer 09/22/23 - 50.7 lbs average, relatively stable grasp with LUE (mild shaking)  Goal status: 09/22/23 - MET  4. Pt will report little difficulty picking a coin up from the table using LUE.  Baseline: Per FOTO, pt picks up coins from table "with much difficulty" 09/22/23 - Pt easily picked up x5 pennies and placed in slot with LUE. Pt reported no diffculty with picking up coins. Goal status: 09/22/23 - MET   LONG TERM GOALS: Target date: 10/23/23  Patient will demonstrate improved FM coordination by completing nine-hole peg with use of LUE in 40 seconds or less with no more than 1 drop. Baseline: Left: 64 sec, Several drops with LUE Goal status: INITIAL  2.  Pt will demonstrate independence with updated HEP PRN. Baseline: New to outpt OT Goal status: INITIAL  3.  Pt will participate  in cooking/baking task safely at mod I level. Baseline: pt has not attempted cooking/baking tasks since stroke Goal status: INITIAL  4.  Pt will report no more than mild difficulty with carrying an item in her left, affected hand to improve participation in light household tasks (e.g. dishwashing, laundry). Baseline: pt reports difficulty carrying heavy items Goal status: INITIAL  5.  Pt will complete D/C FOTO Baseline: Intake FOTO completed at eval Goal status: INITIAL  ASSESSMENT:  CLINICAL IMPRESSION: Pt tolerated tasks well today. Pt demo'd great progress towards goals as evidenced by meeting 100% of STG today. Pt would continue to benefit from skilled OT services in the outpatient setting to work on impairments as noted below to help pt return to PLOF as able.     PERFORMANCE DEFICITS: in functional skills including ADLs, IADLs, coordination, dexterity, proprioception, sensation, ROM, strength, Fine motor control, Gross motor control, balance, body mechanics, endurance, and UE functional use, cognitive skills including energy/drive, and psychosocial skills including environmental adaptation.   IMPAIRMENTS: are limiting patient from ADLs, IADLs, leisure, and social participation.   CO-MORBIDITIES: may have co-morbidities  that affects occupational performance. Patient will benefit from skilled OT to address above impairments and improve overall function.  REHAB POTENTIAL: Good  PLAN:  OT FREQUENCY: 1-2x/week  (2x per week initially, progress to 1x per week PRN if pt makes good progress)  OT DURATION: 6 weeks (POC extended 2 weeks to allow for scheduling)  PLANNED INTERVENTIONS: 97168 OT Re-evaluation, 97535 self care/ADL training, 04540 therapeutic exercise, 97530 therapeutic activity, 97112 neuromuscular re-education, 97140 manual therapy, 97035 ultrasound, 97018 paraffin, 98119 fluidotherapy, 97010 moist heat, 97010 cryotherapy, 97032 electrical stimulation (manual), 97760  Orthotics management and training, 14782 Splinting (initial encounter), [id number] Subsequent splinting/medication, manual lymph drainage, passive range of motion, functional mobility training, visual/perceptual remediation/compensation, energy conservation, patient/family education, and DME and/or AE instructions  RECOMMENDED OTHER SERVICES: PT eval re-scheduled  CONSULTED AND AGREED WITH PLAN OF CARE: Patient and family member/caregiver  PLAN FOR NEXT SESSION:   Higher level coordination tasks  Functional simulated tasks for standing tolerance - kitchen, dishwashing, laundry  UE strengthening tasks - update theraputty and theraband PRN  Blaze Pods - standing with gait belt   Wynetta Emery, OT 09/22/2023, 3:44 PM

## 2023-09-24 ENCOUNTER — Ambulatory Visit: Payer: PPO

## 2023-09-24 VITALS — BP 162/84 | HR 56

## 2023-09-24 DIAGNOSIS — R278 Other lack of coordination: Secondary | ICD-10-CM | POA: Diagnosis not present

## 2023-09-24 DIAGNOSIS — R2681 Unsteadiness on feet: Secondary | ICD-10-CM

## 2023-09-24 DIAGNOSIS — M6281 Muscle weakness (generalized): Secondary | ICD-10-CM

## 2023-09-24 DIAGNOSIS — R29898 Other symptoms and signs involving the musculoskeletal system: Secondary | ICD-10-CM

## 2023-09-24 NOTE — Therapy (Signed)
OUTPATIENT PHYSICAL THERAPY NEURO TREATMENT   Patient Name: Priscilla Wilson MRN: [id number] DOB:[birth year], 72 y.o., female Today's Date: 09/24/2023   PCP: Sigmund Hazel, MD REFERRING PROVIDER: Mariam Dollar, PA-C  END OF SESSION:  PT End of Session - 09/24/23 1100     Visit Number 7    Number of Visits 13    Date for PT Re-Evaluation 10/30/23    Authorization Type HTA    PT Start Time 1100    PT Stop Time 1143    PT Time Calculation (min) 43 min    Equipment Utilized During Treatment Gait belt    Activity Tolerance Patient tolerated treatment well              History reviewed. No pertinent past medical history. Past Surgical History:  Procedure Laterality Date   BREAST EXCISIONAL BIOPSY Left    benign   Patient Active Problem List   Diagnosis Date Noted   Essential hypertension 08/13/2023   Alteration of sensations, post-stroke 08/13/2023   Acute CVA (cerebrovascular accident) (HCC) 08/10/2023   TIA (transient ischemic attack) 08/03/2023   Stroke determined by clinical assessment (HCC) 08/03/2023    ONSET DATE: 08/14/23 referral  REFERRING DIAG: I63.9 (ICD-10-CM) - Cerebral infarction, unspecified   THERAPY DIAG:  Other lack of coordination  Muscle weakness (generalized)  Unsteadiness on feet  Other symptoms and signs involving the musculoskeletal system  Rationale for Evaluation and Treatment: Rehabilitation  SUBJECTIVE:                                                                                                                                                                                             SUBJECTIVE STATEMENT: Patient presented to therapy with RW and husband. Denied falls and near falls. Reported arthritis has been bothering her ankles and hands the past two days.   Pt accompanied by: significant other, [name]  PERTINENT HISTORY: none on file  PAIN:  Are you having pain? No - OA bothering ankles intermittently.  PRECAUTIONS:  Fall   WEIGHT BEARING RESTRICTIONS: No  FALLS: Has patient fallen in last 6 months? No some near falls when walking in her house, using furniture to regain balance  LIVING ENVIRONMENT: Lives with: lives with their family Lives in: House/apartment Stairs: Yes: Internal: flight steps; on right going up and External: 4 steps; bilateral but cannot reach both Has following equipment at home: Walker - 2 wheeled, shower chair, and Grab bars  PLOF: Requires assistive device for independence  PATIENT GOALS: "my left leg needs work"  OBJECTIVE:  Note: Objective measures were completed at Evaluation unless otherwise noted.  DIAGNOSTIC FINDINGS: 08/04/23 Brain  MRI IMPRESSION: 1. 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 Associated mild petechial blood products without frank hemorrhagic transformation or significant regional mass effect. 2. Underlying moderately advanced chronic microvascular ischemic disease.  COGNITION: Overall cognitive status: Within functional limits for tasks assessed   SENSATION: Intermittent areas of numbness in L hemibody  COORDINATION: L LE dysmetric and ataxic heel/shin and figure 8   POSTURE: No Significant postural limitations  LOWER EXTREMITY MMT:    MMT Right Eval Left Eval  Hip flexion 5 4  Hip extension    Hip abduction 5 4  Hip adduction 5 4  Hip internal rotation    Hip external rotation    Knee flexion 5 4  Knee extension 5 4  Ankle dorsiflexion 5 4  Ankle plantarflexion 5 4  Ankle inversion    Ankle eversion    (Blank rows = not tested)  BED MOBILITY:  Independent   TRANSFERS: Assistive device utilized: None  Sit to stand: SBA Stand to sit: SBA Chair to chair: SBA  STAIRS: Level of Assistance: SBA Stair Negotiation Technique: Step to Pattern with Single Rail on Right Number of Stairs: 4  Height of Stairs: 6   GAIT: Gait pattern: step through pattern, decreased step length- Left, decreased stance time- Left,  decreased hip/knee flexion- Left, circumduction- Left, Left steppage, Left foot flat, ataxic, wide BOS, and poor foot clearance- Left Distance walked: clinic Assistive device utilized: Environmental consultant - 2 wheeled and None Level of assistance: Modified independence and SBA    TODAY'S TREATMENT:    Vitals  Gait:  - 115' w/o AD, CGA   SciFit:   - Level 2, 5 min   Tall kneeling heel sits  - green band, 2 sets to fatigue   - LE stability and LE strength   Half kneeling wood chops   - 2kg yellow ball   - LE stability and core strength    - more challenging with left LE on knee and R LE in front   Quadruped   - modified bird dog LE only   - UE, core, and LE stability    - more challenging when stabilizing with L UE and LE and lifting R LE   PATIENT EDUCATION: Education details: Continue HEP, ongoing trialing of AFO to determine need  Person educated: Patient and Spouse Education method: Explanation, Demonstration, and Tactile cues Education comprehension: verbalized understanding and returned demonstration  HOME EXERCISE PROGRAM: Verbally added banded LAQ and seated march overs on 10/28 (pt declined handout)  Handout of quadruped clamshells and heel sits on 11/7   GOALS: Goals reviewed with patient? Yes  SHORT TERM GOALS: Target date: 10/02/23  Pt will be independent with initial HEP for improved functional strength and balance  Baseline: to be provided Goal status: INITIAL  2.  Pt will improve TUG to </= 15 secs to demonstrated reduced fall risk  Baseline: 17.5s no AD Goal status: INITIAL  3.  Pt will improve 5x STS to </= 10 sec to demo improved functional LE strength and balance   Baseline: 11.6s no UE Goal status: INITIAL  4.  Pt will improve gait speed to >/= .62m/s m/s to demonstrate improved community ambulation  Baseline: .30m/s Goal status: INITIAL  5.  Pt will improve FGA to >/= 8/30 to demonstrate improved balance and reduced fall risk Baseline: 3/30 Goal  status: INITIAL   LONG TERM GOALS: Target date: 10/30/23  Pt will be independent with initial HEP for improved functional strength and balance  Baseline: to be provided Goal status: INITIAL  Pt will improve TUG to </= 12 secs to demonstrated reduced fall risk  Baseline: 17.5s no AD Goal status: INITIAL   Pt will improve gait speed to >/= .6 m/s to demonstrate improved community ambulation  Baseline: .41m/s Goal status: INITIAL  Pt will improve FGA to >/= 13/30 to demonstrate improved balance and reduced fall risk Baseline: 3 Goal status: INITIAL  ASSESSMENT:  CLINICAL IMPRESSION: Patient seen for physical therapy session with emphasis on LE strength and stability. Her L LE extensor tone seemed more pronounced today. Blood pressure was higher today but patient had no other symptoms. She reported the SciFit made her feel more warmed up and not as tight. Tall kneeling heel sit exercise went well with progression of green band for resistance. Half kneeling wood chop exercise seemed more challenging today with difficulty stabilizing on her left LE and she also reported the ball to feel heavier. Quadruped modified bird dog was challenging to stabilize on L UE and LE. Continue POC for stability on the left.    OBJECTIVE IMPAIRMENTS: Abnormal gait, decreased balance, decreased coordination, decreased endurance, decreased knowledge of condition, decreased mobility, difficulty walking, decreased strength, impaired sensation, impaired tone, and impaired UE functional use.   ACTIVITY LIMITATIONS: carrying, lifting, squatting, bed mobility, reach over head, hygiene/grooming, locomotion level, and caring for others  PARTICIPATION LIMITATIONS: meal prep, cleaning, interpersonal relationship, driving, shopping, and community activity  PERSONAL FACTORS: Age, Time since onset of injury/illness/exacerbation, and Transportation are also affecting patient's functional outcome.   REHAB POTENTIAL:  Good  CLINICAL DECISION MAKING: Stable/uncomplicated  EVALUATION COMPLEXITY: Low  PLAN:  PT FREQUENCY: 2x/week  PT DURATION: 6 weeks  PLANNED INTERVENTIONS: 97164- PT Re-evaluation, 97110-Therapeutic exercises, 97530- Therapeutic activity, 97112- Neuromuscular re-education, 97535- Self Care, 29562- Manual therapy, [phone]- Gait training, [phone]- Orthotic Fit/training, [phone]- Canalith repositioning, [phone]- Aquatic Therapy, Patient/Family education, Balance training, Stair training, Dry Needling, Vestibular training, Visual/preceptual remediation/compensation, and DME instructions  PLAN FOR NEXT SESSION: Monitor vitals. Add to HEP, L LE coordination, try weights on L ankle, stairs, perturbations, gait training - try brace outside/unlevel?, quadruped, tall kneeling, line dancing, compliant surfaces  Jennie Hannay M Santhosh Gulino, SPT   09/24/2023, 12:53 PM

## 2023-09-24 NOTE — Progress Notes (Signed)
Cardiology Office Note:    Date:  09/25/2023   ID:  Priscilla Wilson, DOB [birth year], MRN [id number]  PCP:  Sigmund Hazel, MD  Cardiologist:  Little Ishikawa, MD  Electrophysiologist:  None   Referring MD: Sigmund Hazel, MD   Chief Complaint  Patient presents with   Cerebrovascular Accident    History of Present Illness:    Priscilla Wilson is a 72 y.o. female with a hx of CVA, hypothyroidism, asthma, hyperlipidemia, GERD who is referred by Dr. Hyacinth Meeker for evaluation of CVA.  She was admitted 07/2023 with acute CVA.  Echocardiogram showed EF 70 to 75%, hyperdynamic RV function, no significant valvular disease.  Discharged with 30-day monitor.  Plan for aspirin plus Plavix x 3 weeks, then aspirin alone.  Currently wearing 30 day monitor.   Since discharge from hospital, she reports she is doing okay.  Denies any chest pain, dyspnea,or palpitations.  Reports some lightheadedness with getting up in the morning, denies any syncope.  Reports some lower extremity edema.  Continues to have left-sided weakness.  She is doing physical therapy.  Never smoked.  Family history includes brother had CVA in 66s.    No past medical history on file.  Past Surgical History:  Procedure Laterality Date   BREAST EXCISIONAL BIOPSY Left    benign    Current Medications: Current Meds  Medication Sig   acetaminophen (TYLENOL) 325 MG tablet 1-2 tablets as needed for mild pain Orally every 4 hrs   aspirin EC 81 MG tablet 1 tablet Orally Once a day   famotidine (PEPCID) 40 MG tablet Take 1 tablet (40 mg total) by mouth at bedtime.   melatonin 5 MG TABS 1 tablet in the evening Orally Once a day   methimazole (TAPAZOLE) 5 MG tablet 1 tablet Orally Once a day for 90 days   metoprolol tartrate (LOPRESSOR) 25 MG tablet 1 tablet with food Orally Twice a day   Multiple Vitamin (MULTI VITAMIN) TABS Take 1 tablet by mouth daily.   rosuvastatin (CRESTOR) 20 MG tablet 1 tablet Orally Once a day   venlafaxine XR  (EFFEXOR-XR) 75 MG 24 hr capsule Take 75 mg by mouth daily with breakfast.     Allergies:   Cat hair extract, Shellfish allergy, and Sulfa antibiotics   Social History   Socioeconomic History   Marital status: Married    Spouse name: Not on file   Number of children: Not on file   Years of education: Not on file   Highest education level: Not on file  Occupational History   Not on file  Tobacco Use   Smoking status: Never    Passive exposure: Past   Smokeless tobacco: Never  Vaping Use   Vaping status: Never Used  Substance and Sexual Activity   Alcohol use: Not Currently   Drug use: Never   Sexual activity: Yes  Other Topics Concern   Not on file  Social History Narrative   Not on file   Social Determinants of Health   Financial Resource Strain: Not on file  Food Insecurity: No Food Insecurity (08/05/2023)   Hunger Vital Sign    Worried About Running Out of Food in the Last Year: Never true    Ran Out of Food in the Last Year: Never true  Transportation Needs: No Transportation Needs (08/05/2023)   PRAPARE - Administrator, Civil Service (Medical): No    Lack of Transportation (Non-Medical): No  Physical Activity: Not on file  Stress:  Not on file  Social Connections: Not on file     Family History: The patient's family history includes Cancer - Ovarian in her sister.  ROS:   Please see the history of present illness.     All other systems reviewed and are negative.  EKGs/Labs/Other Studies Reviewed:    The following studies were reviewed today:   EKG:   09/25/2023: Normal sinus rhythm, rate 62, Q waves in leads aVF and III  Recent Labs: 08/04/2023: TSH 0.286 08/11/2023: ALT 21 08/17/2023: BUN 23; Creatinine, Ser 1.03; Hemoglobin 14.6; Platelets 240; Potassium 4.3; Sodium 138  Recent Lipid Panel    Component Value Date/Time   CHOL 166 08/04/2023 0319   TRIG 78 08/04/2023 0319   HDL 62 08/04/2023 0319   CHOLHDL 2.7 08/04/2023 0319   VLDL 16  08/04/2023 0319   LDLCALC 88 08/04/2023 0319    Physical Exam:    VS:  BP 120/68 (BP Location: Left Arm, Patient Position: Sitting, Cuff Size: Normal)   Pulse 62   Ht 5' (1.524 m)   Wt 138 lb (62.6 kg)   SpO2 97%   BMI 26.95 kg/m     Wt Readings from Last 3 Encounters:  09/25/23 138 lb (62.6 kg)  09/21/23 137 lb (62.1 kg)  09/02/23 138 lb (62.6 kg)     GEN:  Well nourished, well developed in no acute distress HEENT: Normal NECK: No JVD; No carotid bruits LYMPHATICS: No lymphadenopathy CARDIAC: RRR, no murmurs, rubs, gallops RESPIRATORY:  Clear to auscultation without rales, wheezing or rhonchi  ABDOMEN: Soft, non-tender, non-distended MUSCULOSKELETAL:  No edema; No deformity  SKIN: Warm and dry NEUROLOGIC:  Alert and oriented x 3 PSYCHIATRIC:  Normal affect   ASSESSMENT:    1. Cerebrovascular accident (CVA), unspecified mechanism (HCC)   2. Essential hypertension   3. Hyperlipidemia, unspecified hyperlipidemia type    PLAN:    CVA: admitted 07/2023 with acute CVA.  Echocardiogram showed EF 70 to 75%, hyperdynamic RV function, no significant valvular disease.   -Continue ASA, statin -Currently wearing 30 day monitor, will f/u results  Hypertension: On metoprolol 25 mg twice daily. Appears controlled  Hyperlipidemia: On rosuvastatin 20 mg daily  Hyperthyroidism: On methimazole, follows with endocrinology  RTC in 6 months   Medication Adjustments/Labs and Tests Ordered: Current medicines are reviewed at length with the patient today.  Concerns regarding medicines are outlined above.  Orders Placed This Encounter  Procedures   Lipid panel   EKG 12-Lead   No orders of the defined types were placed in this encounter.   Patient Instructions  Medication Instructions:  NO CHANGES    *If you need a refill on your cardiac medications before your next appointment, please call your pharmacy*   Lab Work: Your physician recommends that you return for lab work  in: October 25 2023 - Fasting Lipids    If you have labs (blood work) drawn today and your tests are completely normal, you will receive your results only by: MyChart Message (if you have MyChart) OR A paper copy in the mail If you have any lab test that is abnormal or we need to change your treatment, we will call you to review the results.   Testing/Procedures: NONE    Follow-Up: At [facility], you and your health needs are our priority.  As part of our continuing mission to provide you with exceptional heart care, we have created designated Provider Care Teams.  These Care Teams include your primary Cardiologist (physician)  and Advanced Practice Providers (APPs -  Physician Assistants and Nurse Practitioners) who all work together to provide you with the care you need, when you need it.  We recommend signing up for the patient portal called "MyChart".  Sign up information is provided on this After Visit Summary.  MyChart is used to connect with patients for Virtual Visits (Telemedicine).  Patients are able to view lab/test results, encounter notes, upcoming appointments, etc.  Non-urgent messages can be sent to your provider as well.   To learn more about what you can do with MyChart, go to [email or web address].    Your next appointment:   6 month(s)  The format for your next appointment:   In Person  Provider:   Little Ishikawa, MD    Other Instructions  [facility]:      Signed, Little Ishikawa, MD  09/25/2023 11:12 AM    [facility]

## 2023-09-25 ENCOUNTER — Ambulatory Visit: Payer: PPO | Attending: Cardiology | Admitting: Cardiology

## 2023-09-25 ENCOUNTER — Encounter: Payer: Self-pay | Admitting: Cardiology

## 2023-09-25 VITALS — BP 120/68 | HR 62 | Ht 60.0 in | Wt 138.0 lb

## 2023-09-25 DIAGNOSIS — I10 Essential (primary) hypertension: Secondary | ICD-10-CM

## 2023-09-25 DIAGNOSIS — E785 Hyperlipidemia, unspecified: Secondary | ICD-10-CM

## 2023-09-25 DIAGNOSIS — I639 Cerebral infarction, unspecified: Secondary | ICD-10-CM

## 2023-09-25 NOTE — Patient Instructions (Addendum)
Medication Instructions:  NO CHANGES    *If you need a refill on your cardiac medications before your next appointment, please call your pharmacy*   Lab Work: Your physician recommends that you return for lab work in: October 25 2023 - Fasting Lipids    If you have labs (blood work) drawn today and your tests are completely normal, you will receive your results only by: MyChart Message (if you have MyChart) OR A paper copy in the mail If you have any lab test that is abnormal or we need to change your treatment, we will call you to review the results.   Testing/Procedures: NONE    Follow-Up: At [facility], you and your health needs are our priority.  As part of our continuing mission to provide you with exceptional heart care, we have created designated Provider Care Teams.  These Care Teams include your primary Cardiologist (physician) and Advanced Practice Providers (APPs -  Physician Assistants and Nurse Practitioners) who all work together to provide you with the care you need, when you need it.  We recommend signing up for the patient portal called "MyChart".  Sign up information is provided on this After Visit Summary.  MyChart is used to connect with patients for Virtual Visits (Telemedicine).  Patients are able to view lab/test results, encounter notes, upcoming appointments, etc.  Non-urgent messages can be sent to your provider as well.   To learn more about what you can do with MyChart, go to [email or web address].    Your next appointment:   6 month(s)  The format for your next appointment:   In Person  Provider:   Little Ishikawa, MD    Other Instructions  [facility]:

## 2023-09-29 ENCOUNTER — Ambulatory Visit: Payer: PPO | Admitting: Physical Therapy

## 2023-09-29 ENCOUNTER — Ambulatory Visit: Payer: PPO | Admitting: Occupational Therapy

## 2023-09-29 VITALS — BP 119/63 | HR 64

## 2023-09-29 DIAGNOSIS — R29898 Other symptoms and signs involving the musculoskeletal system: Secondary | ICD-10-CM

## 2023-09-29 DIAGNOSIS — R2681 Unsteadiness on feet: Secondary | ICD-10-CM

## 2023-09-29 DIAGNOSIS — M6281 Muscle weakness (generalized): Secondary | ICD-10-CM

## 2023-09-29 DIAGNOSIS — R208 Other disturbances of skin sensation: Secondary | ICD-10-CM

## 2023-09-29 DIAGNOSIS — R278 Other lack of coordination: Secondary | ICD-10-CM | POA: Diagnosis not present

## 2023-09-29 DIAGNOSIS — R29818 Other symptoms and signs involving the nervous system: Secondary | ICD-10-CM

## 2023-09-29 NOTE — Therapy (Signed)
OUTPATIENT PHYSICAL THERAPY NEURO TREATMENT   Patient Name: Priscilla Wilson MRN: [id number] DOB:[birth year], 72 y.o., female Today's Date: 09/29/2023   PCP: Sigmund Hazel, MD REFERRING PROVIDER: Mariam Dollar, PA-C  END OF SESSION:  PT End of Session - 09/29/23 1108     Visit Number 8    Number of Visits 13    Date for PT Re-Evaluation 10/30/23    Authorization Type HTA    PT Start Time 1105    PT Stop Time 1143    PT Time Calculation (min) 38 min    Equipment Utilized During Treatment Gait belt    Activity Tolerance Patient tolerated treatment well    Behavior During Therapy WFL for tasks assessed/performed               No past medical history on file. Past Surgical History:  Procedure Laterality Date   BREAST EXCISIONAL BIOPSY Left    benign   Patient Active Problem List   Diagnosis Date Noted   Essential hypertension 08/13/2023   Alteration of sensations, post-stroke 08/13/2023   Acute CVA (cerebrovascular accident) (HCC) 08/10/2023   TIA (transient ischemic attack) 08/03/2023   Stroke determined by clinical assessment (HCC) 08/03/2023    ONSET DATE: 08/14/23 referral  REFERRING DIAG: I63.9 (ICD-10-CM) - Cerebral infarction, unspecified   THERAPY DIAG:  No diagnosis found.  Rationale for Evaluation and Treatment: Rehabilitation  SUBJECTIVE:                                                                                                                                                                                             SUBJECTIVE STATEMENT:  Patient presented to therapy with RW and husband. Denied falls and near falls. Reported arthritis has been bothering her ankles and hands the past two days. Is seeing rheumatologist tomorrow. Reports when she is tired she drags her L foot. Has been doing HEP "it isn't always all that easy." Has walked with the brace here a few times, would prefer to be able to walk without.   Pt accompanied by: significant  other, Priscilla Wilson  PERTINENT HISTORY: none on file  PAIN:  Are you having pain? Yes: NPRS scale: 3/10 Pain location: ankles, hands Pain description: arthritis Aggravating factors: does not know Relieving factors: does not know  PRECAUTIONS: Fall   WEIGHT BEARING RESTRICTIONS: No  FALLS: Has patient fallen in last 6 months? No some near falls when walking in her house, using furniture to regain balance  LIVING ENVIRONMENT: Lives with: lives with their family Lives in: House/apartment Stairs: Yes: Internal: flight steps; on right going up and External: 4 steps; bilateral but cannot  reach both Has following equipment at home: Dan Humphreys - 2 wheeled, shower chair, and Grab bars  PLOF: Requires assistive device for independence  PATIENT GOALS: "my left leg needs work"  OBJECTIVE:  Note: Objective measures were completed at Evaluation unless otherwise noted.  DIAGNOSTIC FINDINGS: 08/04/23 Brain MRI IMPRESSION: 1. 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 Associated mild petechial blood products without frank hemorrhagic transformation or significant regional mass effect. 2. Underlying moderately advanced chronic microvascular ischemic disease.  COGNITION: Overall cognitive status: Within functional limits for tasks assessed   SENSATION: Intermittent areas of numbness in L hemibody  COORDINATION: L LE dysmetric and ataxic heel/shin and figure 8   POSTURE: No Significant postural limitations  LOWER EXTREMITY MMT:    MMT Right Eval Left Eval  Hip flexion 5 4  Hip extension    Hip abduction 5 4  Hip adduction 5 4  Hip internal rotation    Hip external rotation    Knee flexion 5 4  Knee extension 5 4  Ankle dorsiflexion 5 4  Ankle plantarflexion 5 4  Ankle inversion    Ankle eversion    (Blank rows = not tested)  BED MOBILITY:  Independent   TRANSFERS: Assistive device utilized: None  Sit to stand: SBA Stand to sit: SBA Chair to chair:  SBA  STAIRS: Level of Assistance: SBA Stair Negotiation Technique: Step to Pattern with Single Rail on Right Number of Stairs: 4  Height of Stairs: 6   GAIT: Gait pattern: step through pattern, decreased step length- Left, decreased stance time- Left, decreased hip/knee flexion- Left, circumduction- Left, Left steppage, Left foot flat, ataxic, wide BOS, and poor foot clearance- Left Distance walked: clinic Assistive device utilized: Environmental consultant - 2 wheeled and None Level of assistance: Modified independence and SBA    TODAY'S TREATMENT: [facility]Act   Vitals:   09/29/23 1112  BP: 119/63  Pulse: 64   In tall kneeling on mat table at [employer] Table x8 repetitions of hip hinges x8 repetitions of palloff presses against blue band resistance, performed bilaterally x8 repetitions of bilateral shoulder flexion holding 2# weighted bar  Pt w/ shaky LLE when lowering bar  Gait Training Ambulate 2 x115 ft, once turning to R, once turning to L, w/ 6# ankle weight on LLE, CGA no AD Seated rest break between bouts 2 LOB during 1st bout, pt corrects on own w/ stepping strategy  Remove 6# ankle weight, Ambulate 2 x115 ft, once turning to R, once turning to L, CGA no AD Seated rest break between bouts Note increased ataxia of LLE compared to with ankle weight but continued improved dorsiflexion No instances of LOB Pt reports feeling more normal   PATIENT EDUCATION:  Education details: Continue HEP Person educated: Patient and Spouse Education method: Explanation Education comprehension: verbalized understanding and needs further education  HOME EXERCISE PROGRAM:  Verbally added banded LAQ and seated march overs on 10/28 (pt declined handout)  Handout of quadruped clamshells and heel sits on 11/7   GOALS: Goals reviewed with patient? Yes  SHORT TERM GOALS: Target date: 10/02/23  Pt will be independent with initial HEP for improved functional strength and balance  Baseline: to be provided Goal  status: INITIAL  2.  Pt will improve TUG to </= 15 secs to demonstrated reduced fall risk  Baseline: 17.5s no AD Goal status: INITIAL  3.  Pt will improve 5x STS to </= 10 sec to demo improved functional LE strength and balance   Baseline: 11.6s no UE  Goal status: INITIAL  4.  Pt will improve gait speed to >/= .55m/s m/s to demonstrate improved community ambulation  Baseline: .66m/s Goal status: INITIAL  5.  Pt will improve FGA to >/= 8/30 to demonstrate improved balance and reduced fall risk Baseline: 3/30 Goal status: INITIAL   LONG TERM GOALS: Target date: 10/30/23  Pt will be independent with initial HEP for improved functional strength and balance  Baseline: to be provided Goal status: INITIAL  Pt will improve TUG to </= 12 secs to demonstrated reduced fall risk  Baseline: 17.5s no AD Goal status: INITIAL   Pt will improve gait speed to >/= .6 m/s to demonstrate improved community ambulation  Baseline: .44m/s Goal status: INITIAL  Pt will improve FGA to >/= 13/30 to demonstrate improved balance and reduced fall risk Baseline: 3 Goal status: INITIAL  ASSESSMENT:  CLINICAL IMPRESSION:  Emphasis of skilled PT session on core, LE, and UE strengthening as well as addressing LLE ataxia w/ 6# ankle weight. Pt tolerated high kneeling exercises well, demonstrating decreased eccentric control of 2# bar when lowering out of maximal shoulder flexion. Pt w/ greatly improved LLE ataxia when ambulating w/ 6# ankle weight on and moderately improved ataxia after immediate removal of the 6# ankle weight. Continue POC.    OBJECTIVE IMPAIRMENTS: Abnormal gait, decreased balance, decreased coordination, decreased endurance, decreased knowledge of condition, decreased mobility, difficulty walking, decreased strength, impaired sensation, impaired tone, and impaired UE functional use.   ACTIVITY LIMITATIONS: carrying, lifting, squatting, bed mobility, reach over head,  hygiene/grooming, locomotion level, and caring for others  PARTICIPATION LIMITATIONS: meal prep, cleaning, interpersonal relationship, driving, shopping, and community activity  PERSONAL FACTORS: Age, Time since onset of injury/illness/exacerbation, and Transportation are also affecting patient's functional outcome.   REHAB POTENTIAL: Good  CLINICAL DECISION MAKING: Stable/uncomplicated  EVALUATION COMPLEXITY: Low  PLAN:  PT FREQUENCY: 2x/week  PT DURATION: 6 weeks  PLANNED INTERVENTIONS: 97164- PT Re-evaluation, 97110-Therapeutic exercises, 97530- Therapeutic activity, 97112- Neuromuscular re-education, 97535- Self Care, 08657- Manual therapy, [phone]- Gait training, [phone]- Orthotic Fit/training, [phone]- Canalith repositioning, [phone]- Aquatic Therapy, Patient/Family education, Balance training, Stair training, Dry Needling, Vestibular training, Visual/preceptual remediation/compensation, and DME instructions  PLAN FOR NEXT SESSION: Monitor vitals. Add to HEP, L LE coordination, try weights on L ankle (6#), stairs, perturbations, gait training - try brace outside/unlevel?, quadruped, tall kneeling, line dancing, compliant surfaces   Beverely Low, SPT   09/29/2023, 1:18 PM

## 2023-09-29 NOTE — Therapy (Signed)
OUTPATIENT OCCUPATIONAL THERAPY Treatment  Patient Name: Priscilla Wilson MRN: [id number] DOB:[birth year], 72 y.o., female Today's Date: 09/29/2023  PCP: Priscilla Hazel, MD  REFERRING PROVIDER: Charlton Amor, PA-C   END OF SESSION:  OT End of Session - 09/29/23 1410     Visit Number 8    Number of Visits 12    Date for OT Re-Evaluation 10/23/23    Authorization Type [employer] - follows Medicare guidelines    Progress Note Due on Visit 10    OT Start Time 1148    OT Stop Time 1228    OT Time Calculation (min) 40 min    Equipment Utilized During Treatment gait belt    Activity Tolerance Patient tolerated treatment well    Behavior During Therapy WFL for tasks assessed/performed               No past medical history on file. Past Surgical History:  Procedure Laterality Date   BREAST EXCISIONAL BIOPSY Left    benign   Patient Active Problem List   Diagnosis Date Noted   Essential hypertension 08/13/2023   Alteration of sensations, post-stroke 08/13/2023   Acute CVA (cerebrovascular accident) (HCC) 08/10/2023   TIA (transient ischemic attack) 08/03/2023   Stroke determined by clinical assessment (HCC) 08/03/2023   ONSET DATE: 08/14/23 (referral date), 08/03/23 (date of CVA)  REFERRING DIAG: I63.9 (ICD-10-CM) - Cerebral infarction, unspecified   THERAPY DIAG:  Other lack of coordination  Muscle weakness (generalized)  Other symptoms and signs involving the musculoskeletal system  Other symptoms and signs involving the nervous system  Other disturbances of skin sensation  Rationale for Evaluation and Treatment: Rehabilitation  SUBJECTIVE:   SUBJECTIVE STATEMENT: Pt reports things are "going good." Pt reports baking cookies in kitchen with granddaughter over the weekend. Pt reports difficulty with coordination when carrying heavy pots. Pt reports not consistently using walker in kitchen/home as frequently because "walker gets in my way."  Pt  reports plan to see the rheumatologist tomorrow.  Pt accompanied by: self and significant other (husband: Priscilla Wilson)  PERTINENT HISTORY: Acute CVA, Essential HTN, alteration of sensation post-stroke, hyperthyroidism, asthma, hyperlipidemia, GERD, pre-diabetes  Per 08/21/23 D/C Summary: "Priscilla Wilson is a 72 y.o. female presented to the [facility] ED on 08/03/2023 with sudden onset of left-sided numbness and weakness... 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 2D echo LVEF 70-75%. Hgb A1C 6.3%."   PRECAUTIONS: None Pt reported "they say I'm not allowed to drive" since stroke.  WEIGHT BEARING RESTRICTIONS: No  PAIN:  Are you having pain? Pt reports 3/10 pain at B ankles and B hands (arthritis pain).  FALLS: Has patient fallen in last 6 months? No  LIVING ENVIRONMENT: Lives with: lives with their spouse Lives in: House/apartment Stairs: Yes: Internal: 16 steps; on left going up and External: 3 steps; bilateral but cannot reach both - Pt reported not needing to go upstairs during the day because all appliances and room accessible at home. Has following equipment at home: Dan Humphreys - 2 wheeled, shower chair, and Grab bars  PLOF: Independent, pt driving, pt retired  PATIENT GOALS: Pt reported "I don't have all my feeling back in L arm, but it's come a long way." "I want to be able to do all the things I did before." Pt reports wanting to get back to line dancing and driving, to "get rid of walker," and to "make  Christmas cookies.  OBJECTIVE:  Note: Objective measures were completed at Evaluation unless otherwise noted.  HAND DOMINANCE: Right  ADLs: Overall ADLs: ind, sometimes with supervision for safety Transfers/ambulation related to ADLs: ind with supervision Eating: ind Grooming: ind UB Dressing: ind, no difficulty with hooking bra,  manipulating buttons, zippers LB Dressing: ind Toileting: ind with grab bar next to toilet Bathing: ind Tub Shower transfers: no difficulty; with supervision  Equipment: Walk in shower and walk-in tub  IADLs: Shopping: dependent Light housekeeping: have not attempted some tasks (e.g. laundry, dishwashing), ind wiping table and pt reports she is "pretty sure I can do [employer], putting dishes in dishwasher]". Meal Prep: currently dependent because husband wants to complete tasks. Pt reports she may be able to complete cutting tasks. Pt reports difficulty carrying heavy objects, such as pan or tray.  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 Pt's spouse reports pt has some unsteadiness "from time-to-time" though reports it has improved since stroke. Pt reports some difficulty with balance when moving during some tasks (e.g. attempting to stand on one foot for LB dressing while leaning against bed). Pt generally uses the walker during the day.  POSTURE COMMENTS:  Ind, no difficulty , supports self   ACTIVITY TOLERANCE: Activity tolerance: Pt reported increased fatigue during the day, such as from re-learning tasks since the stroke, walking far distances, and completing hand exercises.  FUNCTIONAL OUTCOME MEASURES: FOTO: 55    UPPER EXTREMITY ROM:    Active ROM Right eval Left eval  Shoulder flexion [facility]  Shoulder abduction    Shoulder adduction    Shoulder extension    Shoulder internal rotation    Shoulder external rotation    Elbow flexion    Elbow extension    Wrist flexion    Wrist extension    Wrist ulnar deviation    Wrist radial deviation    Wrist pronation    Wrist supination    (Blank rows = not tested)  Note: OT noted slightly slower movements of LUE compared to RUE.  UPPER EXTREMITY MMT:      MMT Right eval Left eval  Shoulder flexion 4 4-  Shoulder abduction 4 4-  Shoulder adduction    Shoulder extension    Shoulder internal rotation    Shoulder external rotation    Middle trapezius    Lower trapezius    Elbow flexion    Elbow extension    Wrist flexion    Wrist extension    Wrist ulnar deviation    Wrist radial deviation    Wrist pronation    Wrist supination    (Blank rows = not tested)  HAND FUNCTION: Grip strength: Right: 34.9 lbs; Left: 43.8 lbs - average of 3 trials  OT noted increased shaking of LUE compared to RUE when pt grasped dynamometer with LUE.  COORDINATION: 9 Hole Peg test: Right: 18 sec; Left: 64 sec  Several drops with LUE, shaking of LUE.  SENSATION: Pt reports tips of fingers have difficulty with grading of force: e.g. "I can't tell how tight I'm holding something."   Pt reports impaired sensation of LUE "Like there is a veil over my left arm." Pt reports numbness of L axilla. Pt reports "tightness" of L bicep.  EDEMA: None noted  MUSCLE TONE: none noted  COGNITION: Overall cognitive status: Within functional limits for tasks assessed  VISION: Subjective  report: Pt reports no changes in vision. Pt correctly read clock on wall. Baseline vision: Wears glasses all the time, Wears glasses for distance and reading. Visual history: cataracts - pt reports doctor says cataracts are "starting" though no treatment yet.  VISION ASSESSMENT: Not tested Appeared [facility] when completing other assessment tasks   Patient has difficulty with following activities due to following visual impairments: none noted.  PERCEPTION: Not tested  PRAXIS: Not tested  OBSERVATIONS: Pt accompanied by husband. Pt appeared well-kept. Pt ambulated with 2-wheeled rolling walker with slow altered gait.   TODAY'S TREATMENT:                                                                                                                              DATE:    Self-Care Simulated kitchen tasks with gait belt - to improve ind for baking/cooking tasks, to improve stability and safety when navigating kitchen, to improve LUE functional reach, to improve attention to body mechanics/ergonomics during functional tasks.   Pt generally navigated kitchen without walker today requiring supervision assist to CGA. Pt briefly trialed using walker to carry a large tray. OT educated pt on body mechanics/ergonomics, keeping R foot in front of left foot with wide stance and bending knees when reaching down, using RUE to support LUE when reaching far outside midline especially when lifting or carrying heavy objects, lifting one object at a time for safety, keeping heavy objects close to midline, stepping rather than reaching to keep items closer to center, core stability, and turning body as a unit. Pt verbalized and demo'd understanding of all with v/c.  OT asked PT about pt's walker use in kitchen. PT stated pt did well walking without walker during PT session today and stated pt is okay to attempt tasks without walker when navigating kitchen d/t nearby stable countertops. OT provided update to PT on strategies for navigating kitchen and body mechanics discussed with pt today. PT verbalized understanding.  PATIENT EDUCATION: Education details: See today's treatment above, body mechanics/ergonomics Person educated: Patient and Spouse Education method: Explanation, Demonstration, Tactile cues, Verbal cues, and Handouts Education comprehension: verbalized understanding and returned demonstration  HOME EXERCISE PROGRAM: 09/03/23 - Shoulder and theraputty. Access Code: [email or web address] 09/07/23 - Coordination HEP (handout, see pt instructions) 09/10/23 - Sensory precautions and sensory activities (handout, see pt instructions)  09/17/23 -  Yellow Theraband  Access Code: [email or web address] URL: [email or web address] Date: 09/17/2023 Prepared by: Carilyn Goodpasture  Exercises -  Seated Elbow Flexion with Self-Anchored Resistance  - 2 x daily - 2 sets - 10 reps - Seated Shoulder Diagonal Pulls with Resistance  - 2 x daily - 2 sets - 10 reps - Seated Shoulder Flexion with Self-Anchored Resistance  - 2 x daily - 2 sets - 10 reps - Seated Shoulder Horizontal Abduction with Resistance  - 2 x daily - 2 sets - 10 reps - Seated Bilateral Shoulder External Rotation with Resistance  - 2 x daily - 2  sets - 10 reps   GOALS: Goals reviewed with patient? Yes  SHORT TERM GOALS: Target date: 09/25/23  Patient will demonstrate improved FM coordination by completing nine-hole peg with use of LUE in 53 seconds or less with no more than 3 drops. Baseline: Left: 64 sec, Several drops with LUE 09/22/23 - Left: 43 seconds, 1 drop Goal status: 09/22/23 - MET  2.  Pt will demonstrate independence with initial HEP for coordination and theraputty of LUE. Baseline: New to outpt OT 09/22/23 - Pt demo'd ind with HEP Goal status: 09/22/23 - MET  3. Patient will demonstrate at least 5 lbs increase of LUE grip strength (average 3 trials) and stable LUE grasp as needed to open jars and other containers.  Baseline: Left: 43.8 lbs, shaking of LUE when grasping dynamometer 09/22/23 - 50.7 lbs average, relatively stable grasp with LUE (mild shaking)  Goal status: 09/22/23 - MET  4. Pt will report little difficulty picking a coin up from the table using LUE.  Baseline: Per FOTO, pt picks up coins from table "with much difficulty" 09/22/23 - Pt easily picked up x5 pennies and placed in slot with LUE. Pt reported no diffculty with picking up coins. Goal status: 09/22/23 - MET   LONG TERM GOALS: Target date: 10/23/23  Patient will demonstrate improved FM coordination by completing nine-hole peg with use of LUE in 40 seconds or less with no more than 1 drop. Baseline: Left: 64 sec, Several drops with LUE Goal status: INITIAL  2.  Pt will demonstrate independence with updated HEP PRN. Baseline:  New to outpt OT Goal status: INITIAL  3.  Pt will participate in cooking/baking task safely at mod I level. Baseline: pt has not attempted cooking/baking tasks since stroke Goal status: INITIAL  4.  Pt will report no more than mild difficulty with carrying an item in her left, affected hand to improve participation in light household tasks (e.g. dishwashing, laundry). Baseline: pt reports difficulty carrying heavy items Goal status: INITIAL  5.  Pt will complete D/C FOTO Baseline: Intake FOTO completed at eval Goal status: INITIAL  ASSESSMENT:  CLINICAL IMPRESSION: Pt tolerated tasks well today though will benefit from continuing education for body mechanics/ergonomics. Pt tolerated standing functional tasks very well. Pt would continue to benefit from skilled OT services in the outpatient setting to work on impairments as noted below to help pt return to PLOF as able.     PERFORMANCE DEFICITS: in functional skills including ADLs, IADLs, coordination, dexterity, proprioception, sensation, ROM, strength, Fine motor control, Gross motor control, balance, body mechanics, endurance, and UE functional use, cognitive skills including energy/drive, and psychosocial skills including environmental adaptation.   IMPAIRMENTS: are limiting patient from ADLs, IADLs, leisure, and social participation.   CO-MORBIDITIES: may have co-morbidities  that affects occupational performance. Patient will benefit from skilled OT to address above impairments and improve overall function.  REHAB POTENTIAL: Good  PLAN:  OT FREQUENCY: 1-2x/week  (2x per week initially, progress to 1x per week PRN if pt makes good progress)  OT DURATION: 6 weeks (POC extended 2 weeks to allow for scheduling)  PLANNED INTERVENTIONS: 97168 OT Re-evaluation, 97535 self care/ADL training, 95284 therapeutic exercise, 97530 therapeutic activity, 97112 neuromuscular re-education, 97140 manual therapy, 97035 ultrasound, 97018 paraffin,  13244 fluidotherapy, 97010 moist heat, 97010 cryotherapy, 97032 electrical stimulation (manual), 97760 Orthotics management and training, 01027 Splinting (initial encounter), [id number] Subsequent splinting/medication, manual lymph drainage, passive range of motion, functional mobility training, visual/perceptual remediation/compensation, energy conservation, patient/family education, and  DME and/or AE instructions  RECOMMENDED OTHER SERVICES: PT eval re-scheduled  CONSULTED AND AGREED WITH PLAN OF CARE: Patient and family member/caregiver  PLAN FOR NEXT SESSION:   Update HEP for LUE stability/coordination - e.g. shoulder flexion with dumbbell, LUE exercises using dowel, etc.  Higher level coordination tasks  Body mechanics/ergonomics  Functional simulated tasks for standing tolerance - kitchen, dishwashing, laundry  UE strengthening tasks - update theraputty and theraband PRN  [employer] - standing with gait belt   Wynetta Emery, OT 09/29/2023, 2:20 PM

## 2023-09-30 DIAGNOSIS — Z79899 Other long term (current) drug therapy: Secondary | ICD-10-CM | POA: Diagnosis not present

## 2023-09-30 DIAGNOSIS — Z1589 Genetic susceptibility to other disease: Secondary | ICD-10-CM | POA: Diagnosis not present

## 2023-09-30 DIAGNOSIS — M1991 Primary osteoarthritis, unspecified site: Secondary | ICD-10-CM | POA: Diagnosis not present

## 2023-09-30 DIAGNOSIS — Z6826 Body mass index (BMI) 26.0-26.9, adult: Secondary | ICD-10-CM | POA: Diagnosis not present

## 2023-09-30 DIAGNOSIS — I639 Cerebral infarction, unspecified: Secondary | ICD-10-CM | POA: Diagnosis not present

## 2023-09-30 DIAGNOSIS — E663 Overweight: Secondary | ICD-10-CM | POA: Diagnosis not present

## 2023-09-30 DIAGNOSIS — M0579 Rheumatoid arthritis with rheumatoid factor of multiple sites without organ or systems involvement: Secondary | ICD-10-CM | POA: Diagnosis not present

## 2023-10-01 ENCOUNTER — Ambulatory Visit: Payer: PPO

## 2023-10-01 ENCOUNTER — Encounter: Payer: Self-pay | Admitting: Physical Medicine & Rehabilitation

## 2023-10-01 ENCOUNTER — Encounter: Payer: PPO | Attending: Registered Nurse | Admitting: Physical Medicine & Rehabilitation

## 2023-10-01 ENCOUNTER — Ambulatory Visit: Payer: PPO | Admitting: Occupational Therapy

## 2023-10-01 VITALS — BP 130/74 | HR 66

## 2023-10-01 VITALS — BP 123/72 | HR 67 | Ht 60.0 in | Wt 138.0 lb

## 2023-10-01 DIAGNOSIS — R29818 Other symptoms and signs involving the nervous system: Secondary | ICD-10-CM

## 2023-10-01 DIAGNOSIS — M6281 Muscle weakness (generalized): Secondary | ICD-10-CM

## 2023-10-01 DIAGNOSIS — I639 Cerebral infarction, unspecified: Secondary | ICD-10-CM

## 2023-10-01 DIAGNOSIS — R269 Unspecified abnormalities of gait and mobility: Secondary | ICD-10-CM | POA: Diagnosis not present

## 2023-10-01 DIAGNOSIS — R278 Other lack of coordination: Secondary | ICD-10-CM

## 2023-10-01 DIAGNOSIS — R209 Unspecified disturbances of skin sensation: Secondary | ICD-10-CM | POA: Insufficient documentation

## 2023-10-01 DIAGNOSIS — R2681 Unsteadiness on feet: Secondary | ICD-10-CM

## 2023-10-01 DIAGNOSIS — I69393 Ataxia following cerebral infarction: Secondary | ICD-10-CM | POA: Diagnosis not present

## 2023-10-01 DIAGNOSIS — R29898 Other symptoms and signs involving the musculoskeletal system: Secondary | ICD-10-CM

## 2023-10-01 DIAGNOSIS — I69398 Other sequelae of cerebral infarction: Secondary | ICD-10-CM | POA: Insufficient documentation

## 2023-10-01 DIAGNOSIS — R208 Other disturbances of skin sensation: Secondary | ICD-10-CM

## 2023-10-01 NOTE — Therapy (Signed)
OUTPATIENT PHYSICAL THERAPY NEURO TREATMENT   Patient Name: Priscilla Wilson MRN: [id number] DOB:1951-08-17, 72 y.o., female Today's Date: 10/01/2023   PCP: Sigmund Hazel, MD REFERRING PROVIDER: Mariam Dollar, PA-C  END OF SESSION:  PT End of Session - 10/01/23 1101     Visit Number 9    Number of Visits 13    Date for PT Re-Evaluation 10/30/23    Authorization Type HTA    PT Start Time 1101    PT Stop Time 1143    PT Time Calculation (min) 42 min    Equipment Utilized During Treatment Gait belt    Activity Tolerance Patient tolerated treatment well               No past medical history on file. Past Surgical History:  Procedure Laterality Date   BREAST EXCISIONAL BIOPSY Left    benign   Patient Active Problem List   Diagnosis Date Noted   Essential hypertension 08/13/2023   Alteration of sensations, post-stroke 08/13/2023   Acute CVA (cerebrovascular accident) (HCC) 08/10/2023   TIA (transient ischemic attack) 08/03/2023   Stroke determined by clinical assessment (HCC) 08/03/2023    ONSET DATE: 08/14/23 referral  REFERRING DIAG: I63.9 (ICD-10-CM) - Cerebral infarction, unspecified   THERAPY DIAG:  Other lack of coordination  Muscle weakness (generalized)  Unsteadiness on feet  Acute CVA (cerebrovascular accident) (HCC)  Rationale for Evaluation and Treatment: Rehabilitation  SUBJECTIVE:                                                                                                                                                                                             SUBJECTIVE STATEMENT:  Patient presented to therapy with RW and husband. Denied falls and near falls. Saw rheumatologist yesterday for arthritis in ankles and hands.   Pt accompanied by: significant other, [name]  PERTINENT HISTORY: none on file  PAIN:  Are you having pain? Yes: NPRS scale: 2/10 Pain location: ankles, hands Pain description: arthritis Aggravating factors: does  not know Relieving factors: does not know  PRECAUTIONS: Fall   WEIGHT BEARING RESTRICTIONS: No  FALLS: Has patient fallen in last 6 months? No some near falls when walking in her house, using furniture to regain balance  LIVING ENVIRONMENT: Lives with: lives with their family Lives in: House/apartment Stairs: Yes: Internal: flight steps; on right going up and External: 4 steps; bilateral but cannot reach both Has following equipment at home: Walker - 2 wheeled, shower chair, and Grab bars  PLOF: Requires assistive device for independence  PATIENT GOALS: "my left leg needs work"  OBJECTIVE:  Note: Objective measures were completed at  Evaluation unless otherwise noted.  DIAGNOSTIC FINDINGS: 08/04/23 Brain MRI IMPRESSION: 1. 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 Associated mild petechial blood products without frank hemorrhagic transformation or significant regional mass effect. 2. Underlying moderately advanced chronic microvascular ischemic disease.  COGNITION: Overall cognitive status: Within functional limits for tasks assessed   SENSATION: Intermittent areas of numbness in L hemibody  COORDINATION: L LE dysmetric and ataxic heel/shin and figure 8   POSTURE: No Significant postural limitations  LOWER EXTREMITY MMT:    MMT Right Eval Left Eval  Hip flexion 5 4  Hip extension    Hip abduction 5 4  Hip adduction 5 4  Hip internal rotation    Hip external rotation    Knee flexion 5 4  Knee extension 5 4  Ankle dorsiflexion 5 4  Ankle plantarflexion 5 4  Ankle inversion    Ankle eversion    (Blank rows = not tested)  BED MOBILITY:  Independent   TRANSFERS: Assistive device utilized: None  Sit to stand: SBA Stand to sit: SBA Chair to chair: SBA  STAIRS: Level of Assistance: SBA Stair Negotiation Technique: Step to Pattern with Single Rail on Right Number of Stairs: 4  Height of Stairs: 6   GAIT: Gait pattern: step through pattern,  decreased step length- Left, decreased stance time- Left, decreased hip/knee flexion- Left, circumduction- Left, Left steppage, Left foot flat, ataxic, wide BOS, and poor foot clearance- Left Distance walked: clinic Assistive device utilized: Walker - 2 wheeled and None Level of assistance: Modified independence and SBA    TODAY'S TREATMENT:  [facility]Act    Vitals:   10/01/23 1108  BP: 130/74  Pulse: 66     [facility] PT Assessment - 10/01/23 0001       Functional Gait  Assessment   Gait assessed  Yes    Gait Level Surface Walks 20 ft in less than 7 sec but greater than 5.5 sec, uses assistive device, slower speed, mild gait deviations, or deviates 6-10 in outside of the 12 in walkway width.    Change in Gait Speed Able to change speed, demonstrates mild gait deviations, deviates 6-10 in outside of the 12 in walkway width, or no gait deviations, unable to achieve a major change in velocity, or uses a change in velocity, or uses an assistive device.    Gait with Horizontal Head Turns Performs head turns smoothly with slight change in gait velocity (eg, minor disruption to smooth gait path), deviates 6-10 in outside 12 in walkway width, or uses an assistive device.    Gait with Vertical Head Turns Performs task with slight change in gait velocity (eg, minor disruption to smooth gait path), deviates 6 - 10 in outside 12 in walkway width or uses assistive device    Gait and Pivot Turn Pivot turns safely within 3 sec and stops quickly with no loss of balance.    Step Over Obstacle Is able to step over 2 stacked shoe boxes taped together (9 in total height) without changing gait speed. No evidence of imbalance.    Gait with Narrow Base of Support Is able to ambulate for 10 steps heel to toe with no staggering.    Gait with Eyes Closed Walks 20 ft, uses assistive device, slower speed, mild gait deviations, deviates 6-10 in outside 12 in walkway width. Ambulates 20 ft in less than 9 sec but greater than 7  sec.    Ambulating Backwards Walks 20 ft, uses assistive device, slower speed, mild  gait deviations, deviates 6-10 in outside 12 in walkway width.    Steps Alternating feet, must use rail.    Total Score 23            LTG check: - TUG - 5xSTS - - FGA   Gait: - 230' no AD, CGA   Half kneeling: - balloon taps, 2 x fatigue   - L LE on knee, R LE on foot   Line Dancing: - 4 square    PATIENT EDUCATION:  Education details: Continue HEP Person educated: Patient and Spouse Education method: Explanation Education comprehension: verbalized understanding and needs further education  HOME EXERCISE PROGRAM:  Verbally added banded LAQ and seated march overs on 10/28 (pt declined handout)  Handout of quadruped clamshells and heel sits on 11/7   GOALS: Goals reviewed with patient? Yes  SHORT TERM GOALS: Target date: 10/02/23  Pt will be independent with initial HEP for improved functional strength and balance.  Baseline: to be provided 10/01/23: verbalized compliance  Goal status: MET  2.  Pt will improve TUG to </= 15 secs to demonstrated reduced fall risk.  Baseline: 17.5s no AD 10/01/23: 12.75 sec no AD  Goal status: MET  3.  Pt will improve 5x STS to </= 10 sec to demo improved functional LE strength and balance.   Baseline: 11.6s no UE 10/01/23: 12.69 sec no UE  Goal status: NOT MET  4.  Pt will improve gait speed to >/= .62m/s m/s to demonstrate improved community ambulation  Baseline: .22m/s  10/01/23: 1.08 m/s no AD Goal status: MET LTG  5.  Pt will improve FGA to >/= 8/30 to demonstrate improved balance and reduced fall risk. Baseline: 3/30  10/01/23: 23/30  Goal status: MET LTG   LONG TERM GOALS: Target date: 10/30/23  Pt will be independent with initial HEP for improved functional strength and balance  Baseline: to be provided Goal status: INITIAL  Pt will improve TUG to </= 12 secs to demonstrated reduced fall risk  Baseline: 17.5s no  AD Goal status: INITIAL   Pt will improve gait speed to >/= .6 m/s to demonstrate improved community ambulation  Baseline: .14m/s Goal status: INITIAL  Pt will improve FGA to >/= 13/30 to demonstrate improved balance and reduced fall risk Baseline: 3 Goal status: INITIAL  ASSESSMENT:  CLINICAL IMPRESSION:  Patient see for skilled physical therapy session with emphases on checking Short Term Goals and continued L LE functional strength and stability. Patient met 4 out of 5 of her Short Term Goals. TUG improved from 17.5 sec to 12.75 sec. improved from .48 m/s to 1.08 m/s., which is appropriate for community ambulation distances, and also meets her Long Term Goal. FGA score improved from 3/30 to 23/30, which also meets her Long Term Goal. She did really well with balloon tapping exercise and line dancing exercise. She was able to reach outside her BOS in tall kneeling and maintain/recover her balance utilizing her L LE and core. Minor stumbles with dance steps but able to recover herself. She would like to continue to work on walking. Continue POC.    OBJECTIVE IMPAIRMENTS: Abnormal gait, decreased balance, decreased coordination, decreased endurance, decreased knowledge of condition, decreased mobility, difficulty walking, decreased strength, impaired sensation, impaired tone, and impaired UE functional use.   ACTIVITY LIMITATIONS: carrying, lifting, squatting, bed mobility, reach over head, hygiene/grooming, locomotion level, and caring for others  PARTICIPATION LIMITATIONS: meal prep, cleaning, interpersonal relationship, driving, shopping, and community activity  PERSONAL  FACTORS: Age, Time since onset of injury/illness/exacerbation, and Transportation are also affecting patient's functional outcome.   REHAB POTENTIAL: Good  CLINICAL DECISION MAKING: Stable/uncomplicated  EVALUATION COMPLEXITY: Low  PLAN:  PT FREQUENCY: 2x/week  PT DURATION: 6 weeks  PLANNED  INTERVENTIONS: 97164- PT Re-evaluation, 97110-Therapeutic exercises, 97530- Therapeutic activity, 97112- Neuromuscular re-education, 97535- Self Care, 16109- Manual therapy, [phone]- Gait training, [phone]- Orthotic Fit/training, [phone]- Canalith repositioning, [phone]- Aquatic Therapy, Patient/Family education, Balance training, Stair training, Dry Needling, Vestibular training, Visual/preceptual remediation/compensation, and DME instructions  PLAN FOR NEXT SESSION: Monitor vitals. Add to HEP, L LE coordination, try weights on L ankle (6#), stairs, perturbations, gait training - try brace outside/unlevel?, quadruped, tall kneeling, line dancing, compliant surfaces    Kaylinn Dedic M Doran Nestle, SPT 10/01/2023, 11:57 AM

## 2023-10-01 NOTE — Progress Notes (Signed)
Subjective:    Patient ID: Priscilla Wilson, female    DOB: 09/12/1951, 72 y.o.   MRN: [id number] Inpatient rehabilitation admission [facility] following stroke Admit date: 08/10/2023 Discharge date: 08/21/2023 72 y.o. female presented to the [facility] ED on 08/03/2023 with sudden onset of left-sided numbness and weakness. She was emergently evaluated by teleneurology and she was given TNKase. PMH significant for hyperthyroidism on methimazole, asthma, hyperlipidemia, GERD. Methimazole initiated with improvement in heart rate, anxiety. Metoprolol ordered with improvement in blood pressure, Cleviprex off. 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 2D echo LVEF 70-75%. Hgb A1C 6.3%. Now on aspirin 81 mg daily and clopidogrel 75 mg daily for 3 weeks then aspirin alone. Recommend 30 day cardiac event monitoring. Patient continues to need up to mod A to navigate rolling walker when turning and max cues for placement of LLE. Increased cues needed for awareness of LUE with bed mobility and functional mobility.      HPI 72 year old female with history of rheumatoid arthritis primarily affecting her hands and ankles who follows up today after her inpatient rehabilitation stay following a right thalamic capsular stroke.  Her main residual symptoms are numbness in the fingertips on the left side.  Overall she feels like her sensation has discharged from hospital. She has been attending outpatient rehabilitation PT and OT twice a week for the last month.  Grip weaker on RIght due to arthritis   No falls Mod I all self care  The patient has done a little bit of laundry but no dishwashing. The patient has been seen by her rheumatologist who recently started her on hydroxychloroquine for rheumatoid arthritis.   Pain Inventory Average Pain 0 Pain  Right Now 0 My pain is intermittent and aching  LOCATION OF PAIN  hands & ankles  BOWEL Number of stools per week: 8-10 Oral laxative use No  Type of laxative None  BLADDER Normal    Mobility walk without assistance walk with assistance use a walker how many minutes can you walk? A lot  ability to climb steps?  yes do you drive?  no Do you have any goals in this area?  yes  Function retired Do you have any goals in this area?  yes  Neuro/Psych weakness numbness trouble walking  Prior Studies Any changes since last visit?  no  Physicians involved in your care Any changes since last visit?  no   Family History  Problem Relation Age of Onset   Cancer - Ovarian Sister    Social History   Socioeconomic History   Marital status: Married    Spouse name: Not on file   Number of children: Not on file   Years of education: Not on file   Highest education level: Not on file  Occupational History   Not on file  Tobacco Use   Smoking status: Never    Passive exposure: Past   Smokeless tobacco: Never  Vaping Use   Vaping status: Never Used  Substance and Sexual Activity   Alcohol use: Not Currently   Drug use: Never   Sexual activity: Yes  Other Topics Concern   Not on file  Social History Narrative   Not on file   Social Determinants of Health   Financial Resource Strain: Not on file  Food Insecurity: No Food Insecurity (08/05/2023)   Hunger Vital Sign    Worried  About Running Out of Food in the Last Year: Never true    Ran Out of Food in the Last Year: Never true  Transportation Needs: No Transportation Needs (08/05/2023)   PRAPARE - Administrator, Civil Service (Medical): No    Lack of Transportation (Non-Medical): No  Physical Activity: Not on file  Stress: Not on file  Social Connections: Not on file   Past Surgical History:  Procedure Laterality Date   BREAST EXCISIONAL BIOPSY Left    benign   History reviewed. No pertinent  past medical history. Ht 5' (1.524 m)   Wt 138 lb (62.6 kg)   BMI 26.95 kg/m   Opioid Risk Score:   Fall Risk Score:  `1  Depression screen [facility] 2/9     10/01/2023    1:38 PM 09/02/2023    8:45 AM  Depression screen PHQ 2/9  Decreased Interest 0 0  Down, Depressed, Hopeless 0 0  PHQ - 2 Score 0 0  Altered sleeping  0  Tired, decreased energy  0  Change in appetite  0  Feeling bad or failure about yourself   0  Trouble concentrating  0  Moving slowly or fidgety/restless  0  Suicidal thoughts  0  PHQ-9 Score  0    Review of Systems  Musculoskeletal:  Positive for gait problem.       Left arm, left leg weakness  Neurological:  Positive for weakness and numbness.  All other systems reviewed and are negative.      Objective:   Physical Exam  General No acute distress Mood and affect appropriate Extremities without edema Motor strength is 4/5 in the left deltoid bicep tricep grip hip flexor knee extensor ankle dorsiflexor.  5 5 on the right side with exception right grip which is limited by finger joint range of motion Sensation intact to light touch temperature pinprick and proprioception bilateral upper limbs.  Tact to light touch and pinprick in the lower limbs. Ambulates without assistive device she has a wide-based support. Moderate ataxia left finger-nose-finger mild to moderate ataxia left heel-to-shin    Assessment & Plan:   1.  Right thalamic capsular infarct still has sensory paresthesias but does have intact sensation to pinprick light touch proprioception and temperature on testing.  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 We discussed timeframe of recovery and that she is in relatively early phases.  I would like to see her back in January to monitor her progress.  We discussed return to driving which she would like to do at some point but is not in any hurry to do so.  Her husband is able to drive her wherever she needs to go.

## 2023-10-01 NOTE — Therapy (Signed)
OUTPATIENT OCCUPATIONAL THERAPY Treatment  Patient Name: Priscilla Wilson MRN: [id number] DOB:[birth year], 72 y.o., female Today's Date: 10/01/2023  PCP: Sigmund Hazel, MD  REFERRING PROVIDER: Charlton Amor, PA-C   END OF SESSION:  OT End of Session - 10/01/23 1307     Visit Number 9    Number of Visits 12    Date for OT Re-Evaluation 10/23/23    Authorization Type [employer] - follows Medicare guidelines    Progress Note Due on Visit 10    OT Start Time 1146    OT Stop Time 1231    OT Time Calculation (min) 45 min    Activity Tolerance Patient tolerated treatment well    Behavior During Therapy [facility] for tasks assessed/performed                No past medical history on file. Past Surgical History:  Procedure Laterality Date   BREAST EXCISIONAL BIOPSY Left    benign   Patient Active Problem List   Diagnosis Date Noted   Essential hypertension 08/13/2023   Alteration of sensations, post-stroke 08/13/2023   Acute CVA (cerebrovascular accident) (HCC) 08/10/2023   TIA (transient ischemic attack) 08/03/2023   Stroke determined by clinical assessment (HCC) 08/03/2023   ONSET DATE: 08/14/23 (referral date), 08/03/23 (date of CVA)  REFERRING DIAG: I63.9 (ICD-10-CM) - Cerebral infarction, unspecified   THERAPY DIAG:  Other lack of coordination  Muscle weakness (generalized)  Other symptoms and signs involving the musculoskeletal system  Other symptoms and signs involving the nervous system  Other disturbances of skin sensation  Rationale for Evaluation and Treatment: Rehabilitation  SUBJECTIVE:   SUBJECTIVE STATEMENT: Pt reports new medication prescribed by rheumatologist - hydroxychloroquine (plaquenil) to address inflammation.  Pt verbalized understanding of body mechanics/ergonomics for daily tasks.  Pt reports increased sensation in L fingertips, which "doesn't feel as tight" as before.  Pt accompanied by: self and significant other  (husband: [name])  PERTINENT HISTORY: Acute CVA, Essential HTN, alteration of sensation post-stroke, hyperthyroidism, asthma, hyperlipidemia, GERD, pre-diabetes  Per 08/21/23 D/C Summary: "Priscilla Wilson is a 72 y.o. female presented to the [facility] ED on 08/03/2023 with sudden onset of left-sided numbness and weakness... 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 2D echo LVEF 70-75%. Hgb A1C 6.3%."   PRECAUTIONS: None Pt reported "they say I'm not allowed to drive" since stroke.  WEIGHT BEARING RESTRICTIONS: No  PAIN:  Are you having pain? Pt reports 2/10 pain at B ankles and B hands (arthritis pain).  FALLS: Has patient fallen in last 6 months? No  LIVING ENVIRONMENT: Lives with: lives with their spouse Lives in: House/apartment Stairs: Yes: Internal: 16 steps; on left going up and External: 3 steps; bilateral but cannot reach both - Pt reported not needing to go upstairs during the day because all appliances and room accessible at home. Has following equipment at home: Dan Humphreys - 2 wheeled, shower chair, and Grab bars  PLOF: Independent, pt driving, pt retired  PATIENT GOALS: Pt reported "I don't have all my feeling back in L arm, but it's come a long way." "I want to be able to do all the things I did before." Pt reports wanting to get back to line dancing and driving, to "get rid of walker," and to "make Christmas cookies.  OBJECTIVE:  Note: Objective measures were completed at Evaluation unless otherwise noted.  HAND DOMINANCE: Right  ADLs:  Overall ADLs: ind, sometimes with supervision for safety Transfers/ambulation related to ADLs: ind with supervision Eating: ind Grooming: ind UB Dressing: ind, no difficulty with hooking bra, manipulating buttons, zippers LB Dressing: ind Toileting: ind with grab bar next to toilet Bathing:  ind Tub Shower transfers: no difficulty; with supervision  Equipment: Walk in shower and walk-in tub  IADLs: Shopping: dependent Light housekeeping: have not attempted some tasks (e.g. laundry, dishwashing), ind wiping table and pt reports she is "pretty sure I can do [employer], putting dishes in dishwasher]". Meal Prep: currently dependent because husband wants to complete tasks. Pt reports she may be able to complete cutting tasks. Pt reports difficulty carrying heavy objects, such as pan or tray.  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 Pt's spouse reports pt has some unsteadiness "from time-to-time" though reports it has improved since stroke. Pt reports some difficulty with balance when moving during some tasks (e.g. attempting to stand on one foot for LB dressing while leaning against bed). Pt generally uses the walker during the day.  POSTURE COMMENTS:  Ind, no difficulty , supports self   ACTIVITY TOLERANCE: Activity tolerance: Pt reported increased fatigue during the day, such as from re-learning tasks since the stroke, walking far distances, and completing hand exercises.  FUNCTIONAL OUTCOME MEASURES: FOTO: 55    UPPER EXTREMITY ROM:    Active ROM Right eval Left eval  Shoulder flexion [facility]  Shoulder abduction    Shoulder adduction    Shoulder extension    Shoulder internal rotation    Shoulder external rotation    Elbow flexion    Elbow extension    Wrist flexion    Wrist extension    Wrist ulnar deviation    Wrist radial deviation    Wrist pronation    Wrist supination    (Blank rows = not tested)  Note: OT noted slightly slower movements of LUE compared to RUE.  UPPER EXTREMITY MMT:     MMT Right eval Left eval  Shoulder flexion 4 4-  Shoulder abduction 4 4-  Shoulder adduction     Shoulder extension    Shoulder internal rotation    Shoulder external rotation    Middle trapezius    Lower trapezius    Elbow flexion    Elbow extension    Wrist flexion    Wrist extension    Wrist ulnar deviation    Wrist radial deviation    Wrist pronation    Wrist supination    (Blank rows = not tested)  HAND FUNCTION: Grip strength: Right: 34.9 lbs; Left: 43.8 lbs - average of 3 trials  OT noted increased shaking of LUE compared to RUE when pt grasped dynamometer with LUE.  COORDINATION: 9 Hole Peg test: Right: 18 sec; Left: 64 sec  Several drops with LUE, shaking of LUE.  SENSATION: Pt reports tips of fingers have difficulty with grading of force: e.g. "I can't tell how tight I'm holding something."   Pt reports impaired sensation of LUE "Like there is a veil over my left arm." Pt reports numbness of L axilla. Pt reports "tightness" of L bicep.  EDEMA: None noted  MUSCLE TONE: none noted  COGNITION: Overall cognitive status: Within functional limits for tasks assessed  VISION: Subjective report: Pt reports no changes in vision. Pt correctly read clock on wall. Baseline vision: Wears glasses all the time, Wears  glasses for distance and reading. Visual history: cataracts - pt reports doctor says cataracts are "starting" though no treatment yet.  VISION ASSESSMENT: Not tested Appeared [facility] when completing other assessment tasks   Patient has difficulty with following activities due to following visual impairments: none noted.  PERCEPTION: Not tested  PRAXIS: Not tested  OBSERVATIONS: Pt accompanied by husband. Pt appeared well-kept. Pt ambulated with 2-wheeled rolling walker with slow altered gait.   TODAY'S TREATMENT:                                                                                                                              DATE:   Neuro Re-Ed OT initiated updated HEP for LUE: Access Code: [email or web address] URL:  [email or web address] Date: 10/01/2023 Prepared by: Carilyn Goodpasture  Exercises - to improve LUE coordination, proprioception, strengthening and to improve attention to upright seated/standing posture and body mechanics- OT provided v/c and written instructions to complete exercises with LUE control and avoid LUE shaking. OT educated pt on avoiding pain with shoulder flexion dowel. Pt verbalized understanding and denied pain throughout all tasks. OT provided pt with an upright mirror to monitor posture and avoid compensatory shoulder movements: - Standing Shoulder Flexion to 100 Degrees with 2 lb Dumbbells  - 1 x daily - 2 sets - 10 reps - Shoulder Abduction to 90 degrees with 2 lb Dumbbells - Thumbs Up  - 1 x daily - 2 sets - 10 reps - Scaption with 2 lb Dumbbells to 80 degrees  - 1 x daily - 2 sets - 10 reps - Prone Shoulder Extension with 1 lb dumbbell  - 1 x daily - 2 sets - 10 reps - Prone Press Up On Elbows  - 1 x daily - 2 sets - 5 reps - 10 s hold - V/c to avoid collapsing/shrugging shoulders. - Supine Shoulder Flexion with Dowel  - 1 x daily - 5 reps - 20 s hold - Standing Wall [employer] or writing alphabet with [employer]  - 1 x daily - 1 sets - 60 s hold  - Standing Shoulder Posterior Capsule Stretch  - 1-3 x daily - 3 reps - 30 s hold - PRN to decrease fatigue.  PATIENT EDUCATION: Education details: See today's treatment above, body mechanics/ergonomics, posture Person educated: Patient and Spouse Education method: Explanation, Demonstration, Tactile cues, Verbal cues, and Handouts Education comprehension: verbalized understanding and returned demonstration  HOME EXERCISE PROGRAM: 09/03/23 - Shoulder and theraputty. Access Code: [email or web address] 09/07/23 - Coordination HEP (handout, see pt instructions) 09/10/23 - Sensory precautions and sensory activities (handout, see pt instructions) 09/17/23 - Yellow Theraband. Access Code: [facility] 10/01/23 - Updated Shoulder HEP -  Access Code: [email or web address]   GOALS: Goals reviewed with patient? Yes  SHORT TERM GOALS: Target date: 09/25/23  Patient will demonstrate improved FM coordination by completing nine-hole peg with use of LUE in 53 seconds or less with no more than 3 drops. Baseline: Left: 64  sec, Several drops with LUE 09/22/23 - Left: 43 seconds, 1 drop Goal status: 09/22/23 - MET  2.  Pt will demonstrate independence with initial HEP for coordination and theraputty of LUE. Baseline: New to outpt OT 09/22/23 - Pt demo'd ind with HEP Goal status: 09/22/23 - MET  3. Patient will demonstrate at least 5 lbs increase of LUE grip strength (average 3 trials) and stable LUE grasp as needed to open jars and other containers.  Baseline: Left: 43.8 lbs, shaking of LUE when grasping dynamometer 09/22/23 - 50.7 lbs average, relatively stable grasp with LUE (mild shaking)  Goal status: 09/22/23 - MET  4. Pt will report little difficulty picking a coin up from the table using LUE.  Baseline: Per FOTO, pt picks up coins from table "with much difficulty" 09/22/23 - Pt easily picked up x5 pennies and placed in slot with LUE. Pt reported no diffculty with picking up coins. Goal status: 09/22/23 - MET   LONG TERM GOALS: Target date: 10/23/23  Patient will demonstrate improved FM coordination by completing nine-hole peg with use of LUE in 40 seconds or less with no more than 1 drop. Baseline: Left: 64 sec, Several drops with LUE Goal status: INITIAL  2.  Pt will demonstrate independence with updated HEP PRN. Baseline: New to outpt OT Goal status: INITIAL  3.  Pt will participate in cooking/baking task safely at mod I level. Baseline: pt has not attempted cooking/baking tasks since stroke Goal status: INITIAL  4.  Pt will report no more than mild difficulty with carrying an item in her left, affected hand to improve participation in light household tasks (e.g. dishwashing, laundry). Baseline: pt reports difficulty  carrying heavy items Goal status: INITIAL  5.  Pt will complete D/C FOTO Baseline: Intake FOTO completed at eval Goal status: INITIAL  ASSESSMENT:  CLINICAL IMPRESSION: Pt tolerated tasks well today, including updated LUE HEP. Pt would continue to benefit from skilled OT services in the outpatient setting to work on impairments as noted below to help pt return to PLOF as able.     PERFORMANCE DEFICITS: in functional skills including ADLs, IADLs, coordination, dexterity, proprioception, sensation, ROM, strength, Fine motor control, Gross motor control, balance, body mechanics, endurance, and UE functional use, cognitive skills including energy/drive, and psychosocial skills including environmental adaptation.   IMPAIRMENTS: are limiting patient from ADLs, IADLs, leisure, and social participation.   CO-MORBIDITIES: may have co-morbidities  that affects occupational performance. Patient will benefit from skilled OT to address above impairments and improve overall function.  REHAB POTENTIAL: Good  PLAN:  OT FREQUENCY: 1-2x/week  (2x per week initially, progress to 1x per week PRN if pt makes good progress)  OT DURATION: 6 weeks (POC extended 2 weeks to allow for scheduling)  PLANNED INTERVENTIONS: 97168 OT Re-evaluation, 97535 self care/ADL training, 16109 therapeutic exercise, 97530 therapeutic activity, 97112 neuromuscular re-education, 97140 manual therapy, 97035 ultrasound, 97018 paraffin, 60454 fluidotherapy, 97010 moist heat, 97010 cryotherapy, 97032 electrical stimulation (manual), 97760 Orthotics management and training, 09811 Splinting (initial encounter), [id number] Subsequent splinting/medication, manual lymph drainage, passive range of motion, functional mobility training, visual/perceptual remediation/compensation, energy conservation, patient/family education, and DME and/or AE instructions  RECOMMENDED OTHER SERVICES: PT eval re-scheduled  CONSULTED AND AGREED WITH PLAN OF CARE:  Patient and family member/caregiver  PLAN FOR NEXT SESSION:   10th Progress Note due next visit - Assess progress towards goals  Higher level coordination tasks  Body mechanics/ergonomics  Functional simulated tasks for standing tolerance - kitchen, dishwashing, laundry  UE strengthening tasks - update theraputty and theraband PRN  Blaze [employer]s - standing with gait belt  Review HEP PRN   Wynetta Emery, OT 10/01/2023, 1:09 PM

## 2023-10-05 ENCOUNTER — Ambulatory Visit: Payer: PPO

## 2023-10-05 VITALS — BP 138/74 | HR 69

## 2023-10-05 DIAGNOSIS — R278 Other lack of coordination: Secondary | ICD-10-CM | POA: Diagnosis not present

## 2023-10-05 DIAGNOSIS — R2681 Unsteadiness on feet: Secondary | ICD-10-CM

## 2023-10-05 DIAGNOSIS — M6281 Muscle weakness (generalized): Secondary | ICD-10-CM

## 2023-10-05 NOTE — Therapy (Signed)
OUTPATIENT PHYSICAL THERAPY NEURO TREATMENT   Patient Name: Priscilla Wilson MRN: [id number] DOB:10/10/1951, 72 y.o., female Today's Date: 10/05/2023   PCP: Priscilla Hazel, MD REFERRING PROVIDER: Mariam Dollar, PA-C  END OF SESSION:  PT End of Session - 10/05/23 1100     Visit Number 10    Number of Visits 13    Date for PT Re-Evaluation 10/30/23    Authorization Type HTA    PT Start Time 1100    PT Stop Time 1147    PT Time Calculation (min) 47 min    Equipment Utilized During Treatment Gait belt    Activity Tolerance Patient tolerated treatment well               History reviewed. No pertinent past medical history. Past Surgical History:  Procedure Laterality Date   BREAST EXCISIONAL BIOPSY Left    benign   Patient Active Problem List   Diagnosis Date Noted   Essential hypertension 08/13/2023   Alteration of sensations, post-stroke 08/13/2023   Acute CVA (cerebrovascular accident) (HCC) 08/10/2023   TIA (transient ischemic attack) 08/03/2023   Stroke determined by clinical assessment (HCC) 08/03/2023    ONSET DATE: 08/14/23 referral  REFERRING DIAG: I63.9 (ICD-10-CM) - Cerebral infarction, unspecified   THERAPY DIAG:  Other lack of coordination  Muscle weakness (generalized)  Unsteadiness on feet  Rationale for Evaluation and Treatment: Rehabilitation  SUBJECTIVE:                                                                                                                                                                                             SUBJECTIVE STATEMENT:  Patient presented to physical therapy session with RW and husband. Denied falls and near falls. Reported gait has been more smooth the past couple of days. Still getting around the home with AD well and taking RW for community distances.   Pt accompanied by: significant other, Priscilla Wilson  PERTINENT HISTORY: none on file  PAIN:  Are you having pain? Yes: NPRS scale: 2/10 Pain location:  ankles, hands Pain description: arthritis Aggravating factors: does not know Relieving factors: does not know  PRECAUTIONS: Fall   WEIGHT BEARING RESTRICTIONS: No  FALLS: Has patient fallen in last 6 months? No some near falls when walking in her house, using furniture to regain balance  LIVING ENVIRONMENT: Lives with: lives with their family Lives in: House/apartment Stairs: Yes: Internal: flight steps; on right going up and External: 4 steps; bilateral but cannot reach both Has following equipment at home: Dan Humphreys - 2 wheeled, shower chair, and Grab bars  PLOF: Requires assistive device for independence  PATIENT GOALS: "my  left leg needs work"  OBJECTIVE:  Note: Objective measures were completed at Evaluation unless otherwise noted.  DIAGNOSTIC FINDINGS: 08/04/23 Brain MRI IMPRESSION: 1. 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 Associated mild petechial blood products without frank hemorrhagic transformation or significant regional mass effect. 2. Underlying moderately advanced chronic microvascular ischemic disease.  COGNITION: Overall cognitive status: Within functional limits for tasks assessed   SENSATION: Intermittent areas of numbness in L hemibody  COORDINATION: L LE dysmetric and ataxic heel/shin and figure 8   POSTURE: No Significant postural limitations  LOWER EXTREMITY MMT:    MMT Right Eval Left Eval  Hip flexion 5 4  Hip extension    Hip abduction 5 4  Hip adduction 5 4  Hip internal rotation    Hip external rotation    Knee flexion 5 4  Knee extension 5 4  Ankle dorsiflexion 5 4  Ankle plantarflexion 5 4  Ankle inversion    Ankle eversion    (Blank rows = not tested)  BED MOBILITY:  Independent   TRANSFERS: Assistive device utilized: None  Sit to stand: SBA Stand to sit: SBA Chair to chair: SBA  STAIRS: Level of Assistance: SBA Stair Negotiation Technique: Step to Pattern with Single Rail on Right Number of Stairs:  4  Height of Stairs: 6   GAIT: Gait pattern: step through pattern, decreased step length- Left, decreased stance time- Left, decreased hip/knee flexion- Left, circumduction- Left, Left steppage, Left foot flat, ataxic, wide BOS, and poor foot clearance- Left Distance walked: clinic Assistive device utilized: Environmental consultant - 2 wheeled and None Level of assistance: Modified independence and SBA    TODAY'S TREATMENT:  TherAct    Vitals:   10/05/23 1103  BP: 138/74  Pulse: 69   Gait: - 115' no AD, CGA  - 345' SPC, CGA   - trial SPC for community distances   Parallel Bars: - Fwd step ups, bilat  - Lat step ups, L LE only (R ankle pain today)  - Trailed single leg stance, caused ankle pain   Seated: - adduction ball squeezes  - hamstring curls blue theraband    PATIENT EDUCATION:  Education details: Continue HEP Person educated: Patient and Spouse Education method: Explanation Education comprehension: verbalized understanding and needs further education  HOME EXERCISE PROGRAM:  Verbally added banded LAQ and seated march overs on 10/28 (pt declined handout)  Handout of quadruped clamshells and heel sits on 11/7  Handout of step ups on 11/25  GOALS: Goals reviewed with patient? Yes  SHORT TERM GOALS: Target date: 10/02/23  Pt will be independent with initial HEP for improved functional strength and balance.  Baseline: to be provided 10/01/23: verbalized compliance  Goal status: MET  2.  Pt will improve TUG to </= 15 secs to demonstrated reduced fall risk.  Baseline: 17.5s no AD 10/01/23: 12.75 sec no AD  Goal status: MET  3.  Pt will improve 5x STS to </= 10 sec to demo improved functional LE strength and balance.   Baseline: 11.6s no UE 10/01/23: 12.69 sec no UE  Goal status: NOT MET  4.  Pt will improve gait speed to >/= .75m/s m/s to demonstrate improved community ambulation  Baseline: .44m/s  10/01/23: 1.08 m/s no AD Goal status: MET LTG  5.  Pt will improve  FGA to >/= 8/30 to demonstrate improved balance and reduced fall risk. Baseline: 3/30  10/01/23: 23/30  Goal status: MET LTG   LONG TERM GOALS: Target date: 10/30/23  Pt  will be independent with initial HEP for improved functional strength and balance  Baseline: to be provided Goal status: INITIAL  Pt will improve TUG to </= 12 secs to demonstrated reduced fall risk  Baseline: 17.5s no AD Goal status: INITIAL   Pt will improve gait speed to >/= .6 m/s to demonstrate improved community ambulation  Baseline: .56m/s Goal status: INITIAL  Pt will improve FGA to >/= 13/30 to demonstrate improved balance and reduced fall risk Baseline: 3 Goal status: INITIAL  ASSESSMENT:  CLINICAL IMPRESSION:  Patient see for skilled physical therapy session with emphases on gait and LE strength and stability. She did well with SPC and educated her and her husband she could use one for community ambulation instead of RW. Forward step ups went well on both legs. She verbalized feeling it more on her L LE. Lateral step ups went well on L LE. She verbalized pain in her R ankle when trying to do exercise on the R so we did not continue on the R. We tried single leg stance which also started to bother her R ankle. She would benefit from balance and stability exercises; she does well with tall kneeling and half kneeling stability exercises. Added step ups exercise to HEP. Continue POC.    OBJECTIVE IMPAIRMENTS: Abnormal gait, decreased balance, decreased coordination, decreased endurance, decreased knowledge of condition, decreased mobility, difficulty walking, decreased strength, impaired sensation, impaired tone, and impaired UE functional use.   ACTIVITY LIMITATIONS: carrying, lifting, squatting, bed mobility, reach over head, hygiene/grooming, locomotion level, and caring for others  PARTICIPATION LIMITATIONS: meal prep, cleaning, interpersonal relationship, driving, shopping, and community  activity  PERSONAL FACTORS: Age, Time since onset of injury/illness/exacerbation, and Transportation are also affecting patient's functional outcome.   REHAB POTENTIAL: Good  CLINICAL DECISION MAKING: Stable/uncomplicated  EVALUATION COMPLEXITY: Low  PLAN:  PT FREQUENCY: 2x/week  PT DURATION: 6 weeks  PLANNED INTERVENTIONS: 97164- PT Re-evaluation, 97110-Therapeutic exercises, 97530- Therapeutic activity, 97112- Neuromuscular re-education, 97535- Self Care, 32440- Manual therapy, [phone]- Gait training, [phone]- Orthotic Fit/training, [phone]- Canalith repositioning, [phone]- Aquatic Therapy, Patient/Family education, Balance training, Stair training, Dry Needling, Vestibular training, Visual/preceptual remediation/compensation, and DME instructions  PLAN FOR NEXT SESSION: Monitor vitals. Add to HEP, L LE coordination, stairs, perturbations, gait training, quadruped, tall kneeling, line dancing, compliant surfaces    Kaelee Pfeffer M Ogden Handlin, SPT 10/05/2023, 12:39 PM

## 2023-10-06 ENCOUNTER — Ambulatory Visit: Payer: PPO | Attending: Cardiology

## 2023-10-06 DIAGNOSIS — I639 Cerebral infarction, unspecified: Secondary | ICD-10-CM

## 2023-10-07 ENCOUNTER — Ambulatory Visit: Payer: PPO | Admitting: Physical Therapy

## 2023-10-07 VITALS — BP 117/77 | HR 72

## 2023-10-07 DIAGNOSIS — R29898 Other symptoms and signs involving the musculoskeletal system: Secondary | ICD-10-CM

## 2023-10-07 DIAGNOSIS — M6281 Muscle weakness (generalized): Secondary | ICD-10-CM

## 2023-10-07 DIAGNOSIS — R29818 Other symptoms and signs involving the nervous system: Secondary | ICD-10-CM

## 2023-10-07 DIAGNOSIS — R278 Other lack of coordination: Secondary | ICD-10-CM | POA: Diagnosis not present

## 2023-10-07 DIAGNOSIS — R2681 Unsteadiness on feet: Secondary | ICD-10-CM

## 2023-10-07 NOTE — Therapy (Signed)
OUTPATIENT PHYSICAL THERAPY NEURO TREATMENT   Patient Name: Priscilla Wilson MRN: [id number] DOB:1951/05/05, 72 y.o., female Today's Date: 10/07/2023   PCP: Sigmund Hazel, MD REFERRING PROVIDER: Mariam Dollar, PA-C  END OF SESSION:  PT End of Session - 10/07/23 1102     Visit Number 11    Number of Visits 13    Date for PT Re-Evaluation 10/30/23    Authorization Type HTA    PT Start Time 1103    PT Stop Time 1143    PT Time Calculation (min) 40 min    Equipment Utilized During Treatment Gait belt    Activity Tolerance Patient tolerated treatment well                No past medical history on file. Past Surgical History:  Procedure Laterality Date   BREAST EXCISIONAL BIOPSY Left    benign   Patient Active Problem List   Diagnosis Date Noted   Essential hypertension 08/13/2023   Alteration of sensations, post-stroke 08/13/2023   Acute CVA (cerebrovascular accident) (HCC) 08/10/2023   TIA (transient ischemic attack) 08/03/2023   Stroke determined by clinical assessment (HCC) 08/03/2023    ONSET DATE: 08/14/23 referral  REFERRING DIAG: I63.9 (ICD-10-CM) - Cerebral infarction, unspecified   THERAPY DIAG:  Muscle weakness (generalized)  Unsteadiness on feet  Other symptoms and signs involving the musculoskeletal system  Other symptoms and signs involving the nervous system  Rationale for Evaluation and Treatment: Rehabilitation  SUBJECTIVE:                                                                                                                                                                                             SUBJECTIVE STATEMENT:  Patient presented to physical therapy session with personal [facility] and husband. Denied falls and near falls. Felt "a little sore" after doing the step ups, feels limited by her arthritis because they took her off those medications when she had the stroke. She has seen her rheumatologist since then and started a  medication for this, but it has not had enough time to have an effect.   Hands and feet will bother her with arthritis most frequently, occasionally R knee.  Her driveway at home is steep, reports difficulty ascending and is concerned about trying descending.     Pt accompanied by: significant other, [name]  PERTINENT HISTORY: none on file  PAIN:  Are you having pain? Yes: NPRS scale: 3/10 Pain location: ankles, hands Pain description: arthritis Aggravating factors: does not know, describes inversion and eversion Relieving factors: does not know, "it just depends how I move it"  PRECAUTIONS: Fall  WEIGHT BEARING RESTRICTIONS: No  FALLS: Has patient fallen in last 6 months? No some near falls when walking in her house, using furniture to regain balance  LIVING ENVIRONMENT: Lives with: lives with their family Lives in: House/apartment Stairs: Yes: Internal: flight steps; on right going up and External: 4 steps; bilateral but cannot reach both Has following equipment at home: Walker - 2 wheeled, shower chair, and Grab bars  PLOF: Requires assistive device for independence  PATIENT GOALS: "my left leg needs work"  OBJECTIVE:  Note: Objective measures were completed at Evaluation unless otherwise noted.  DIAGNOSTIC FINDINGS: 08/04/23 Brain MRI IMPRESSION: 1. 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 Associated mild petechial blood products without frank hemorrhagic transformation or significant regional mass effect. 2. Underlying moderately advanced chronic microvascular ischemic disease.  COGNITION: Overall cognitive status: Within functional limits for tasks assessed   SENSATION: Intermittent areas of numbness in L hemibody  COORDINATION: L LE dysmetric and ataxic heel/shin and figure 8   POSTURE: No Significant postural limitations  LOWER EXTREMITY MMT:    MMT Right Eval Left Eval  Hip flexion 5 4  Hip extension    Hip abduction 5 4  Hip  adduction 5 4  Hip internal rotation    Hip external rotation    Knee flexion 5 4  Knee extension 5 4  Ankle dorsiflexion 5 4  Ankle plantarflexion 5 4  Ankle inversion    Ankle eversion    (Blank rows = not tested)  BED MOBILITY:  Independent   TRANSFERS: Assistive device utilized: None  Sit to stand: SBA Stand to sit: SBA Chair to chair: SBA  STAIRS: Level of Assistance: SBA Stair Negotiation Technique: Step to Pattern with Single Rail on Right Number of Stairs: 4  Height of Stairs: 6   GAIT: Gait pattern: step through pattern, decreased step length- Left, decreased stance time- Left, decreased hip/knee flexion- Left, circumduction- Left, Left steppage, Left foot flat, ataxic, wide BOS, and poor foot clearance- Left Distance walked: clinic Assistive device utilized: Environmental consultant - 2 wheeled and None Level of assistance: Modified independence and SBA    TODAY'S TREATMENT:   TherAct   Vitals:   10/07/23 1112  BP: 117/77  Pulse: 72   Indoors ramp practice x4 repetitions ascending, x4 repetitions descending w/ pt's SPC, CGA  Gait Training Outdoors ambulation Gait pattern: step through pattern, decreased step length- Right, decreased stance time- Left, decreased hip/knee flexion- Left, and decreased ankle dorsiflexion- Left Distance walked: >2,059ft Assistive device utilized: Single point cane Level of assistance: CGA Comments: increased bilateral ankle instability as pt fatigues over various surfaces including tile, thresholds, rubber mats, sidewalk, inclines and declines, and grass  Improving step size throughout ambulation session RPE at completion: 10/10 "especially on the grass"   PATIENT EDUCATION:  Education details: Continue HEP Person educated: Patient and Spouse Education method: Explanation Education comprehension: verbalized understanding and needs further education  HOME EXERCISE PROGRAM:  Verbally added banded LAQ and seated march overs on 10/28 (pt  declined handout)  Handout of quadruped clamshells and heel sits on 11/7  Handout of step ups on 11/25  GOALS: Goals reviewed with patient? Yes  SHORT TERM GOALS: Target date: 10/02/23  Pt will be independent with initial HEP for improved functional strength and balance.  Baseline: to be provided 10/01/23: verbalized compliance  Goal status: MET  2.  Pt will improve TUG to </= 15 secs to demonstrated reduced fall risk.  Baseline: 17.5s no AD 10/01/23: 12.75 sec no  AD  Goal status: MET  3.  Pt will improve 5x STS to </= 10 sec to demo improved functional LE strength and balance.   Baseline: 11.6s no UE 10/01/23: 12.69 sec no UE  Goal status: NOT MET  4.  Pt will improve gait speed to >/= .56m/s m/s to demonstrate improved community ambulation  Baseline: .70m/s  10/01/23: 1.08 m/s no AD Goal status: MET LTG  5.  Pt will improve FGA to >/= 8/30 to demonstrate improved balance and reduced fall risk. Baseline: 3/30  10/01/23: 23/30  Goal status: MET LTG   LONG TERM GOALS: Target date: 10/30/23  Pt will be independent with initial HEP for improved functional strength and balance  Baseline: to be provided Goal status: INITIAL  Pt will improve TUG to </= 12 secs to demonstrated reduced fall risk  Baseline: 17.5s no AD Goal status: INITIAL   Pt will improve gait speed to >/= .6 m/s to demonstrate improved community ambulation  Baseline: .10m/s Goal status: INITIAL  Pt will improve FGA to >/= 13/30 to demonstrate improved balance and reduced fall risk Baseline: 3 Goal status: INITIAL  ASSESSMENT:  CLINICAL IMPRESSION:  Emphasis of skilled PT session on ramp navigation and outdoors ambulation w/ SPC. Pt tolerated ambulation w/ SPC well, demonstrating difficulty descending>ascending ramps due to poor eccentric control, but improves with repeated practice. As pt fatigues, increased bilateral ankle instability becomes prevalent w/o LOB. Continue POC.    OBJECTIVE  IMPAIRMENTS: Abnormal gait, decreased balance, decreased coordination, decreased endurance, decreased knowledge of condition, decreased mobility, difficulty walking, decreased strength, impaired sensation, impaired tone, and impaired UE functional use.   ACTIVITY LIMITATIONS: carrying, lifting, squatting, bed mobility, reach over head, hygiene/grooming, locomotion level, and caring for others  PARTICIPATION LIMITATIONS: meal prep, cleaning, interpersonal relationship, driving, shopping, and community activity  PERSONAL FACTORS: Age, Time since onset of injury/illness/exacerbation, and Transportation are also affecting patient's functional outcome.   REHAB POTENTIAL: Good  CLINICAL DECISION MAKING: Stable/uncomplicated  EVALUATION COMPLEXITY: Low  PLAN:  PT FREQUENCY: 2x/week  PT DURATION: 6 weeks  PLANNED INTERVENTIONS: [id number]- PT Re-evaluation, 97110-Therapeutic exercises, 97530- Therapeutic activity, 97112- Neuromuscular re-education, 97535- Self Care, [id number]- Manual therapy, [phone]- Gait training, [phone]- Orthotic Fit/training, [phone]- Canalith repositioning, [phone]- Aquatic Therapy, Patient/Family education, Balance training, Stair training, Dry Needling, Vestibular training, Visual/preceptual remediation/compensation, and DME instructions  PLAN FOR NEXT SESSION: Monitor vitals. Add to HEP, L LE coordination, stairs, perturbations, gait training, quadruped, tall kneeling, line dancing, compliant surfaces, gentle grassy hills outdoors, curb practice w/ SPC, dual tasking ambulation w/ SPC, + UE coordination for ataxia  Beverely Low, SPT  10/07/2023, 2:02 PM

## 2023-10-13 ENCOUNTER — Ambulatory Visit: Payer: PPO | Attending: Physician Assistant | Admitting: Occupational Therapy

## 2023-10-13 DIAGNOSIS — R208 Other disturbances of skin sensation: Secondary | ICD-10-CM | POA: Insufficient documentation

## 2023-10-13 DIAGNOSIS — R278 Other lack of coordination: Secondary | ICD-10-CM | POA: Diagnosis not present

## 2023-10-13 DIAGNOSIS — E7800 Pure hypercholesterolemia, unspecified: Secondary | ICD-10-CM | POA: Diagnosis not present

## 2023-10-13 DIAGNOSIS — R2681 Unsteadiness on feet: Secondary | ICD-10-CM | POA: Insufficient documentation

## 2023-10-13 DIAGNOSIS — E0590 Thyrotoxicosis, unspecified without thyrotoxic crisis or storm: Secondary | ICD-10-CM | POA: Diagnosis not present

## 2023-10-13 DIAGNOSIS — R29818 Other symptoms and signs involving the nervous system: Secondary | ICD-10-CM | POA: Insufficient documentation

## 2023-10-13 DIAGNOSIS — R29898 Other symptoms and signs involving the musculoskeletal system: Secondary | ICD-10-CM | POA: Diagnosis not present

## 2023-10-13 DIAGNOSIS — M6281 Muscle weakness (generalized): Secondary | ICD-10-CM | POA: Diagnosis not present

## 2023-10-13 DIAGNOSIS — R7303 Prediabetes: Secondary | ICD-10-CM | POA: Diagnosis not present

## 2023-10-13 DIAGNOSIS — Z9989 Dependence on other enabling machines and devices: Secondary | ICD-10-CM | POA: Diagnosis not present

## 2023-10-13 DIAGNOSIS — I69354 Hemiplegia and hemiparesis following cerebral infarction affecting left non-dominant side: Secondary | ICD-10-CM | POA: Diagnosis not present

## 2023-10-13 DIAGNOSIS — M0579 Rheumatoid arthritis with rheumatoid factor of multiple sites without organ or systems involvement: Secondary | ICD-10-CM | POA: Diagnosis not present

## 2023-10-13 DIAGNOSIS — I10 Essential (primary) hypertension: Secondary | ICD-10-CM | POA: Diagnosis not present

## 2023-10-13 NOTE — Therapy (Signed)
OUTPATIENT OCCUPATIONAL THERAPY Treatment and Progress Note  Patient Name: Priscilla Wilson MRN: [id number] DOB:1950/12/19, 72 y.o., female Today's Date: 10/13/2023  PCP: Sigmund Hazel, MD  REFERRING PROVIDER: Charlton Amor, PA-C   END OF SESSION:  OT End of Session - 10/13/23 1540     Visit Number 10    Number of Visits 12    Date for OT Re-Evaluation 10/23/23    Authorization Type [employer] - follows Medicare guidelines    Progress Note Due on Visit 10    OT Start Time 1449    OT Stop Time 1530    OT Time Calculation (min) 41 min    Activity Tolerance Patient tolerated treatment well    Behavior During Therapy [facility] for tasks assessed/performed                 No past medical history on file. Past Surgical History:  Procedure Laterality Date   BREAST EXCISIONAL BIOPSY Left    benign   Patient Active Problem List   Diagnosis Date Noted   Essential hypertension 08/13/2023   Alteration of sensations, post-stroke 08/13/2023   Acute CVA (cerebrovascular accident) (HCC) 08/10/2023   TIA (transient ischemic attack) 08/03/2023   Stroke determined by clinical assessment (HCC) 08/03/2023   ONSET DATE: 08/14/23 (referral date), 08/03/23 (date of CVA)  REFERRING DIAG: I63.9 (ICD-10-CM) - Cerebral infarction, unspecified   THERAPY DIAG:  Other lack of coordination  Muscle weakness (generalized)  Other symptoms and signs involving the nervous system  Other symptoms and signs involving the musculoskeletal system  Other disturbances of skin sensation  Rationale for Evaluation and Treatment: Rehabilitation  SUBJECTIVE:   SUBJECTIVE STATEMENT: Pt reports things are going well. Pt reports completing shoulder HEP. Pt denies soreness after shoulder HEP. Pt reported some pain of L shoulder d/t previous injury of L shoulder though pt completes exercises previously prescribed by PT many years ago to address those symptoms and then symptoms resolve.  Pt  accompanied by: self and significant other (husband: [name])  PERTINENT HISTORY: Acute CVA, Essential HTN, alteration of sensation post-stroke, hyperthyroidism, asthma, hyperlipidemia, GERD, pre-diabetes  Per 08/21/23 D/C Summary: "Tija Guilliams is a 72 y.o. female presented to the [facility] ED on 08/03/2023 with sudden onset of left-sided numbness and weakness... 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 2D echo LVEF 70-75%. Hgb A1C 6.3%."   PRECAUTIONS: None Pt reported "they say I'm not allowed to drive" since stroke.  WEIGHT BEARING RESTRICTIONS: No  PAIN:  Are you having pain? Pt reports 1/10 pain at L ankle d/t arthritis pain.  FALLS: Has patient fallen in last 6 months? No  LIVING ENVIRONMENT: Lives with: lives with their spouse Lives in: House/apartment Stairs: Yes: Internal: 16 steps; on left going up and External: 3 steps; bilateral but cannot reach both - Pt reported not needing to go upstairs during the day because all appliances and room accessible at home. Has following equipment at home: Dan Humphreys - 2 wheeled, shower chair, and Grab bars  PLOF: Independent, pt driving, pt retired  PATIENT GOALS: Pt reported "I don't have all my feeling back in L arm, but it's come a long way." "I want to be able to do all the things I did before." Pt reports wanting to get back to line dancing and driving, to "get rid of walker," and to "make Christmas cookies.  OBJECTIVE:  Note: Objective measures  were completed at Evaluation unless otherwise noted.  HAND DOMINANCE: Right  ADLs: Overall ADLs: ind, sometimes with supervision for safety Transfers/ambulation related to ADLs: ind with supervision Eating: ind Grooming: ind UB Dressing: ind, no difficulty with hooking bra, manipulating buttons, zippers LB Dressing: ind Toileting: ind with  grab bar next to toilet Bathing: ind Tub Shower transfers: no difficulty; with supervision  Equipment: Walk in shower and walk-in tub  IADLs: Shopping: dependent Light housekeeping: have not attempted some tasks (e.g. laundry, dishwashing), ind wiping table and pt reports she is "pretty sure I can do [employer], putting dishes in dishwasher]". Meal Prep: currently dependent because husband wants to complete tasks. Pt reports she may be able to complete cutting tasks. Pt reports difficulty carrying heavy objects, such as pan or tray.  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 Pt's spouse reports pt has some unsteadiness "from time-to-time" though reports it has improved since stroke. Pt reports some difficulty with balance when moving during some tasks (e.g. attempting to stand on one foot for LB dressing while leaning against bed). Pt generally uses the walker during the day.  POSTURE COMMENTS:  Ind, no difficulty , supports self   ACTIVITY TOLERANCE: Activity tolerance: Pt reported increased fatigue during the day, such as from re-learning tasks since the stroke, walking far distances, and completing hand exercises.  FUNCTIONAL OUTCOME MEASURES: FOTO: 55    UPPER EXTREMITY ROM:    Active ROM Right eval Left eval  Shoulder flexion [facility]  Shoulder abduction    Shoulder adduction    Shoulder extension    Shoulder internal rotation    Shoulder external rotation    Elbow flexion    Elbow extension    Wrist flexion    Wrist extension    Wrist ulnar deviation    Wrist radial deviation    Wrist pronation    Wrist supination    (Blank rows = not tested)  Note: OT noted slightly slower movements of LUE compared to RUE.  UPPER EXTREMITY MMT:     MMT Right eval Left eval  Shoulder flexion 4 4-  Shoulder  abduction 4 4-  Shoulder adduction    Shoulder extension    Shoulder internal rotation    Shoulder external rotation    Middle trapezius    Lower trapezius    Elbow flexion    Elbow extension    Wrist flexion    Wrist extension    Wrist ulnar deviation    Wrist radial deviation    Wrist pronation    Wrist supination    (Blank rows = not tested)  HAND FUNCTION: Grip strength: Right: 34.9 lbs; Left: 43.8 lbs - average of 3 trials  OT noted increased shaking of LUE compared to RUE when pt grasped dynamometer with LUE.  COORDINATION: 9 Hole Peg test: Right: 18 sec; Left: 64 sec  Several drops with LUE, shaking of LUE.  SENSATION: Pt reports tips of fingers have difficulty with grading of force: e.g. "I can't tell how tight I'm holding something."   Pt reports impaired sensation of LUE "Like there is a veil over my left arm." Pt reports numbness of L axilla. Pt reports "tightness" of L bicep.  EDEMA: None noted  MUSCLE TONE: none noted  COGNITION: Overall cognitive status: Within functional limits for tasks assessed  VISION: Subjective report: Pt reports no changes in vision. Pt  correctly read clock on wall. Baseline vision: Wears glasses all the time, Wears glasses for distance and reading. Visual history: cataracts - pt reports doctor says cataracts are "starting" though no treatment yet.  VISION ASSESSMENT: Not tested Appeared [facility] when completing other assessment tasks   Patient has difficulty with following activities due to following visual impairments: none noted.  PERCEPTION: Not tested  PRAXIS: Not tested  OBSERVATIONS: Pt accompanied by husband. Pt appeared well-kept. Pt ambulated with 2-wheeled rolling walker with slow altered gait.   TODAY'S TREATMENT:                                                                                                                              DATE:   TherAct OT assessed pt's progress towards goals, see below for  updates.   TrueBalance with LUE - 3 min. timed with elbow tucked in at side, 2 min. timed with elbow extended - to improve LUE FM coordination and proprioception  Placing/removing pegs from grooved pegboard - to improve LUE FM coordination and dexterity. Pt benefited from v/c to maintain upright seated posture during task d/t compensatory movements of leaning to R side when manipulating objects with LUE.  Catching and tossing a weighted ball (500 g) with L elbow ext - to improve LUE FM coordination, gross motor control, strength, and proprioception   PATIENT EDUCATION: Education details: See today's treatment above, body mechanics/ergonomics, proximal stability for distal mobility, additional coordination activities to complete at home, purpose of OT session tasks Person educated: Patient and Spouse Education method: Explanation, Demonstration, Tactile cues, Verbal cues, and Handouts Education comprehension: verbalized understanding and returned demonstration  HOME EXERCISE PROGRAM: 09/03/23 - Shoulder and theraputty. Access Code: [email or web address] 09/07/23 - Coordination HEP (handout, see pt instructions) 09/10/23 - Sensory precautions and sensory activities (handout, see pt instructions) 09/17/23 - Yellow Theraband. Access Code: [facility] 10/01/23 - Updated Shoulder HEP - Access Code: [email or web address]   GOALS: Goals reviewed with patient? Yes  SHORT TERM GOALS: Target date: 09/25/23  Patient will demonstrate improved FM coordination by completing nine-hole peg with use of LUE in 53 seconds or less with no more than 3 drops. Baseline: Left: 64 sec, Several drops with LUE 09/22/23 - Left: 43 seconds, 1 drop Goal status: 09/22/23 - MET  2.  Pt will demonstrate independence with initial HEP for coordination and theraputty of LUE. Baseline: New to outpt OT 09/22/23 - Pt demo'd ind with HEP Goal status: 09/22/23 - MET  3. Patient will demonstrate at least 5 lbs increase of LUE grip strength (average 3  trials) and stable LUE grasp as needed to open jars and other containers.  Baseline: Left: 43.8 lbs, shaking of LUE when grasping dynamometer 09/22/23 - 50.7 lbs average, relatively stable grasp with LUE (mild shaking)  Goal status: 09/22/23 - MET  4. Pt will report little difficulty picking a coin up from the table using LUE.  Baseline: Per FOTO, pt picks up coins from table "  with much difficulty" 09/22/23 - Pt easily picked up x5 pennies and placed in slot with LUE. Pt reported no diffculty with picking up coins. Goal status: 09/22/23 - MET   LONG TERM GOALS: Target date: 10/23/23  Patient will demonstrate improved FM coordination by completing nine-hole peg with use of LUE in 40 seconds or less with no more than 1 drop. Baseline: Left: 64 sec, Several drops with LUE 10/13/23 - Left: 36 seconds, 0 drops Goal status: 10/13/23 - MET and then revised (see below)   1 REVISED. Patient will demonstrate improved FM coordination by completing nine-hole peg with use of LUE in 32 seconds or less with no drops. Baseline: Left: 64 sec, Several drops with LUE 10/13/23 - Left: 36 seconds, 0 drops Goal status: 10/13/23 - goal revised, in progress  2.  Pt will demonstrate independence with updated HEP PRN. Baseline: New to outpt OT 10/13/23 - Pt reported completing current HEP ind, including shoulder AROM, theraputty, and coordination. Pt demo'd understanding of many exercises ind. Goal status: in progress  3.  Pt will participate in cooking/baking task safely at mod I level. Baseline: pt has not attempted cooking/baking tasks since stroke 10/13/23 - Pt reported cooking/baking tasks going well. Pt reported increased difficulty carrying tray later in the day though can adapt with adaptive strategies to complete lifting task (e.g. placing left hand under tray to help stabilize tray). Pt reported completing cooking tasks with supervision from spouse, especially when taking items in/out of oven. Goal status:  in progress  4.  Pt will report no more than mild difficulty with carrying an item in her left, affected hand to improve participation in light household tasks (e.g. dishwashing, laundry). Baseline: pt reports difficulty carrying heavy items 10/13/23 - Pt reported no difficulty with carrying clothing items in L affected hand by carrying clothes close to body based on body mechanics/ergonomics principles. Pt reported continuing to avoid using left hand for heavy plates and dishware d/t safety concerns though picking up silverware with LUE without difficulty. Goal status: in progress  5.  Pt will complete D/C FOTO Baseline: Intake FOTO completed at eval Goal status: to complete at D/C  ASSESSMENT:  CLINICAL IMPRESSION: Pt tolerated tasks well today. Pt would continue to benefit from skilled OT services in the outpatient setting to work on impairments as noted below to help pt return to PLOF as able.     Occupational Therapy Progress Note  This 10th progress note is for dates: 08/27/23 to 10/13/2023. Pt has met 4 out of 4 STGs and 1 out of 5 LTGs. OT revised pt's coordination LTG d/t pt meeting goal and making excellent progress with LUE coordination. Pt making progress towards goals as expected and continues to benefit from skilled OT services in the outpatient setting to work towards remaining goals or until max rehab potential is met.    PERFORMANCE DEFICITS: in functional skills including ADLs, IADLs, coordination, dexterity, proprioception, sensation, ROM, strength, Fine motor control, Gross motor control, balance, body mechanics, endurance, and UE functional use, cognitive skills including energy/drive, and psychosocial skills including environmental adaptation.   IMPAIRMENTS: are limiting patient from ADLs, IADLs, leisure, and social participation.   CO-MORBIDITIES: may have co-morbidities  that affects occupational performance. Patient will benefit from skilled OT to address above  impairments and improve overall function.  REHAB POTENTIAL: Good  PLAN:  OT FREQUENCY: 1-2x/week  (2x per week initially, progress to 1x per week PRN if pt makes good progress)  OT DURATION: 6 weeks (  POC extended 2 weeks to allow for scheduling)  PLANNED INTERVENTIONS: 97168 OT Re-evaluation, 97535 self care/ADL training, 16109 therapeutic exercise, 97530 therapeutic activity, 97112 neuromuscular re-education, 97140 manual therapy, 97035 ultrasound, 97018 paraffin, 60454 fluidotherapy, 97010 moist heat, 97010 cryotherapy, 97032 electrical stimulation (manual), 97760 Orthotics management and training, 09811 Splinting (initial encounter), [id number] Subsequent splinting/medication, manual lymph drainage, passive range of motion, functional mobility training, visual/perceptual remediation/compensation, energy conservation, patient/family education, and DME and/or AE instructions  RECOMMENDED OTHER SERVICES: PT eval re-scheduled  CONSULTED AND AGREED WITH PLAN OF CARE: Patient and family member/caregiver  PLAN FOR NEXT SESSION:   Consider re-cert or D/C on 10/20/23 OT visit - discuss with pt  Higher level coordination tasks  Body mechanics/ergonomics  Functional simulated tasks for standing tolerance - kitchen, dishwashing, laundry  UE strengthening tasks - update theraputty and theraband PRN  [employer] - standing with gait belt  Review HEP PRN   Wynetta Emery, OT 10/13/2023, 3:43 PM

## 2023-10-15 ENCOUNTER — Ambulatory Visit: Payer: PPO | Admitting: Occupational Therapy

## 2023-10-15 ENCOUNTER — Ambulatory Visit: Payer: PPO | Admitting: Physical Therapy

## 2023-10-15 VITALS — BP 117/65 | HR 70

## 2023-10-15 DIAGNOSIS — M6281 Muscle weakness (generalized): Secondary | ICD-10-CM

## 2023-10-15 DIAGNOSIS — R29818 Other symptoms and signs involving the nervous system: Secondary | ICD-10-CM

## 2023-10-15 DIAGNOSIS — R278 Other lack of coordination: Secondary | ICD-10-CM

## 2023-10-15 DIAGNOSIS — R2681 Unsteadiness on feet: Secondary | ICD-10-CM

## 2023-10-15 DIAGNOSIS — R29898 Other symptoms and signs involving the musculoskeletal system: Secondary | ICD-10-CM

## 2023-10-15 DIAGNOSIS — R208 Other disturbances of skin sensation: Secondary | ICD-10-CM

## 2023-10-15 NOTE — Therapy (Signed)
OUTPATIENT PHYSICAL THERAPY NEURO TREATMENT   Patient Name: Priscilla Wilson MRN: [id number] DOB:[birth year], 72 y.o., female Today's Date: 10/15/2023   PCP: Sigmund Hazel, MD REFERRING PROVIDER: Mariam Dollar, PA-C  END OF SESSION:  PT End of Session - 10/15/23 1405     Visit Number 12    Number of Visits 13    Date for PT Re-Evaluation 10/30/23    Authorization Type HTA    PT Start Time 1404    PT Stop Time 1442    PT Time Calculation (min) 38 min    Equipment Utilized During Treatment Gait belt    Activity Tolerance Patient tolerated treatment well    Behavior During Therapy WFL for tasks assessed/performed                No past medical history on file. Past Surgical History:  Procedure Laterality Date   BREAST EXCISIONAL BIOPSY Left    benign   Patient Active Problem List   Diagnosis Date Noted   Essential hypertension 08/13/2023   Alteration of sensations, post-stroke 08/13/2023   Acute CVA (cerebrovascular accident) (HCC) 08/10/2023   TIA (transient ischemic attack) 08/03/2023   Stroke determined by clinical assessment (HCC) 08/03/2023    ONSET DATE: 08/14/23 referral  REFERRING DIAG: I63.9 (ICD-10-CM) - Cerebral infarction, unspecified   THERAPY DIAG:  Other lack of coordination  Muscle weakness (generalized)  Unsteadiness on feet  Rationale for Evaluation and Treatment: Rehabilitation  SUBJECTIVE:                                                                                                                                                                                             SUBJECTIVE STATEMENT:  Patient presented to physical therapy session with personal [facility] and husband. Denied falls and near falls. Tried walking her driveway and states it went well. States it was harder than walking on flat ground, but she did it more than once. Wants to work on reaching to the ground and lifting objects.   Pt accompanied by: significant other,  [name]  PERTINENT HISTORY: none on file  PAIN:  Are you having pain? No  PRECAUTIONS: Fall   WEIGHT BEARING RESTRICTIONS: No  FALLS: Has patient fallen in last 6 months? No some near falls when walking in her house, using furniture to regain balance  LIVING ENVIRONMENT: Lives with: lives with their family Lives in: House/apartment Stairs: Yes: Internal: flight steps; on right going up and External: 4 steps; bilateral but cannot reach both Has following equipment at home: Dan Humphreys - 2 wheeled, shower chair, and Grab bars  PLOF: Requires assistive device for independence  PATIENT  GOALS: "my left leg needs work"  OBJECTIVE:  Note: Objective measures were completed at Evaluation unless otherwise noted.  DIAGNOSTIC FINDINGS: 08/04/23 Brain MRI IMPRESSION: 1. 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 Associated mild petechial blood products without frank hemorrhagic transformation or significant regional mass effect. 2. Underlying moderately advanced chronic microvascular ischemic disease.  COGNITION: Overall cognitive status: Within functional limits for tasks assessed   SENSATION: Intermittent areas of numbness in L hemibody  COORDINATION: L LE dysmetric and ataxic heel/shin and figure 8   POSTURE: No Significant postural limitations  LOWER EXTREMITY MMT:    MMT Right Eval Left Eval  Hip flexion 5 4  Hip extension    Hip abduction 5 4  Hip adduction 5 4  Hip internal rotation    Hip external rotation    Knee flexion 5 4  Knee extension 5 4  Ankle dorsiflexion 5 4  Ankle plantarflexion 5 4  Ankle inversion    Ankle eversion    (Blank rows = not tested)  BED MOBILITY:  Independent   TRANSFERS: Assistive device utilized: None  Sit to stand: SBA Stand to sit: SBA Chair to chair: SBA  STAIRS: Level of Assistance: SBA Stair Negotiation Technique: Step to Pattern with Single Rail on Right Number of Stairs: 4  Height of Stairs: 6   GAIT: Gait  pattern: step through pattern, decreased step length- Left, decreased stance time- Left, decreased hip/knee flexion- Left, circumduction- Left, Left steppage, Left foot flat, ataxic, wide BOS, and poor foot clearance- Left Distance walked: clinic Assistive device utilized: Walker - 2 wheeled and None Level of assistance: Modified independence and SBA  VITALS  Vitals:   10/15/23 1412  BP: 117/65  Pulse: 70      TODAY'S TREATMENT:   TherAct   Assessed vitals (see above) and WNL   NMR  In hallway, performed side stepping cone drill w/stack and reach to work on proper lifting technique and lateral weight shifting. Pt performed well w/SBA, no LOB noted.  6 Blaze pods on random reach setting for improved squatting technique, anticipatory balance strategies and retro gait.  Performed on 2.5 minute intervals with 1 minute rest periods.  Pt requires SBA guarding. Round 1:  semi-circle setup w/one pod on 6" step and one on 4" step.  17 hits. Round 2:  same setup w/posterior resistance applied at pelvis w/orange resistance band Maralyn Sago).  15 hits. One single lateral LOB to R side when reaching for far R pod, which pt able to self-correct.   Farmer's carries w/single 15# KB, x115' per side. Min cues to slow down, as pt's LLE ataxic w/activity. CGA throughout. Pt reported being very fatigued but did improve stability when slowing down. No major LOB noted.    PATIENT EDUCATION:  Education details: Continue HEP, clearance to start doing more at home (pt reports frustration with others catering to her as she feels she can do some things herself) Person educated: Patient and Spouse Education method: Explanation Education comprehension: verbalized understanding and needs further education  HOME EXERCISE PROGRAM:  Verbally added banded LAQ and seated march overs on 10/28 (pt declined handout)  Handout of quadruped clamshells and heel sits on 11/7  Handout of step ups on 11/25  GOALS: Goals  reviewed with patient? Yes  SHORT TERM GOALS: Target date: 10/02/23  Pt will be independent with initial HEP for improved functional strength and balance.  Baseline: to be provided 10/01/23: verbalized compliance  Goal status: MET  2.  Pt will  improve TUG to </= 15 secs to demonstrated reduced fall risk.  Baseline: 17.5s no AD 10/01/23: 12.75 sec no AD  Goal status: MET  3.  Pt will improve 5x STS to </= 10 sec to demo improved functional LE strength and balance.   Baseline: 11.6s no UE 10/01/23: 12.69 sec no UE  Goal status: NOT MET  4.  Pt will improve gait speed to >/= .39m/s m/s to demonstrate improved community ambulation  Baseline: .67m/s  10/01/23: 1.08 m/s no AD Goal status: MET LTG  5.  Pt will improve FGA to >/= 8/30 to demonstrate improved balance and reduced fall risk. Baseline: 3/30  10/01/23: 23/30  Goal status: MET LTG   LONG TERM GOALS: Target date: 10/30/23  Pt will be independent with initial HEP for improved functional strength and balance  Baseline: to be provided Goal status: INITIAL  Pt will improve TUG to </= 12 secs to demonstrated reduced fall risk  Baseline: 17.5s no AD Goal status: INITIAL   Pt will improve gait speed to >/= .6 m/s to demonstrate improved community ambulation  Baseline: .69m/s Goal status: INITIAL  Pt will improve FGA to >/= 13/30 to demonstrate improved balance and reduced fall risk Baseline: 3 Goal status: INITIAL  ASSESSMENT:  CLINICAL IMPRESSION:  Emphasis of skilled PT session on proper lifting technique, anticipatory balance strategies and lateral weight shifting. Pt reported wanting to work on lifting items from the ground, as her family does not think she is safe to perform. Entirety of session spent w/pt holding items and reaching to floor with no LOB. Encouraged pt to start reintegrating into household tasks at home w/S* to facilitate return to PLOF. Continue POC.    OBJECTIVE IMPAIRMENTS: Abnormal gait,  decreased balance, decreased coordination, decreased endurance, decreased knowledge of condition, decreased mobility, difficulty walking, decreased strength, impaired sensation, impaired tone, and impaired UE functional use.   ACTIVITY LIMITATIONS: carrying, lifting, squatting, bed mobility, reach over head, hygiene/grooming, locomotion level, and caring for others  PARTICIPATION LIMITATIONS: meal prep, cleaning, interpersonal relationship, driving, shopping, and community activity  PERSONAL FACTORS: Age, Time since onset of injury/illness/exacerbation, and Transportation are also affecting patient's functional outcome.   REHAB POTENTIAL: Good  CLINICAL DECISION MAKING: Stable/uncomplicated  EVALUATION COMPLEXITY: Low  PLAN:  PT FREQUENCY: 2x/week  PT DURATION: 6 weeks  PLANNED INTERVENTIONS: 97164- PT Re-evaluation, 97110-Therapeutic exercises, 97530- Therapeutic activity, 97112- Neuromuscular re-education, 97535- Self Care, 42595- Manual therapy, [phone]- Gait training, [phone]- Orthotic Fit/training, [phone]- Canalith repositioning, [phone]- Aquatic Therapy, Patient/Family education, Balance training, Stair training, Dry Needling, Vestibular training, Visual/preceptual remediation/compensation, and DME instructions  PLAN FOR NEXT SESSION: Monitor vitals. Add to HEP, L LE coordination, stairs, perturbations, gait training, quadruped, tall kneeling, line dancing, compliant surfaces, gentle grassy hills outdoors, curb practice w/ SPC, dual tasking ambulation w/ SPC, + UE coordination for ataxia  Shamir Sedlar E Anice Wilshire, PT, DPT  10/15/2023, 2:42 PM

## 2023-10-15 NOTE — Therapy (Signed)
OUTPATIENT OCCUPATIONAL THERAPY Treatment  Patient Name: Priscilla Wilson MRN: [id number] DOB:1951-09-01, 72 y.o., female Today's Date: 10/15/2023  PCP: Sigmund Hazel, MD  REFERRING PROVIDER: Charlton Amor, PA-C   END OF SESSION:  OT End of Session - 10/15/23 1458     Visit Number 11    Number of Visits 12    Date for OT Re-Evaluation 10/23/23    Authorization Type [employer] - follows Medicare guidelines    Progress Note Due on Visit 10    OT Start Time 1317    OT Stop Time 1358    OT Time Calculation (min) 41 min    Equipment Utilized During Treatment gait belt    Activity Tolerance Patient tolerated treatment well    Behavior During Therapy WFL for tasks assessed/performed                  No past medical history on file. Past Surgical History:  Procedure Laterality Date   BREAST EXCISIONAL BIOPSY Left    benign   Patient Active Problem List   Diagnosis Date Noted   Essential hypertension 08/13/2023   Alteration of sensations, post-stroke 08/13/2023   Acute CVA (cerebrovascular accident) (HCC) 08/10/2023   TIA (transient ischemic attack) 08/03/2023   Stroke determined by clinical assessment (HCC) 08/03/2023   ONSET DATE: 08/14/23 (referral date), 08/03/23 (date of CVA)  REFERRING DIAG: I63.9 (ICD-10-CM) - Cerebral infarction, unspecified   THERAPY DIAG:  Other lack of coordination  Muscle weakness (generalized)  Other symptoms and signs involving the nervous system  Other symptoms and signs involving the musculoskeletal system  Other disturbances of skin sensation  Rationale for Evaluation and Treatment: Rehabilitation  SUBJECTIVE:   SUBJECTIVE STATEMENT: Pt reported no problems and things are going okay. Pt ambulated using single-point cane.  Pt accompanied by: self and significant other (husband: [name])  PERTINENT HISTORY: Acute CVA, Essential HTN, alteration of sensation post-stroke, hyperthyroidism, asthma, hyperlipidemia, GERD,  pre-diabetes  Per 08/21/23 D/C Summary: "Priscilla Wilson is a 72 y.o. female presented to the [facility] ED on 08/03/2023 with sudden onset of left-sided numbness and weakness... 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 2D echo LVEF 70-75%. Hgb A1C 6.3%."   PRECAUTIONS: None Pt reported "they say I'm not allowed to drive" since stroke.  WEIGHT BEARING RESTRICTIONS: No  PAIN:  Are you having pain? Pt reports 0/10 pain  FALLS: Has patient fallen in last 6 months? No  LIVING ENVIRONMENT: Lives with: lives with their spouse Lives in: House/apartment Stairs: Yes: Internal: 16 steps; on left going up and External: 3 steps; bilateral but cannot reach both - Pt reported not needing to go upstairs during the day because all appliances and room accessible at home. Has following equipment at home: Dan Humphreys - 2 wheeled, shower chair, and Grab bars  PLOF: Independent, pt driving, pt retired  PATIENT GOALS: Pt reported "I don't have all my feeling back in L arm, but it's come a long way." "I want to be able to do all the things I did before." Pt reports wanting to get back to line dancing and driving, to "get rid of walker," and to "make Christmas cookies.  OBJECTIVE:  Note: Objective measures were completed at Evaluation unless otherwise noted.  HAND DOMINANCE: Right  ADLs: Overall ADLs: ind, sometimes with supervision for safety Transfers/ambulation related to ADLs: ind with supervision Eating: ind Grooming: ind UB Dressing:  ind, no difficulty with hooking bra, manipulating buttons, zippers LB Dressing: ind Toileting: ind with grab bar next to toilet Bathing: ind Tub Shower transfers: no difficulty; with supervision  Equipment: Walk in shower and walk-in tub  IADLs: Shopping: dependent Light housekeeping: have not attempted some tasks  (e.g. laundry, dishwashing), ind wiping table and pt reports she is "pretty sure I can do [employer], putting dishes in dishwasher]". Meal Prep: currently dependent because husband wants to complete tasks. Pt reports she may be able to complete cutting tasks. Pt reports difficulty carrying heavy objects, such as pan or tray.  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 Pt's spouse reports pt has some unsteadiness "from time-to-time" though reports it has improved since stroke. Pt reports some difficulty with balance when moving during some tasks (e.g. attempting to stand on one foot for LB dressing while leaning against bed). Pt generally uses the walker during the day.  POSTURE COMMENTS:  Ind, no difficulty , supports self   ACTIVITY TOLERANCE: Activity tolerance: Pt reported increased fatigue during the day, such as from re-learning tasks since the stroke, walking far distances, and completing hand exercises.  FUNCTIONAL OUTCOME MEASURES: FOTO: 55    UPPER EXTREMITY ROM:    Active ROM Right eval Left eval  Shoulder flexion [facility]  Shoulder abduction    Shoulder adduction    Shoulder extension    Shoulder internal rotation    Shoulder external rotation    Elbow flexion    Elbow extension    Wrist flexion    Wrist extension    Wrist ulnar deviation    Wrist radial deviation    Wrist pronation    Wrist supination    (Blank rows = not tested)  Note: OT noted slightly slower movements of LUE compared to RUE.  UPPER EXTREMITY MMT:     MMT Right eval Left eval  Shoulder flexion 4 4-  Shoulder abduction 4 4-  Shoulder adduction    Shoulder extension    Shoulder internal rotation    Shoulder external rotation    Middle trapezius    Lower trapezius    Elbow flexion    Elbow extension    Wrist flexion    Wrist  extension    Wrist ulnar deviation    Wrist radial deviation    Wrist pronation    Wrist supination    (Blank rows = not tested)  HAND FUNCTION: Grip strength: Right: 34.9 lbs; Left: 43.8 lbs - average of 3 trials  OT noted increased shaking of LUE compared to RUE when pt grasped dynamometer with LUE.  COORDINATION: 9 Hole Peg test: Right: 18 sec; Left: 64 sec  Several drops with LUE, shaking of LUE.  SENSATION: Pt reports tips of fingers have difficulty with grading of force: e.g. "I can't tell how tight I'm holding something."   Pt reports impaired sensation of LUE "Like there is a veil over my left arm." Pt reports numbness of L axilla. Pt reports "tightness" of L bicep.  EDEMA: None noted  MUSCLE TONE: none noted  COGNITION: Overall cognitive status: Within functional limits for tasks assessed  VISION: Subjective report: Pt reports no changes in vision. Pt correctly read clock on wall. Baseline vision: Wears glasses all the time, Wears glasses for distance and reading. Visual history: cataracts - pt reports doctor says cataracts are "starting" though no treatment yet.  VISION ASSESSMENT: Not tested Appeared [facility] when completing other assessment tasks   Patient has difficulty with following activities due to following visual impairments: none noted.  PERCEPTION: Not tested  PRAXIS: Not tested  OBSERVATIONS: Pt accompanied by husband. Pt appeared well-kept. Pt ambulated with 2-wheeled rolling walker with slow altered gait.   TODAY'S TREATMENT:                                                                                                                              DATE:   TherAct Placing/removing resistive clips - 2 sets of x5 yellow clips, x6 red clips, x7 green clips, x7 blue clips, and x7 black clips - standing with gait belt, supervision A - to improve LUE pinch strengthening and FM coordination, to improve LUE functional reach, to improve standing tolerance  and dynamic standing balance. Pt tolerated task well and demo'd good control with functional reach though OT noted some "swaying" of pt's LUE with more distal reach of LUE.  Placing/removing "Edwina Barth" (suction cups) at upright mirror and building structures with x3 to x4 Squigz at upright mirror - standing with gait belt, supervisionA -  to improve LUE strengthening and FM coordination, to improve in-hand manipulation and dexterity of LUE, to improve LUE functional reach, to improve standing tolerance and dynamic standing balance. Pt tolerated task well though demo'd more frequent drops near end of task.   PATIENT EDUCATION: Education details: See today's treatment above, body mechanics/ergonomics, proximal stability for distal mobility, standing posture Person educated: Patient and Spouse Education method: Explanation, Demonstration, Tactile cues, Verbal cues, and Handouts Education comprehension: verbalized understanding and returned demonstration  HOME EXERCISE PROGRAM: 09/03/23 - Shoulder and theraputty. Access Code: [email or web address] 09/07/23 - Coordination HEP (handout, see pt instructions) 09/10/23 - Sensory precautions and sensory activities (handout, see pt instructions) 09/17/23 - Yellow Theraband. Access Code: [facility] 10/01/23 - Updated Shoulder HEP - Access Code: [email or web address]   GOALS: Goals reviewed with patient? Yes  SHORT TERM GOALS: Target date: 09/25/23  Patient will demonstrate improved FM coordination by completing nine-hole peg with use of LUE in 53 seconds or less with no more than 3 drops. Baseline: Left: 64 sec, Several drops with LUE 09/22/23 - Left: 43 seconds, 1 drop Goal status: 09/22/23 - MET  2.  Pt will demonstrate independence with initial HEP for coordination and theraputty of LUE. Baseline: New to outpt OT 09/22/23 - Pt demo'd ind with HEP Goal status: 09/22/23 - MET  3. Patient will demonstrate at least 5 lbs increase of LUE grip strength (average 3 trials) and  stable LUE grasp as needed to open jars and other containers.  Baseline: Left: 43.8 lbs, shaking of LUE when grasping dynamometer 09/22/23 - 50.7 lbs average, relatively stable grasp with LUE (mild shaking)  Goal status: 09/22/23 - MET  4. Pt will report little difficulty picking a coin up from the table using LUE.  Baseline: Per FOTO, pt picks up coins from table "with  much difficulty" 09/22/23 - Pt easily picked up x5 pennies and placed in slot with LUE. Pt reported no diffculty with picking up coins. Goal status: 09/22/23 - MET   LONG TERM GOALS: Target date: 10/23/23  Patient will demonstrate improved FM coordination by completing nine-hole peg with use of LUE in 40 seconds or less with no more than 1 drop. Baseline: Left: 64 sec, Several drops with LUE 10/13/23 - Left: 36 seconds, 0 drops Goal status: 10/13/23 - MET and then revised (see below)   1 REVISED. Patient will demonstrate improved FM coordination by completing nine-hole peg with use of LUE in 32 seconds or less with no drops. Baseline: Left: 64 sec, Several drops with LUE 10/13/23 - Left: 36 seconds, 0 drops Goal status: 10/13/23 - goal revised, in progress  2.  Pt will demonstrate independence with updated HEP PRN. Baseline: New to outpt OT 10/13/23 - Pt reported completing current HEP ind, including shoulder AROM, theraputty, and coordination. Pt demo'd understanding of many exercises ind. Goal status: in progress  3.  Pt will participate in cooking/baking task safely at mod I level. Baseline: pt has not attempted cooking/baking tasks since stroke 10/13/23 - Pt reported cooking/baking tasks going well. Pt reported increased difficulty carrying tray later in the day though can adapt with adaptive strategies to complete lifting task (e.g. placing left hand under tray to help stabilize tray). Pt reported completing cooking tasks with supervision from spouse, especially when taking items in/out of oven. Goal status: in  progress  4.  Pt will report no more than mild difficulty with carrying an item in her left, affected hand to improve participation in light household tasks (e.g. dishwashing, laundry). Baseline: pt reports difficulty carrying heavy items 10/13/23 - Pt reported no difficulty with carrying clothing items in L affected hand by carrying clothes close to body based on body mechanics/ergonomics principles. Pt reported continuing to avoid using left hand for heavy plates and dishware d/t safety concerns though picking up silverware with LUE without difficulty. Goal status: in progress  5.  Pt will complete D/C FOTO Baseline: Intake FOTO completed at eval Goal status: to complete at D/C  ASSESSMENT:  CLINICAL IMPRESSION: Pt tolerated tasks well today. Recommended to continue to target functional reach and coordination of LUE. Pt would continue to benefit from skilled OT services in the outpatient setting to work on impairments as noted below to help pt return to PLOF as able.     PERFORMANCE DEFICITS: in functional skills including ADLs, IADLs, coordination, dexterity, proprioception, sensation, ROM, strength, Fine motor control, Gross motor control, balance, body mechanics, endurance, and UE functional use, cognitive skills including energy/drive, and psychosocial skills including environmental adaptation.   IMPAIRMENTS: are limiting patient from ADLs, IADLs, leisure, and social participation.   CO-MORBIDITIES: may have co-morbidities  that affects occupational performance. Patient will benefit from skilled OT to address above impairments and improve overall function.  REHAB POTENTIAL: Good  PLAN:  OT FREQUENCY: 1-2x/week  (2x per week initially, progress to 1x per week PRN if pt makes good progress)  OT DURATION: 6 weeks (POC extended 2 weeks to allow for scheduling)  PLANNED INTERVENTIONS: 97168 OT Re-evaluation, 97535 self care/ADL training, 28413 therapeutic exercise, 97530 therapeutic  activity, 97112 neuromuscular re-education, 97140 manual therapy, 97035 ultrasound, 97018 paraffin, 24401 fluidotherapy, 97010 moist heat, 97010 cryotherapy, 97032 electrical stimulation (manual), [id number] Orthotics management and training, 02725 Splinting (initial encounter), [id number] Subsequent splinting/medication, manual lymph drainage, passive range of motion, functional mobility training, visual/perceptual remediation/compensation,  energy conservation, patient/family education, and DME and/or AE instructions  RECOMMENDED OTHER SERVICES: PT eval re-scheduled  CONSULTED AND AGREED WITH PLAN OF CARE: Patient and family member/caregiver  PLAN FOR NEXT SESSION:   Consider re-cert or D/C on 10/20/23 OT visit - discuss with pt  Flex bar for UE strengthening  Higher level coordination tasks  Review body mechanics/ergonomics  Functional simulated tasks for standing tolerance - kitchen, dishwashing, laundry  Blaze Pods - standing with gait belt  Review HEP PRN, Update theraputty and theraband PRN   Wynetta Emery, OT 10/15/2023, 3:03 PM

## 2023-10-20 ENCOUNTER — Ambulatory Visit: Payer: PPO | Admitting: Occupational Therapy

## 2023-10-20 ENCOUNTER — Ambulatory Visit: Payer: PPO

## 2023-10-20 DIAGNOSIS — R278 Other lack of coordination: Secondary | ICD-10-CM

## 2023-10-20 DIAGNOSIS — R29818 Other symptoms and signs involving the nervous system: Secondary | ICD-10-CM

## 2023-10-20 DIAGNOSIS — M6281 Muscle weakness (generalized): Secondary | ICD-10-CM

## 2023-10-20 DIAGNOSIS — R208 Other disturbances of skin sensation: Secondary | ICD-10-CM

## 2023-10-20 DIAGNOSIS — R29898 Other symptoms and signs involving the musculoskeletal system: Secondary | ICD-10-CM

## 2023-10-20 NOTE — Therapy (Signed)
OUTPATIENT OCCUPATIONAL THERAPY Treatment / Re-Certification  Patient Name: Priscilla Wilson MRN: [id number] DOB:1951-09-05, 72 y.o., female Today's Date: 10/20/2023  PCP: Sigmund Hazel, MD  REFERRING PROVIDER: Charlton Amor, PA-C   END OF SESSION:  OT End of Session - 10/20/23 1456     Visit Number 12    Number of Visits 19   initial 12 visits + 7 additional visits   Date for OT Re-Evaluation 11/13/23    Authorization Type [employer] - follows Medicare guidelines    Progress Note Due on Visit 20    OT Start Time 1403    OT Stop Time 1450    OT Time Calculation (min) 47 min    Activity Tolerance Patient tolerated treatment well    Behavior During Therapy [facility] for tasks assessed/performed                   No past medical history on file. Past Surgical History:  Procedure Laterality Date   BREAST EXCISIONAL BIOPSY Left    benign   Patient Active Problem List   Diagnosis Date Noted   Essential hypertension 08/13/2023   Alteration of sensations, post-stroke 08/13/2023   Acute CVA (cerebrovascular accident) (HCC) 08/10/2023   TIA (transient ischemic attack) 08/03/2023   Stroke determined by clinical assessment (HCC) 08/03/2023   ONSET DATE: 08/14/23 (referral date), 08/03/23 (date of CVA)  REFERRING DIAG: I63.9 (ICD-10-CM) - Cerebral infarction, unspecified   THERAPY DIAG:  Other lack of coordination  Muscle weakness (generalized)  Other symptoms and signs involving the nervous system  Other symptoms and signs involving the musculoskeletal system  Other disturbances of skin sensation  Rationale for Evaluation and Treatment: Rehabilitation  SUBJECTIVE:   SUBJECTIVE STATEMENT: Pt reported things are going well. Pt reported completing HEP.  Pt c/o increased ataxic LUE movements in the morning compared to later in the day. Pt reported still not having complete feeling in fingertips for sensation.   Pt accompanied by: self and significant  other (husband: [name])  PERTINENT HISTORY: Acute CVA, Essential HTN, alteration of sensation post-stroke, hyperthyroidism, asthma, hyperlipidemia, GERD, pre-diabetes  Per 08/21/23 D/C Summary: "Priscilla Wilson is a 72 y.o. female presented to the [facility] ED on 08/03/2023 with sudden onset of left-sided numbness and weakness... 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 2D echo LVEF 70-75%. Hgb A1C 6.3%."   PRECAUTIONS: None Pt reported "they say I'm not allowed to drive" since stroke.  WEIGHT BEARING RESTRICTIONS: No  PAIN:  Are you having pain? Pt reported no pain today. Pt reported having pain right after waking up though pain resolved after hot shower and walking around.  FALLS: Has patient fallen in last 6 months? No  LIVING ENVIRONMENT: Lives with: lives with their spouse Lives in: House/apartment Stairs: Yes: Internal: 16 steps; on left going up and External: 3 steps; bilateral but cannot reach both - Pt reported not needing to go upstairs during the day because all appliances and room accessible at home. Has following equipment at home: Dan Humphreys - 2 wheeled, shower chair, and Grab bars  PLOF: Independent, pt driving, pt retired  PATIENT GOALS: Pt reported "I don't have all my feeling back in L arm, but it's come a long way." "I want to be able to do all the things I did before." Pt reports wanting to get back to line dancing and driving, to "get rid of walker," and  to "make Christmas cookies.  OBJECTIVE:  Note: Objective measures were completed at Evaluation unless otherwise noted.  HAND DOMINANCE: Right  ADLs: Overall ADLs: ind, sometimes with supervision for safety Transfers/ambulation related to ADLs: ind with supervision Eating: ind Grooming: ind UB Dressing: ind, no difficulty with hooking bra, manipulating buttons,  zippers LB Dressing: ind Toileting: ind with grab bar next to toilet Bathing: ind Tub Shower transfers: no difficulty; with supervision  Equipment: Walk in shower and walk-in tub  IADLs: Shopping: dependent Light housekeeping: have not attempted some tasks (e.g. laundry, dishwashing), ind wiping table and pt reports she is "pretty sure I can do [employer], putting dishes in dishwasher]". Meal Prep: currently dependent because husband wants to complete tasks. Pt reports she may be able to complete cutting tasks. Pt reports difficulty carrying heavy objects, such as pan or tray.  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 Pt's spouse reports pt has some unsteadiness "from time-to-time" though reports it has improved since stroke. Pt reports some difficulty with balance when moving during some tasks (e.g. attempting to stand on one foot for LB dressing while leaning against bed). Pt generally uses the walker during the day.  POSTURE COMMENTS:  Ind, no difficulty , supports self   ACTIVITY TOLERANCE: Activity tolerance: Pt reported increased fatigue during the day, such as from re-learning tasks since the stroke, walking far distances, and completing hand exercises.  FUNCTIONAL OUTCOME MEASURES: FOTO: 55    UPPER EXTREMITY ROM:    Active ROM Right eval Left eval  Shoulder flexion [facility]  Shoulder abduction    Shoulder adduction    Shoulder extension    Shoulder internal rotation    Shoulder external rotation    Elbow flexion    Elbow extension    Wrist flexion    Wrist extension    Wrist ulnar deviation    Wrist radial deviation    Wrist pronation    Wrist supination    (Blank rows = not tested)  Note: OT noted slightly slower movements of LUE compared to RUE.  UPPER EXTREMITY MMT:     MMT Right eval  Left eval  Shoulder flexion 4 4-  Shoulder abduction 4 4-  Shoulder adduction    Shoulder extension    Shoulder internal rotation    Shoulder external rotation    Middle trapezius    Lower trapezius    Elbow flexion    Elbow extension    Wrist flexion    Wrist extension    Wrist ulnar deviation    Wrist radial deviation    Wrist pronation    Wrist supination    (Blank rows = not tested)  HAND FUNCTION: Grip strength: Right: 34.9 lbs; Left: 43.8 lbs - average of 3 trials  OT noted increased shaking of LUE compared to RUE when pt grasped dynamometer with LUE.  COORDINATION: 9 Hole Peg test: Right: 18 sec; Left: 64 sec  Several drops with LUE, shaking of LUE.  SENSATION: Pt reports tips of fingers have difficulty with grading of force: e.g. "I can't tell how tight I'm holding something."   Pt reports impaired sensation of LUE "Like there is a veil over my left arm." Pt reports numbness of L axilla. Pt reports "tightness" of L bicep.  EDEMA: None noted  MUSCLE TONE: none noted  COGNITION: Overall cognitive status: Within functional limits for tasks assessed  VISION: Subjective report: Pt reports no changes in vision. Pt correctly read clock on wall. Baseline vision: Wears glasses all the time, Wears glasses for distance and reading. Visual history: cataracts - pt reports doctor says cataracts are "starting" though no treatment yet.  VISION ASSESSMENT: Not tested Appeared [facility] when completing other assessment tasks   Patient has difficulty with following activities due to following visual impairments: none noted.  PERCEPTION: Not tested  PRAXIS: Not tested  OBSERVATIONS: Pt accompanied by husband. Pt appeared well-kept. Pt ambulated with 2-wheeled rolling walker with slow altered gait.   TODAY'S TREATMENT:                                                                                                                              DATE:   TherAct OT assessed  pt's progress towards goals, see below for updates.   Pt and OT discussed D/C or re-certification for additional OT visits. Pt stated preference to continue with OT sessions to continue to target ind with cooking tasks, strengthening of LUE, coordination of LUE, and decreasing instances of "shaking" (ataxic movements) of LUE during the course of the day. OT revised goals and reviewed goals with pt. Pt agreed to updated/revised goals.  OT educated pt on completing affected UE shoulder stretches first thing in morning to decrease "weakness" symptoms upon first waking up. Pt verbalized understanding. OT educated pt on UE anatomy, prognosis, safety d/t decreased sensation of affected UE, core stability for distal mobility, and overall progress towards goals. Pt verbalized understanding of all.  TherEx:  OT updated pt's theraband HEP to completing exercises with Red Theraband (increased resistance). Pt tolerated increased resistance for theraband UE exercises well.  Access Code: [email or web address] URL: [email or web address] Date: 10/20/2023 Prepared by: Carilyn Goodpasture  Exercises with LUE - to increase LUE strengthening, coordination, proprioception, FM and GM manual control: - Seated Elbow Flexion with Self-Anchored Resistance  -1 sets - 10 reps - Seated Shoulder Diagonal Pulls with Resistance  - 1 sets - 10 reps - Seated Shoulder Flexion with Self-Anchored Resistance  - 1 sets - 10 reps - Seated Shoulder Horizontal Abduction with Resistance  - 1 sets - 10 reps - Seated Bilateral Shoulder External Rotation with Resistance  - 1 sets - 10 reps   PATIENT EDUCATION: Education details: See today's treatment above Person educated: Patient and Spouse Education method: Explanation, Demonstration, Tactile cues, Verbal cues, and Handouts Education comprehension: verbalized understanding and returned demonstration  HOME EXERCISE PROGRAM: 09/03/23 - Shoulder and theraputty. Access Code:  [email or web address] 09/07/23 - Coordination HEP (handout, see pt instructions) 09/10/23 - Sensory precautions and sensory activities (handout, see pt instructions) 09/17/23 - Yellow Theraband. Access Code: [facility] 10/01/23 - Updated Shoulder HEP - Access Code: [email or web address] 10/20/23 - Updated to red Theraband - Access Code: [email or web address] (same as theraband above)   GOALS: Goals reviewed with patient? Yes  SHORT TERM GOALS: Target date: 09/25/23  Patient will demonstrate improved FM coordination by completing nine-hole  peg with use of LUE in 53 seconds or less with no more than 3 drops. Baseline: Left: 64 sec, Several drops with LUE 09/22/23 - Left: 43 seconds, 1 drop Goal status: 09/22/23 - MET  2.  Pt will demonstrate independence with initial HEP for coordination and theraputty of LUE. Baseline: New to outpt OT 09/22/23 - Pt demo'd ind with HEP Goal status: 09/22/23 - MET  3. Patient will demonstrate at least 5 lbs increase of LUE grip strength (average 3 trials) and stable LUE grasp as needed to open jars and other containers.  Baseline: Left: 43.8 lbs, shaking of LUE when grasping dynamometer 09/22/23 - 50.7 lbs average, relatively stable grasp with LUE (mild shaking)  Goal status: 09/22/23 - MET  4. Pt will report little difficulty picking a coin up from the table using LUE.  Baseline: Per FOTO, pt picks up coins from table "with much difficulty" 09/22/23 - Pt easily picked up x5 pennies and placed in slot with LUE. Pt reported no diffculty with picking up coins. Goal status: 09/22/23 - MET   LONG TERM GOALS: Target date: 11/13/23 (dates extended d/t additional OT visits requested on 10/20/23)  Patient will demonstrate improved FM coordination by completing nine-hole peg with use of LUE in 40 seconds or less with no more than 1 drop. Baseline: Left: 64 sec, Several drops with LUE 10/13/23 - Left: 36 seconds, 0 drops Goal status: 10/13/23 - MET and then revised (see below)   1 REVISED. Patient  will demonstrate improved FM coordination by completing nine-hole peg with use of LUE in 32 seconds or less with no drops. Baseline: Left: 64 sec, Several drops with LUE 10/13/23 - Left: 36 seconds, 0 drops 10/20/23 - Trial 1 - Left: 31, 1 drop, Trial 2 - Left: 28 seconds, 0 drops Goal status: 10/13/23 - goal revised, 10/20/23 - MET  2.  Pt will report no more than mild difficulty with carrying an item in her left, affected hand to improve participation in light household tasks (e.g. dishwashing, laundry). Baseline: pt reports difficulty carrying heavy items 10/13/23 - Pt reported no difficulty with carrying clothing items in L affected hand by carrying clothes close to body based on body mechanics/ergonomics principles. Pt reported continuing to avoid using left hand for heavy plates and dishware d/t safety concerns though picking up silverware with LUE without difficulty. 10/20/23  - Pt reported no issue carrying light items in L hand.  Pt reported continuing dropping objects with L hand though not as much as before. Pt reported not dropping items when completing laundry or dishwashing tasks. Goal status: 10/20/23 - MET  3.  Pt will demonstrate independence with updated HEP PRN. Baseline: New to outpt OT 10/13/23 - Pt reported completing current HEP ind, including shoulder AROM, theraputty, and coordination. Pt demo'd understanding of many exercises ind. 10/20/23 - Pt confirmed no questions or concerns about current HEP. HEP will continue to update PRN as pt participates in additional OT sessions. Goal status: in progress  4.  Pt will participate in cooking/baking task safely at mod I level. Baseline: pt has not attempted cooking/baking tasks since stroke 10/13/23 - Pt reported cooking/baking tasks going well. Pt reported increased difficulty carrying tray later in the day though can adapt with adaptive strategies to complete lifting task (e.g. placing left hand under tray to help stabilize tray).  Pt reported completing cooking tasks with supervision from spouse, especially when taking items in/out of oven. 10/20/23 - Pt reported cooking/baking "going very well."  Pt reported being extra cautious d/t decreased sensation of affected UE. However, pt reported loading dishwasher and moving items in kitchen without much difficulty. Pt reported increased difficulty in morning because affected UE feels weaker in morning. Pt is still concerned about taking heavier items out of oven d/t safety concerns. Goal status: 10/20/23 - in progress  5.  Pt will complete D/C FOTO Baseline: Intake FOTO completed at eval Goal status: to complete at D/C  6. Updated/revised 10/20/23 - Patient will demonstrate at least 50 lbs of LUE grip strength (average 3 trials) and stable LUE grasp (without "shaking") as needed to open jars and other containers.  Baseline: Left: 43.8 lbs, shaking of LUE when grasping dynamometer 09/22/23 - 50.7 lbs average, relatively stable grasp with LUE (mild shaking) 10/20/23 - 45.6 lbs average of 3 trials with stable grasp of LUE, Pt c/o arthritis pain today with possibly increased symptoms d/t rainy weather today.  Goal status: updated 10/20/23, in progress  7. Updated/revised 10/20/23 - Patient will demonstrate improved FM coordination by completing nine-hole peg with use of LUE in 24 seconds or less with no drops. Baseline: Left: 64 sec, Several drops with LUE 10/13/23 - Left: 36 seconds, 0 drops 10/20/23 - Trial 1 - Left: 31, 1 drop, Trial 2 - Left: 28 seconds, 0 drops Goal status - updated/revised 10/20/23, In progress  8. Added 10/20/23 - Pt will demo improved coordination of LUE as evidenced by pt reporting no more than mild ataxic ("shaking") movements of affected UE when grasping items with elbow extended for functional forward reach throughout the whole day. Baseline: Pt reported increased ataxic movements of LUE in morning (moderate ataxic movements in morning) which improve  during the course of the day (mild ataxic movements in the afternoon).   Goals status: added 10/20/23, in progress  ASSESSMENT:  CLINICAL IMPRESSION: Pt met 100% of STG and 2 LTG and an additional revised LTG. Pt making good progress and demo'ing improved coordination and functional use of LUE. Pt stated preference to continue with OT sessions to continue to target ind with cooking tasks, strengthening of LUE, coordination of LUE, and decreasing instances of "shaking" (ataxic movements) of LUE during the course of the day. OT revised goals and reviewed updated goals with pt. Pt agreed to updated/revised goals. Pt tolerated increased resistance for theraband HEP well today. Recommended to continue to target functional reach and coordination of LUE. Pt would continue to benefit from skilled OT services in the outpatient setting to work on impairments as noted below to help pt return to PLOF as able.     PERFORMANCE DEFICITS: in functional skills including ADLs, IADLs, coordination, dexterity, proprioception, sensation, ROM, strength, Fine motor control, Gross motor control, balance, body mechanics, endurance, and UE functional use, cognitive skills including energy/drive, and psychosocial skills including environmental adaptation.   IMPAIRMENTS: are limiting patient from ADLs, IADLs, leisure, and social participation.   CO-MORBIDITIES: may have co-morbidities  that affects occupational performance. Patient will benefit from skilled OT to address above impairments and improve overall function.  REHAB POTENTIAL: Good  PLAN:  OT FREQUENCY: 1-2x/week  (2x per week initially, progress to 1x per week PRN if pt makes good progress)  OT DURATION: 6 weeks (POC extended 2 weeks to allow for scheduling) + 7 additional visits requested on 10/20/23 (2x per week for 3 additional weeks). Pt agreed to updated OT POC.  PLANNED INTERVENTIONS: 97168 OT Re-evaluation, 97535 self care/ADL training, 40981 therapeutic  exercise, 97530 therapeutic activity, 97112 neuromuscular re-education,  97140 manual therapy, 97035 ultrasound, 16109 paraffin, 97039 fluidotherapy, 97010 moist heat, 97010 cryotherapy, 97032 electrical stimulation (manual), 97760 Orthotics management and training, 97760 Splinting (initial encounter), [phone] Subsequent splinting/medication, manual lymph drainage, passive range of motion, functional mobility training, visual/perceptual remediation/compensation, energy conservation, patient/family education, and DME and/or AE instructions  RECOMMENDED OTHER SERVICES: PT eval re-scheduled  CONSULTED AND AGREED WITH PLAN OF CARE: Patient and family member/caregiver  PLAN FOR NEXT SESSION:   Functional tasks incorporating functional forward reach  Flex bar for UE strengthening  Higher level coordination tasks  Review body mechanics/ergonomics  [employer] - standing with gait belt  Review and update HEP PRN   Wynetta Emery, OT 10/20/2023, 3:29 PM

## 2023-10-22 ENCOUNTER — Ambulatory Visit: Payer: PPO | Admitting: Occupational Therapy

## 2023-10-22 ENCOUNTER — Ambulatory Visit: Payer: PPO | Admitting: Physical Therapy

## 2023-10-22 VITALS — BP 117/61 | HR 72

## 2023-10-22 DIAGNOSIS — R29818 Other symptoms and signs involving the nervous system: Secondary | ICD-10-CM

## 2023-10-22 DIAGNOSIS — R278 Other lack of coordination: Secondary | ICD-10-CM

## 2023-10-22 DIAGNOSIS — R208 Other disturbances of skin sensation: Secondary | ICD-10-CM

## 2023-10-22 DIAGNOSIS — R29898 Other symptoms and signs involving the musculoskeletal system: Secondary | ICD-10-CM

## 2023-10-22 DIAGNOSIS — M6281 Muscle weakness (generalized): Secondary | ICD-10-CM

## 2023-10-22 DIAGNOSIS — R2681 Unsteadiness on feet: Secondary | ICD-10-CM

## 2023-10-22 NOTE — Therapy (Signed)
OUTPATIENT OCCUPATIONAL THERAPY Treatment  Patient Name: Priscilla Wilson MRN: [id number] DOB:05/12/1951, 72 y.o., female Today's Date: 10/22/2023  PCP: Priscilla Hazel, MD  REFERRING PROVIDER: Charlton Amor, PA-C   END OF SESSION:  OT End of Session - 10/22/23 1506     Visit Number 13    Number of Visits 19   initial 12 visits + 7 additional visits   Date for OT Re-Evaluation 11/13/23    Authorization Type [employer] - follows Medicare guidelines    Progress Note Due on Visit 20    OT Start Time 1401    OT Stop Time 1442    OT Time Calculation (min) 41 min    Activity Tolerance Patient tolerated treatment well    Behavior During Therapy [facility] for tasks assessed/performed                    No past medical history on file. Past Surgical History:  Procedure Laterality Date   BREAST EXCISIONAL BIOPSY Left    benign   Patient Active Problem List   Diagnosis Date Noted   Essential hypertension 08/13/2023   Alteration of sensations, post-stroke 08/13/2023   Acute CVA (cerebrovascular accident) (HCC) 08/10/2023   TIA (transient ischemic attack) 08/03/2023   Stroke determined by clinical assessment (HCC) 08/03/2023   ONSET DATE: 08/14/23 (referral date), 08/03/23 (date of CVA)  REFERRING DIAG: I63.9 (ICD-10-CM) - Cerebral infarction, unspecified   THERAPY DIAG:  Other lack of coordination  Other symptoms and signs involving the nervous system  Other symptoms and signs involving the musculoskeletal system  Muscle weakness (generalized)  Other disturbances of skin sensation  Rationale for Evaluation and Treatment: Rehabilitation  SUBJECTIVE:   SUBJECTIVE STATEMENT: Pt reported LUE "was fighting me all morning" (increased LUE shaking). Pt reported using red band for theraband activities which may have contributed to increased LUE fatigue. Pt reported LUE feels okay now in the afternoon.   Pt accompanied by: self and significant other (husband:  Priscilla Wilson)  PERTINENT HISTORY: Acute CVA, Essential HTN, alteration of sensation post-stroke, hyperthyroidism, asthma, hyperlipidemia, GERD, pre-diabetes  Per 08/21/23 D/C Summary: "Priscilla Wilson is a 72 y.o. female presented to the [facility] ED on 08/03/2023 with sudden onset of left-sided numbness and weakness... 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 2D echo LVEF 70-75%. Hgb A1C 6.3%."   PRECAUTIONS: None Pt reported "they say I'm not allowed to drive" since stroke.  WEIGHT BEARING RESTRICTIONS: No  PAIN:  Are you having pain? Pt reported no pain today.   FALLS: Has patient fallen in last 6 months? No  LIVING ENVIRONMENT: Lives with: lives with their spouse Lives in: House/apartment Stairs: Yes: Internal: 16 steps; on left going up and External: 3 steps; bilateral but cannot reach both - Pt reported not needing to go upstairs during the day because all appliances and room accessible at home. Has following equipment at home: Dan Humphreys - 2 wheeled, shower chair, and Grab bars  PLOF: Independent, pt driving, pt retired  PATIENT GOALS: Pt reported "I don't have all my feeling back in L arm, but it's come a long way." "I want to be able to do all the things I did before." Pt reports wanting to get back to line dancing and driving, to "get rid of walker," and to "make Christmas cookies.  OBJECTIVE:  Note: Objective measures were completed at Evaluation unless otherwise noted.  HAND DOMINANCE: Right  ADLs: Overall ADLs: ind, sometimes with supervision for safety Transfers/ambulation related to ADLs: ind with supervision Eating: ind Grooming: ind UB Dressing: ind, no difficulty with hooking bra, manipulating buttons, zippers LB Dressing: ind Toileting: ind with grab bar next to toilet Bathing: ind Tub Shower transfers: no difficulty;  with supervision  Equipment: Walk in shower and walk-in tub  IADLs: Shopping: dependent Light housekeeping: have not attempted some tasks (e.g. laundry, dishwashing), ind wiping table and pt reports she is "pretty sure I can do [employer], putting dishes in dishwasher]". Meal Prep: currently dependent because husband wants to complete tasks. Pt reports she may be able to complete cutting tasks. Pt reports difficulty carrying heavy objects, such as pan or tray.  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 Pt's spouse reports pt has some unsteadiness "from time-to-time" though reports it has improved since stroke. Pt reports some difficulty with balance when moving during some tasks (e.g. attempting to stand on one foot for LB dressing while leaning against bed). Pt generally uses the walker during the day.  POSTURE COMMENTS:  Ind, no difficulty , supports self   ACTIVITY TOLERANCE: Activity tolerance: Pt reported increased fatigue during the day, such as from re-learning tasks since the stroke, walking far distances, and completing hand exercises.  FUNCTIONAL OUTCOME MEASURES: FOTO: 55    UPPER EXTREMITY ROM:    Active ROM Right eval Left eval  Shoulder flexion [facility]  Shoulder abduction    Shoulder adduction    Shoulder extension    Shoulder internal rotation    Shoulder external rotation    Elbow flexion    Elbow extension    Wrist flexion    Wrist extension    Wrist ulnar deviation    Wrist radial deviation    Wrist pronation    Wrist supination    (Blank rows = not tested)  Note: OT noted slightly slower movements of LUE compared to RUE.  UPPER EXTREMITY MMT:     MMT Right eval Left eval  Shoulder flexion 4 4-  Shoulder abduction 4 4-  Shoulder adduction    Shoulder extension    Shoulder internal  rotation    Shoulder external rotation    Middle trapezius    Lower trapezius    Elbow flexion    Elbow extension    Wrist flexion    Wrist extension    Wrist ulnar deviation    Wrist radial deviation    Wrist pronation    Wrist supination    (Blank rows = not tested)  HAND FUNCTION: Grip strength: Right: 34.9 lbs; Left: 43.8 lbs - average of 3 trials  OT noted increased shaking of LUE compared to RUE when pt grasped dynamometer with LUE.  COORDINATION: 9 Hole Peg test: Right: 18 sec; Left: 64 sec  Several drops with LUE, shaking of LUE.  SENSATION: Pt reports tips of fingers have difficulty with grading of force: e.g. "I can't tell how tight I'm holding something."   Pt reports impaired sensation of LUE "Like there is a veil over my left arm." Pt reports numbness of L axilla. Pt reports "tightness" of L bicep.  EDEMA: None noted  MUSCLE TONE: none noted  COGNITION: Overall cognitive status: Within functional limits for tasks assessed  VISION: Subjective report: Pt reports no changes in vision. Pt correctly read clock on wall. Baseline vision: Wears  glasses all the time, Wears glasses for distance and reading. Visual history: cataracts - pt reports doctor says cataracts are "starting" though no treatment yet.  VISION ASSESSMENT: Not tested Appeared [facility] when completing other assessment tasks   Patient has difficulty with following activities due to following visual impairments: none noted.  PERCEPTION: Not tested  PRAXIS: Not tested  OBSERVATIONS: Pt accompanied by husband. Pt appeared well-kept. Pt ambulated with 2-wheeled rolling walker with slow altered gait.   TODAY'S TREATMENT:                                                                                                                              DATE:   TherAct Wiping upright mirror with towel in big circular motions then large letter "alphabet" closed chain exercise - standing with gait belt,  supervision A - to improve affected UE coordination, functional forward reach, dynamic standing balance, standing tolerance  Catch and toss lightweight ball at rebound trampoline using BUE - standing with gait belt, CGA - to improve affected UE FM coordination, strengthening, dynamic standing balance - Pt tolerated task well. Pt reported fatigue after task though maintained standing balance throughout task.  Placing and removing pegs from grooved pegboard - to improve affected UE FM coordination and dexterity  TherEx:  Flex bar (tan) - supination, pronation, and twist (2 min each) - to increase affected UE strength, coordination, endurance and proprioception  Resistive rolling cylinders on Velcro - rolling with palm, turning with key pinch, rolling with fingertips - 1 min each - to increase affected UE strength, coordination, endurance and proprioception  Prayer stretch and reverse prayer stretch of BUE in between exercise sets PRN to decrease BUE fatigue.   PATIENT EDUCATION: Education details: purpose of therapeutic tasks Person educated: Patient and Spouse Education method: Explanation, Demonstration, Tactile cues, Verbal cues, and Handouts Education comprehension: verbalized understanding and returned demonstration  HOME EXERCISE PROGRAM: 09/03/23 - Shoulder and theraputty. Access Code: [email or web address] 09/07/23 - Coordination HEP (handout, see pt instructions) 09/10/23 - Sensory precautions and sensory activities (handout, see pt instructions) 09/17/23 - Yellow Theraband. Access Code: [facility] 10/01/23 - Updated Shoulder HEP - Access Code: [email or web address] 10/20/23 - Updated to red Theraband - Access Code: [email or web address] (same as theraband above)   GOALS: Goals reviewed with patient? Yes  SHORT TERM GOALS: Target date: 09/25/23  Patient will demonstrate improved FM coordination by completing nine-hole peg with use of LUE in 53 seconds or less with no more than 3 drops. Baseline: Left: 64 sec,  Several drops with LUE 09/22/23 - Left: 43 seconds, 1 drop Goal status: 09/22/23 - MET  2.  Pt will demonstrate independence with initial HEP for coordination and theraputty of LUE. Baseline: New to outpt OT 09/22/23 - Pt demo'd ind with HEP Goal status: 09/22/23 - MET  3. Patient will demonstrate at least 5 lbs increase of LUE grip strength (average 3 trials) and stable LUE grasp as needed to open jars and other  containers.  Baseline: Left: 43.8 lbs, shaking of LUE when grasping dynamometer 09/22/23 - 50.7 lbs average, relatively stable grasp with LUE (mild shaking)  Goal status: 09/22/23 - MET  4. Pt will report little difficulty picking a coin up from the table using LUE.  Baseline: Per FOTO, pt picks up coins from table "with much difficulty" 09/22/23 - Pt easily picked up x5 pennies and placed in slot with LUE. Pt reported no diffculty with picking up coins. Goal status: 09/22/23 - MET   LONG TERM GOALS: Target date: 11/13/23 (dates extended d/t additional OT visits requested on 10/20/23)  Patient will demonstrate improved FM coordination by completing nine-hole peg with use of LUE in 40 seconds or less with no more than 1 drop. Baseline: Left: 64 sec, Several drops with LUE 10/13/23 - Left: 36 seconds, 0 drops Goal status: 10/13/23 - MET and then revised (see below)   1 REVISED. Patient will demonstrate improved FM coordination by completing nine-hole peg with use of LUE in 32 seconds or less with no drops. Baseline: Left: 64 sec, Several drops with LUE 10/13/23 - Left: 36 seconds, 0 drops 10/20/23 - Trial 1 - Left: 31, 1 drop, Trial 2 - Left: 28 seconds, 0 drops Goal status: 10/13/23 - goal revised, 10/20/23 - MET  2.  Pt will report no more than mild difficulty with carrying an item in her left, affected hand to improve participation in light household tasks (e.g. dishwashing, laundry). Baseline: pt reports difficulty carrying heavy items 10/13/23 - Pt reported no difficulty with  carrying clothing items in L affected hand by carrying clothes close to body based on body mechanics/ergonomics principles. Pt reported continuing to avoid using left hand for heavy plates and dishware d/t safety concerns though picking up silverware with LUE without difficulty. 10/20/23  - Pt reported no issue carrying light items in L hand.  Pt reported continuing dropping objects with L hand though not as much as before. Pt reported not dropping items when completing laundry or dishwashing tasks. Goal status: 10/20/23 - MET  3.  Pt will demonstrate independence with updated HEP PRN. Baseline: New to outpt OT 10/13/23 - Pt reported completing current HEP ind, including shoulder AROM, theraputty, and coordination. Pt demo'd understanding of many exercises ind. 10/20/23 - Pt confirmed no questions or concerns about current HEP. HEP will continue to update PRN as pt participates in additional OT sessions. Goal status: in progress  4.  Pt will participate in cooking/baking task safely at mod I level. Baseline: pt has not attempted cooking/baking tasks since stroke 10/13/23 - Pt reported cooking/baking tasks going well. Pt reported increased difficulty carrying tray later in the day though can adapt with adaptive strategies to complete lifting task (e.g. placing left hand under tray to help stabilize tray). Pt reported completing cooking tasks with supervision from spouse, especially when taking items in/out of oven. 10/20/23 - Pt reported cooking/baking "going very well." Pt reported being extra cautious d/t decreased sensation of affected UE. However, pt reported loading dishwasher and moving items in kitchen without much difficulty. Pt reported increased difficulty in morning because affected UE feels weaker in morning. Pt is still concerned about taking heavier items out of oven d/t safety concerns. Goal status: 10/20/23 - in progress  5.  Pt will complete D/C FOTO Baseline: Intake FOTO completed at  eval Goal status: to complete at D/C  6. Updated/revised 10/20/23 - Patient will demonstrate at least 50 lbs of LUE grip strength (average 3 trials) and  stable LUE grasp (without "shaking") as needed to open jars and other containers.  Baseline: Left: 43.8 lbs, shaking of LUE when grasping dynamometer 09/22/23 - 50.7 lbs average, relatively stable grasp with LUE (mild shaking) 10/20/23 - 45.6 lbs average of 3 trials with stable grasp of LUE, Pt c/o arthritis pain today with possibly increased symptoms d/t rainy weather today.  Goal status: updated 10/20/23, in progress  7. Updated/revised 10/20/23 - Patient will demonstrate improved FM coordination by completing nine-hole peg with use of LUE in 24 seconds or less with no drops. Baseline: Left: 64 sec, Several drops with LUE 10/13/23 - Left: 36 seconds, 0 drops 10/20/23 - Trial 1 - Left: 31, 1 drop, Trial 2 - Left: 28 seconds, 0 drops Goal status - updated/revised 10/20/23, In progress  8. Added 10/20/23 - Pt will demo improved coordination of LUE as evidenced by pt reporting no more than mild ataxic ("shaking") movements of affected UE when grasping items with elbow extended for functional forward reach throughout the whole day. Baseline: Pt reported increased ataxic movements of LUE in morning (moderate ataxic movements in morning) which improve during the course of the day (mild ataxic movements in the afternoon).   Goals status: added 10/20/23, in progress  ASSESSMENT:  CLINICAL IMPRESSION: Pt reported increased shaking of LUE this morning possibly d/t fatigue. OT to continue to monitor tolerance for updated HEP and updated PRN. Pt tolerated therapy tasks well today. Pt would continue to benefit from skilled OT services in the outpatient setting to work on impairments as noted below to help pt return to PLOF as able.     PERFORMANCE DEFICITS: in functional skills including ADLs, IADLs, coordination, dexterity, proprioception, sensation,  ROM, strength, Fine motor control, Gross motor control, balance, body mechanics, endurance, and UE functional use, cognitive skills including energy/drive, and psychosocial skills including environmental adaptation.   IMPAIRMENTS: are limiting patient from ADLs, IADLs, leisure, and social participation.   CO-MORBIDITIES: may have co-morbidities  that affects occupational performance. Patient will benefit from skilled OT to address above impairments and improve overall function.  REHAB POTENTIAL: Good  PLAN:  OT FREQUENCY: 1-2x/week  (2x per week initially, progress to 1x per week PRN if pt makes good progress)  OT DURATION: 6 weeks (POC extended 2 weeks to allow for scheduling) + 7 additional visits requested on 10/20/23 (2x per week for 3 additional weeks). Pt agreed to updated OT POC.  PLANNED INTERVENTIONS: 97168 OT Re-evaluation, 97535 self care/ADL training, 25956 therapeutic exercise, 97530 therapeutic activity, 97112 neuromuscular re-education, 97140 manual therapy, 97035 ultrasound, 97018 paraffin, 38756 fluidotherapy, 97010 moist heat, 97010 cryotherapy, 97032 electrical stimulation (manual), 97760 Orthotics management and training, 43329 Splinting (initial encounter), [phone] Subsequent splinting/medication, manual lymph drainage, passive range of motion, functional mobility training, visual/perceptual remediation/compensation, energy conservation, patient/family education, and DME and/or AE instructions  RECOMMENDED OTHER SERVICES: PT eval re-scheduled  CONSULTED AND AGREED WITH PLAN OF CARE: Patient and family member/caregiver  PLAN FOR NEXT SESSION:   Continue to address functional tasks incorporating functional forward reach  Flex bar for UE strengthening  Higher level coordination tasks   [employer] - standing with gait belt  Review and update HEP PRN, body mechanics/ergonomics PRN   Wynetta Emery, OT 10/22/2023, 3:10 PM

## 2023-10-22 NOTE — Therapy (Signed)
OUTPATIENT PHYSICAL THERAPY NEURO TREATMENT - RECERTIFICATION   Patient Name: Priscilla Wilson MRN: [id number] DOB:February 14, 1951, 72 y.o., female Today's Date: 10/23/2023   PCP: Sigmund Hazel, MD REFERRING PROVIDER: Mariam Dollar, PA-C  END OF SESSION:  PT End of Session - 10/22/23 1448     Visit Number 13    Number of Visits 20   recert   Date for PT Re-Evaluation 11/20/23   recert   Authorization Type HTA    PT Start Time 1448   from OT session   PT Stop Time 1526    PT Time Calculation (min) 38 min    Equipment Utilized During Treatment Gait belt    Activity Tolerance Patient tolerated treatment well    Behavior During Therapy [facility] for tasks assessed/performed                 No past medical history on file. Past Surgical History:  Procedure Laterality Date   BREAST EXCISIONAL BIOPSY Left    benign   Patient Active Problem List   Diagnosis Date Noted   Essential hypertension 08/13/2023   Alteration of sensations, post-stroke 08/13/2023   Acute CVA (cerebrovascular accident) (HCC) 08/10/2023   TIA (transient ischemic attack) 08/03/2023   Stroke determined by clinical assessment (HCC) 08/03/2023    ONSET DATE: 08/14/23 referral  REFERRING DIAG: I63.9 (ICD-10-CM) - Cerebral infarction, unspecified   THERAPY DIAG:  Other lack of coordination  Muscle weakness (generalized)  Unsteadiness on feet  Rationale for Evaluation and Treatment: Rehabilitation  SUBJECTIVE:                                                                                                                                                                                             SUBJECTIVE STATEMENT:  Pt continues to use her SPC, no signiciant changes since lats visit. No pain today.  Pt accompanied by: significant other, [name]  PERTINENT HISTORY: none on file  PAIN:  Are you having pain? No  PRECAUTIONS: Fall   WEIGHT BEARING RESTRICTIONS: No  FALLS: Has patient fallen in last 6  months? No some near falls when walking in her house, using furniture to regain balance  LIVING ENVIRONMENT: Lives with: lives with their family Lives in: House/apartment Stairs: Yes: Internal: flight steps; on right going up and External: 4 steps; bilateral but cannot reach both Has following equipment at home: Walker - 2 wheeled, shower chair, and Grab bars  PLOF: Requires assistive device for independence  PATIENT GOALS: "my left leg needs work"  OBJECTIVE:  Note: Objective measures were completed at Evaluation unless otherwise noted.  DIAGNOSTIC FINDINGS: 08/04/23 Brain MRI IMPRESSION: 1.  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 Associated mild petechial blood products without frank hemorrhagic transformation or significant regional mass effect. 2. Underlying moderately advanced chronic microvascular ischemic disease.  COGNITION: Overall cognitive status: Within functional limits for tasks assessed   SENSATION: Intermittent areas of numbness in L hemibody  COORDINATION: L LE dysmetric and ataxic heel/shin and figure 8   POSTURE: No Significant postural limitations  LOWER EXTREMITY MMT:    MMT Right Eval Left Eval  Hip flexion 5 4  Hip extension    Hip abduction 5 4  Hip adduction 5 4  Hip internal rotation    Hip external rotation    Knee flexion 5 4  Knee extension 5 4  Ankle dorsiflexion 5 4  Ankle plantarflexion 5 4  Ankle inversion    Ankle eversion    (Blank rows = not tested)  BED MOBILITY:  Independent   TRANSFERS: Assistive device utilized: None  Sit to stand: SBA Stand to sit: SBA Chair to chair: SBA  STAIRS: Level of Assistance: SBA Stair Negotiation Technique: Step to Pattern with Single Rail on Right Number of Stairs: 4  Height of Stairs: 6   GAIT: Gait pattern: step through pattern, decreased step length- Left, decreased stance time- Left, decreased hip/knee flexion- Left, circumduction- Left, Left steppage, Left foot  flat, ataxic, wide BOS, and poor foot clearance- Left Distance walked: clinic Assistive device utilized: Walker - 2 wheeled and None Level of assistance: Modified independence and SBA  VITALS  Vitals:   10/22/23 1452  BP: 117/61  Pulse: 72       TODAY'S TREATMENT:   TherAct   Assessed vitals (see above) and WNL   For LTG assessment:  [facility] PT Assessment - 10/22/23 1457       Ambulation/Gait   Gait velocity 32.8 ft over 10.28 sec = 3.19 ft/sec      Standardized Balance Assessment   Standardized Balance Assessment Timed Up and Go Test      Timed Up and Go Test   TUG Normal TUG    Normal TUG (seconds) 9      Functional Gait  Assessment   Gait assessed  Yes    Gait Level Surface Walks 20 ft in less than 7 sec but greater than 5.5 sec, uses assistive device, slower speed, mild gait deviations, or deviates 6-10 in outside of the 12 in walkway width.    Change in Gait Speed Able to smoothly change walking speed without loss of balance or gait deviation. Deviate no more than 6 in outside of the 12 in walkway width.    Gait with Horizontal Head Turns Performs head turns smoothly with slight change in gait velocity (eg, minor disruption to smooth gait path), deviates 6-10 in outside 12 in walkway width, or uses an assistive device.    Gait with Vertical Head Turns Performs task with slight change in gait velocity (eg, minor disruption to smooth gait path), deviates 6 - 10 in outside 12 in walkway width or uses assistive device    Gait and Pivot Turn Pivot turns safely within 3 sec and stops quickly with no loss of balance.    Step Over Obstacle Is able to step over 2 stacked shoe boxes taped together (9 in total height) without changing gait speed. No evidence of imbalance.    Gait with Narrow Base of Support Is able to ambulate for 10 steps heel to toe with no staggering.    Gait with Eyes Closed Walks 20  ft, no assistive devices, good speed, no evidence of imbalance, normal gait  pattern, deviates no more than 6 in outside 12 in walkway width. Ambulates 20 ft in less than 7 sec.    Ambulating Backwards Walks 20 ft, uses assistive device, slower speed, mild gait deviations, deviates 6-10 in outside 12 in walkway width.    Steps Alternating feet, must use rail.    Total Score 25    FGA comment: 25/30            Obstacle course with no AD and min A to work on increasing step height/step length and SLS: Foam beam step overs with alt LE, cues to decrease circumduction Alt L/R cone taps with gait difficult due to ataxia +added in 5# ankle weights with improved control noted Removed 5# ankle weights with ongoing improved control noted  PATIENT EDUCATION:  Education details: Continue HEP Person educated: Patient and Spouse Education method: Explanation Education comprehension: verbalized understanding and needs further education  HOME EXERCISE PROGRAM:  Verbally added banded LAQ and seated march overs on 10/28 (pt declined handout)  Handout of quadruped clamshells and heel sits on 11/7  Handout of step ups on 11/25  GOALS: Goals reviewed with patient? Yes  SHORT TERM GOALS: Target date: 10/02/23  Pt will be independent with initial HEP for improved functional strength and balance.  Baseline: to be provided 10/01/23: verbalized compliance  Goal status: MET  2.  Pt will improve TUG to </= 15 secs to demonstrated reduced fall risk.  Baseline: 17.5s no AD 10/01/23: 12.75 sec no AD  Goal status: MET  3.  Pt will improve 5x STS to </= 10 sec to demo improved functional LE strength and balance.   Baseline: 11.6s no UE 10/01/23: 12.69 sec no UE  Goal status: NOT MET  4.  Pt will improve gait speed to >/= .73m/s m/s to demonstrate improved community ambulation  Baseline: .11m/s  10/01/23: 1.08 m/s no AD Goal status: MET LTG  5.  Pt will improve FGA to >/= 8/30 to demonstrate improved balance and reduced fall risk. Baseline: 3/30  10/01/23: 23/30  Goal  status: MET LTG   LONG TERM GOALS: Target date: 10/30/23  Pt will be independent with initial HEP for improved functional strength and balance  Baseline: to be provided Goal status: MET  Pt will improve TUG to </= 12 secs to demonstrated reduced fall risk Baseline: 17.5s no AD, 9 sec no AD (12/12) Goal status: MET  Pt will improve gait speed to >/= .6 m/s to demonstrate improved community ambulation Baseline: .44m/s, 0.9 m/s (12/12) Goal status: MET  Pt will improve FGA to >/= 13/30 to demonstrate improved balance and reduced fall risk Baseline: 3, 25/30 (12/12) Goal status: MET   NEW SHORT TERM GOALS=LONG TERM GOALS   NEW LONG TERM GOALS:  Target date: 11/20/2023  Pt will be independent with final HEP for improved strength, balance, transfers and gait. Baseline:  Goal status: INITIAL  2.   Pt will improve gait speed to >/= 1.2 m/s to demonstrate improved community ambulation Baseline: .10m/s, 0.9 m/s (12/12) Goal status: IN PROGRESS  3.   Pt will improve FGA to >/= 28/30 to demonstrate improved balance and reduced fall risk Baseline: 3, 25/30 (12/12) Goal status: IN PROGRESS    ASSESSMENT:  CLINICAL IMPRESSION:  Emphasis of skilled PT session on assessing LTG and creating new LTG for recertification of PT services. Pt has met 4/4 LTG due to being independent with her initial  HEP, decreasing her fall risk and improving her balance based on improved TUG score, gait speed, and FGA score. Pt continues to benefit from skilled therapy services to continue to work towards increasing her safety and independence with functional mobility and decreasing her reliance on anAD for balance. Continue POC.    OBJECTIVE IMPAIRMENTS: Abnormal gait, decreased balance, decreased coordination, decreased endurance, decreased knowledge of condition, decreased mobility, difficulty walking, decreased strength, impaired sensation, impaired tone, and impaired UE functional use.   ACTIVITY  LIMITATIONS: carrying, lifting, squatting, bed mobility, reach over head, hygiene/grooming, locomotion level, and caring for others  PARTICIPATION LIMITATIONS: meal prep, cleaning, interpersonal relationship, driving, shopping, and community activity  PERSONAL FACTORS: Age, Time since onset of injury/illness/exacerbation, and Transportation are also affecting patient's functional outcome.   REHAB POTENTIAL: Good  CLINICAL DECISION MAKING: Stable/uncomplicated  EVALUATION COMPLEXITY: Low  PLAN:  PT FREQUENCY: 2x/week  PT DURATION: 6 weeks + 3 weeks (recert)  PLANNED INTERVENTIONS: 11914- PT Re-evaluation, 97110-Therapeutic exercises, 97530- Therapeutic activity, [id number]- Neuromuscular re-education, 97535- Self Care, [id number]- Manual therapy, [phone]- Gait training, [phone]- Orthotic Fit/training, [phone]- Canalith repositioning, [phone]- Aquatic Therapy, Patient/Family education, Balance training, Stair training, Dry Needling, Vestibular training, Visual/preceptual remediation/compensation, and DME instructions  PLAN FOR NEXT SESSION: Monitor vitals. Add to HEP, L LE coordination, stairs, perturbations, gait training, quadruped, tall kneeling, line dancing, compliant surfaces, gentle grassy hills outdoors, curb practice w/ SPC, dual tasking ambulation w/ SPC, + UE coordination for ataxia, wants to work on getting away from [facility]   [employer], PT Peter Congo, PT, DPT, CSRS   10/23/2023, 9:48 AM

## 2023-10-27 ENCOUNTER — Ambulatory Visit: Payer: PPO | Admitting: Occupational Therapy

## 2023-10-27 ENCOUNTER — Ambulatory Visit: Payer: PPO | Admitting: Physical Therapy

## 2023-10-27 VITALS — BP 135/72 | HR 69

## 2023-10-27 DIAGNOSIS — R278 Other lack of coordination: Secondary | ICD-10-CM

## 2023-10-27 DIAGNOSIS — R29818 Other symptoms and signs involving the nervous system: Secondary | ICD-10-CM

## 2023-10-27 DIAGNOSIS — R29898 Other symptoms and signs involving the musculoskeletal system: Secondary | ICD-10-CM

## 2023-10-27 DIAGNOSIS — M6281 Muscle weakness (generalized): Secondary | ICD-10-CM

## 2023-10-27 DIAGNOSIS — R208 Other disturbances of skin sensation: Secondary | ICD-10-CM

## 2023-10-27 DIAGNOSIS — R2681 Unsteadiness on feet: Secondary | ICD-10-CM

## 2023-10-27 NOTE — Therapy (Signed)
OUTPATIENT OCCUPATIONAL THERAPY Treatment / Discharge  Patient Name: Priscilla Wilson MRN: [id number] DOB:[birth year], 72 y.o., female Today's Date: 10/27/2023  PCP: Sigmund Hazel, MD  REFERRING PROVIDER: Charlton Amor, PA-C   OCCUPATIONAL THERAPY DISCHARGE SUMMARY  Visits from Start of Care: 14  Current functional level related to goals / functional outcomes: Pt has met 100% of STG and 100% of LTG to satisfactory levels and is pleased with outcomes.   One LTG was deferred. Pt did not meet x2 revised goals d/t recent revision on 10/20/23 and limited time to demo progress d/t pt preference to D/C today.  Remaining deficits: Pt has no more significant functional deficits or pain.   Education / Equipment: Pt has all needed materials and education. Pt understands how to continue on with self-management. See tx notes for more details.    Patient goals were met. Patient is being discharged due to patient's request, pt meeting the stated rehab goals. and pt is pleased with current functional level.      END OF SESSION:  OT End of Session - 10/27/23 1504     Visit Number 14    Number of Visits 19   initial 12 visits + 7 additional visits   Date for OT Re-Evaluation 11/13/23    Authorization Type [employer] - follows Medicare guidelines    Progress Note Due on Visit 20    OT Start Time 1450    OT Stop Time 1515    OT Time Calculation (min) 25 min    Activity Tolerance Patient tolerated treatment well    Behavior During Therapy WFL for tasks assessed/performed                     No past medical history on file. Past Surgical History:  Procedure Laterality Date   BREAST EXCISIONAL BIOPSY Left    benign   Patient Active Problem List   Diagnosis Date Noted   Essential hypertension 08/13/2023   Alteration of sensations, post-stroke 08/13/2023   Acute CVA (cerebrovascular accident) (HCC) 08/10/2023   TIA (transient ischemic attack) 08/03/2023    Stroke determined by clinical assessment (HCC) 08/03/2023   ONSET DATE: 08/14/23 (referral date), 08/03/23 (date of CVA)  REFERRING DIAG: I63.9 (ICD-10-CM) - Cerebral infarction, unspecified   THERAPY DIAG:  Other lack of coordination  Other symptoms and signs involving the nervous system  Other symptoms and signs involving the musculoskeletal system  Muscle weakness (generalized)  Other disturbances of skin sensation  Rationale for Evaluation and Treatment: Rehabilitation  SUBJECTIVE:   SUBJECTIVE STATEMENT: Pt reported preference to D/C from OT today d/t pt is pleased with functional outcomes. Pt denied pain, recent falls, and changes to medication.  Pt reported no difficulty walking around without use of cane or other A/E. Pt reported improved ability to style hair with hairdryer reaching with L hand overhead without difficulty. Pt demo'd ability to maintain consistent matching rhythm with R and L hand tapping on tabletop bilaterally. Pt reported plugging in a plug to outlet recently without any evidence "shaking" of LUE with forward functional reach. Pt reported changing radio station in car with functional forward reach of L hand without "shaking."  Pt reported making several different kinds of cookies for baking tasks with family. Pt reported feeling confident with each step of baking task. Pt reported picking up tray with both hands without any "wobble." Pt reported completing tasks at oven with no concerns.  Pt accompanied by: self and significant other (husband: Priscilla Wilson)  PERTINENT HISTORY: Acute CVA, Essential HTN, alteration of sensation post-stroke, hyperthyroidism, asthma, hyperlipidemia, GERD, pre-diabetes  Per 08/21/23 D/C Summary: "Priscilla Wilson is a 72 y.o. female presented to the [facility] ED on 08/03/2023 with sudden onset of left-sided numbness and weakness... MRI brain reveals 1.6 cm acute ischemic right thalamic capsular infarct with associated mild petechial blood  products without frank hemorrhagic transformation or significant regional mass effect. 2D echo LVEF 70-75%. Hgb A1C 6.3%."   PRECAUTIONS: None Pt reported "they say I'm not allowed to drive" since stroke.  WEIGHT BEARING RESTRICTIONS: No  PAIN:  Are you having pain? Pt reported no pain today.   FALLS: Has patient fallen in last 6 months? No  LIVING ENVIRONMENT: Lives with: lives with their spouse Lives in: House/apartment Stairs: Yes: Internal: 16 steps; on left going up and External: 3 steps; bilateral but cannot reach both - Pt reported not needing to go upstairs during the day because all appliances and room accessible at home. Has following equipment at home: Dan Humphreys - 2 wheeled, shower chair, and Grab bars  PLOF: Independent, pt driving, pt retired  PATIENT GOALS: Pt reported "I don't have all my feeling back in L arm, but it's come a long way." "I want to be able to do all the things I did before." Pt reports wanting to get back to line dancing and driving, to "get rid of walker," and to "make Christmas cookies.  OBJECTIVE:  Note: Objective measures were completed at Evaluation unless otherwise noted.  HAND DOMINANCE: Right  ADLs: Overall ADLs: ind, sometimes with supervision for safety Transfers/ambulation related to ADLs: ind with supervision Eating: ind Grooming: ind UB Dressing: ind, no difficulty with hooking bra, manipulating buttons, zippers LB Dressing: ind Toileting: ind with grab bar next to toilet Bathing: ind Tub Shower transfers: no difficulty; with supervision  Equipment: Walk in shower and walk-in tub  IADLs: Shopping: dependent Light housekeeping: have not attempted some tasks (e.g. laundry, dishwashing), ind wiping table and pt reports she is "pretty sure I can do [employer], putting dishes in dishwasher]". Meal Prep: currently dependent because husband wants to complete tasks. Pt reports she may be able to complete cutting tasks. Pt reports  difficulty carrying heavy objects, such as pan or tray.  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 Pt's spouse reports pt has some unsteadiness "from time-to-time" though reports it has improved since stroke. Pt reports some difficulty with balance when moving during some tasks (e.g. attempting to stand on one foot for LB dressing while leaning against bed). Pt generally uses the walker during the day.  POSTURE COMMENTS:  Ind, no difficulty , supports self   ACTIVITY TOLERANCE: Activity tolerance: Pt reported increased fatigue during the day, such as from re-learning tasks since the stroke, walking far distances, and completing hand exercises.  FUNCTIONAL OUTCOME MEASURES: FOTO: 55    UPPER EXTREMITY ROM:    Active ROM Right eval Left eval  Shoulder flexion [facility]  Shoulder abduction    Shoulder adduction    Shoulder extension    Shoulder internal rotation    Shoulder external rotation    Elbow flexion    Elbow extension    Wrist flexion    Wrist extension    Wrist ulnar deviation    Wrist radial deviation    Wrist pronation    Wrist supination    (Blank rows = not tested)  Note: OT noted slightly slower movements of LUE compared to RUE.  UPPER EXTREMITY MMT:     MMT Right eval Left eval  Shoulder flexion 4 4-  Shoulder abduction 4 4-  Shoulder adduction    Shoulder extension    Shoulder internal rotation    Shoulder external rotation    Middle trapezius    Lower trapezius    Elbow flexion    Elbow extension    Wrist flexion    Wrist extension    Wrist ulnar deviation    Wrist radial deviation    Wrist pronation    Wrist supination    (Blank rows = not tested)  HAND FUNCTION: Grip strength: Right: 34.9 lbs; Left: 43.8 lbs - average of 3 trials  OT noted increased shaking of LUE compared to RUE  when pt grasped dynamometer with LUE.  COORDINATION: 9 Hole Peg test: Right: 18 sec; Left: 64 sec  Several drops with LUE, shaking of LUE.  SENSATION: Pt reports tips of fingers have difficulty with grading of force: e.g. "I can't tell how tight I'm holding something."   Pt reports impaired sensation of LUE "Like there is a veil over my left arm." Pt reports numbness of L axilla. Pt reports "tightness" of L bicep.  EDEMA: None noted  MUSCLE TONE: none noted  COGNITION: Overall cognitive status: Within functional limits for tasks assessed  VISION: Subjective report: Pt reports no changes in vision. Pt correctly read clock on wall. Baseline vision: Wears glasses all the time, Wears glasses for distance and reading. Visual history: cataracts - pt reports doctor says cataracts are "starting" though no treatment yet.  VISION ASSESSMENT: Not tested Appeared [facility] when completing other assessment tasks   Patient has difficulty with following activities due to following visual impairments: none noted.  PERCEPTION: Not tested  PRAXIS: Not tested  OBSERVATIONS: Pt accompanied by husband. Pt appeared well-kept. Pt ambulated with 2-wheeled rolling walker with slow altered gait.   TODAY'S TREATMENT:                                                                                                                              DATE:   [facility] OT assessed pt's progress towards goals, see below for updates.   OT educated pt on OT D/C and progress towards goals. Pt agreeable to D/C. Pt demo'd and reported great progress towards goals and improved functional use of affected UE.   PATIENT EDUCATION: Education details: see today's treatment above Person educated: Patient and Spouse Education method: Explanation Education comprehension: verbalized understanding  HOME EXERCISE PROGRAM: 09/03/23 - Shoulder and theraputty. Access Code: [email or web address] 09/07/23 - Coordination HEP (handout, see pt  instructions) 09/10/23 - Sensory precautions and sensory activities (handout, see pt instructions) 09/17/23 - Yellow Theraband. Access Code: [employer] 10/01/23 - Updated Shoulder HEP - Access Code: [email or web address] 10/20/23 - Updated to red Theraband - Access Code: [email or web address] (same as theraband above)   GOALS: Goals reviewed with  patient? Yes  SHORT TERM GOALS: Target date: 09/25/23  Patient will demonstrate improved FM coordination by completing nine-hole peg with use of LUE in 53 seconds or less with no more than 3 drops. Baseline: Left: 64 sec, Several drops with LUE 09/22/23 - Left: 43 seconds, 1 drop Goal status: 09/22/23 - MET  2.  Pt will demonstrate independence with initial HEP for coordination and theraputty of LUE. Baseline: New to outpt OT 09/22/23 - Pt demo'd ind with HEP Goal status: 09/22/23 - MET  3. Patient will demonstrate at least 5 lbs increase of LUE grip strength (average 3 trials) and stable LUE grasp as needed to open jars and other containers.  Baseline: Left: 43.8 lbs, shaking of LUE when grasping dynamometer 09/22/23 - 50.7 lbs average, relatively stable grasp with LUE (mild shaking)  Goal status: 09/22/23 - MET  4. Pt will report little difficulty picking a coin up from the table using LUE.  Baseline: Per FOTO, pt picks up coins from table "with much difficulty" 09/22/23 - Pt easily picked up x5 pennies and placed in slot with LUE. Pt reported no diffculty with picking up coins. Goal status: 09/22/23 - MET   LONG TERM GOALS: Target date: 11/13/23 (dates extended d/t additional OT visits requested on 10/20/23)  Patient will demonstrate improved FM coordination by completing nine-hole peg with use of LUE in 40 seconds or less with no more than 1 drop. Baseline: Left: 64 sec, Several drops with LUE 10/13/23 - Left: 36 seconds, 0 drops Goal status: 10/13/23 - MET and then revised (see below)   1 REVISED. Patient will demonstrate improved FM coordination by completing  nine-hole peg with use of LUE in 32 seconds or less with no drops. Baseline: Left: 64 sec, Several drops with LUE 10/13/23 - Left: 36 seconds, 0 drops 10/20/23 - Trial 1 - Left: 31, 1 drop, Trial 2 - Left: 28 seconds, 0 drops Goal status: 10/13/23 - goal revised, 10/20/23 - MET  2.  Pt will report no more than mild difficulty with carrying an item in her left, affected hand to improve participation in light household tasks (e.g. dishwashing, laundry). Baseline: pt reports difficulty carrying heavy items 10/13/23 - Pt reported no difficulty with carrying clothing items in L affected hand by carrying clothes close to body based on body mechanics/ergonomics principles. Pt reported continuing to avoid using left hand for heavy plates and dishware d/t safety concerns though picking up silverware with LUE without difficulty. 10/20/23  - Pt reported no issue carrying light items in L hand.  Pt reported continuing dropping objects with L hand though not as much as before. Pt reported not dropping items when completing laundry or dishwashing tasks. Goal status: 10/20/23 - MET  3.  Pt will demonstrate independence with updated HEP PRN. Baseline: New to outpt OT 10/13/23 - Pt reported completing current HEP ind, including shoulder AROM, theraputty, and coordination. Pt demo'd understanding of many exercises ind. 10/20/23 - Pt confirmed no questions or concerns about current HEP. HEP will continue to update PRN as pt participates in additional OT sessions. Goal status: 10/27/23 - MET 10/27/23 - Pt confirmed no questions or concerns.  4.  Pt will participate in cooking/baking task safely at mod I level. Baseline: pt has not attempted cooking/baking tasks since stroke 10/13/23 - Pt reported cooking/baking tasks going well. Pt reported increased difficulty carrying tray later in the day though can adapt with adaptive strategies to complete lifting task (e.g. placing left hand under tray to  help stabilize tray). Pt  reported completing cooking tasks with supervision from spouse, especially when taking items in/out of oven. 10/20/23 - Pt reported cooking/baking "going very well." Pt reported being extra cautious d/t decreased sensation of affected UE. However, pt reported loading dishwasher and moving items in kitchen without much difficulty. Pt reported increased difficulty in morning because affected UE feels weaker in morning. Pt is still concerned about taking heavier items out of oven d/t safety concerns. 10/27/23 - Pt reported making seven different kinds of cookies for baking tasks. Pt reported feeling confident with each step of baking task. Pt reported picking up tray with both hands without "wobble." Pt reported completing tasks at oven with no concerns. Goal status: 10/27/23 - MET  5.  Pt will complete D/C FOTO Baseline: Intake FOTO completed at eval Goal status: FOTO not captured at D/C therefore deferred  6. Updated/revised 10/20/23 - Patient will demonstrate at least 50 lbs of LUE grip strength (average 3 trials) and stable LUE grasp (without "shaking") as needed to open jars and other containers.  Baseline: Left: 43.8 lbs, shaking of LUE when grasping dynamometer 09/22/23 - 50.7 lbs average, relatively stable grasp with LUE (mild shaking) 10/20/23 - 45.6 lbs average of 3 trials with stable grasp of LUE, Pt c/o arthritis pain today with possibly increased symptoms d/t rainy weather today. 10/27/23 - 45.5 lbs average 3 lbs  Goal status: 10/27/23 - MET, did not meet revised goal d/t recent revision on 10/20/23 and pt preference to D/C today  7. Updated/revised 10/20/23 - Patient will demonstrate improved FM coordination by completing nine-hole peg with use of LUE in 24 seconds or less with no drops. Baseline: Left: 64 sec, Several drops with LUE 10/13/23 - Left: 36 seconds, 0 drops 10/20/23 - Trial 1 - Left: 31, 1 drop, Trial 2 - Left: 28 seconds, 0 drops 10/27/23 - 32 seconds, no drops, minimal to  no shaking of LUE Goal status: 10/27/23 - MET, did not meet revised goal d/t recent revision on 10/20/23 and pt preference to D/C today  8. Added 10/20/23 - Pt will demo improved coordination of LUE as evidenced by pt reporting no more than mild ataxic ("shaking") movements of affected UE when grasping items with elbow extended for functional forward reach throughout the whole day. Baseline: Pt reported increased ataxic movements of LUE in morning (moderate ataxic movements in morning) which improve during the course of the day (mild ataxic movements in the afternoon).  10/27/23 - Pt reported minimal instances of ataxic/shaking movements of LUE during daily tasks.  Goal status: 10/27/23 - MET  ASSESSMENT:  CLINICAL IMPRESSION: Pt has met 100% of STG and 100% of LTG to satisfactory levels and is pleased with outcomes. One LTG was deferred. Pt did not meet x2 revised goals d/t recent revision on 10/20/23 and limited time to demo progress d/t pt preference to D/C today. Pt has no more significant functional deficits or pain. Pt reported and demo'd improved functional use of affected UE for functional tasks. Pt demo'd minimal to no ataxic movements for forward functional reach with affected UE.  Pt made excellent progress towards goals. Patient goals were met. Patient is being discharged due to patient's request, pt meeting the stated rehab goals. and pt is pleased with current functional level. Pt agreeable to D/C. Pt confirmed no additional questions or concerns at this time.  PERFORMANCE DEFICITS: in functional skills including ADLs, IADLs, coordination, dexterity, proprioception, sensation, ROM, strength, Fine motor control, Gross motor control, balance,  body mechanics, endurance, and UE functional use, cognitive skills including energy/drive, and psychosocial skills including environmental adaptation.   IMPAIRMENTS: are limiting patient from ADLs, IADLs, leisure, and social participation.    CO-MORBIDITIES: may have co-morbidities  that affects occupational performance. Patient will benefit from skilled OT to address above impairments and improve overall function.  REHAB POTENTIAL: Good  PLAN:  OT FREQUENCY: 1-2x/week  (2x per week initially, progress to 1x per week PRN if pt makes good progress)  OT DURATION: 6 weeks (POC extended 2 weeks to allow for scheduling) + 7 additional visits requested on 10/20/23 (2x per week for 3 additional weeks). Pt agreed to updated OT POC.  PLANNED INTERVENTIONS: 97168 OT Re-evaluation, 97535 self care/ADL training, 95284 therapeutic exercise, 97530 therapeutic activity, 97112 neuromuscular re-education, 97140 manual therapy, 97035 ultrasound, 97018 paraffin, 13244 fluidotherapy, 97010 moist heat, 97010 cryotherapy, 97032 electrical stimulation (manual), 97760 Orthotics management and training, 01027 Splinting (initial encounter), [id number] Subsequent splinting/medication, manual lymph drainage, passive range of motion, functional mobility training, visual/perceptual remediation/compensation, energy conservation, patient/family education, and DME and/or AE instructions  RECOMMENDED OTHER SERVICES: PT eval re-scheduled  CONSULTED AND AGREED WITH PLAN OF CARE: Patient and family member/caregiver  PLAN FOR NEXT SESSION:  N/A - Pt D/C from OT today   Wynetta Emery, OT 10/27/2023, 3:20 PM

## 2023-10-27 NOTE — Therapy (Signed)
OUTPATIENT PHYSICAL THERAPY NEURO TREATMENT - DISCHARGE SUMMARY   Patient Name: Priscilla Wilson MRN: [id number] DOB:09/11/1951, 72 y.o., female Today's Date: 10/27/2023   PCP: Sigmund Hazel, MD REFERRING PROVIDER: Mariam Dollar, PA-C  PHYSICAL THERAPY DISCHARGE SUMMARY  Visits from Start of Care: 14  Current functional level related to goals / functional outcomes: Mod I w/all ADLs w/intermittent use of SPC    Remaining deficits: Mild ataxia of LLE, low fall risk    Education / Equipment: HEP   Patient agrees to discharge. Patient goals were met. Patient is being discharged due to being pleased with the current functional level.   END OF SESSION:  PT End of Session - 10/27/23 1403     Visit Number 14    Number of Visits 20   recert   Date for PT Re-Evaluation 11/20/23   recert   Authorization Type HTA    PT Start Time 1402    PT Stop Time 1430   DC   PT Time Calculation (min) 28 min    Equipment Utilized During Treatment --    Activity Tolerance Patient tolerated treatment well    Behavior During Therapy WFL for tasks assessed/performed                  No past medical history on file. Past Surgical History:  Procedure Laterality Date   BREAST EXCISIONAL BIOPSY Left    benign   Patient Active Problem List   Diagnosis Date Noted   Essential hypertension 08/13/2023   Alteration of sensations, post-stroke 08/13/2023   Acute CVA (cerebrovascular accident) (HCC) 08/10/2023   TIA (transient ischemic attack) 08/03/2023   Stroke determined by clinical assessment (HCC) 08/03/2023    ONSET DATE: 08/14/23 referral  REFERRING DIAG: I63.9 (ICD-10-CM) - Cerebral infarction, unspecified   THERAPY DIAG:  Other lack of coordination  Muscle weakness (generalized)  Unsteadiness on feet  Rationale for Evaluation and Treatment: Rehabilitation  SUBJECTIVE:                                                                                                                                                                                              SUBJECTIVE STATEMENT:  Pt reports she is not using her cane at home. Is ready for therapist to tell her she "can get rid of it". No falls or acute changes. Feels as though she is ready to be finished with therapy   Pt accompanied by: significant other, [name]  PERTINENT HISTORY: none on file  PAIN:  Are you having pain? No  PRECAUTIONS: Fall   WEIGHT BEARING RESTRICTIONS: No  FALLS: Has patient fallen in  last 6 months? No some near falls when walking in her house, using furniture to regain balance  LIVING ENVIRONMENT: Lives with: lives with their family Lives in: House/apartment Stairs: Yes: Internal: flight steps; on right going up and External: 4 steps; bilateral but cannot reach both Has following equipment at home: Walker - 2 wheeled, shower chair, and Grab bars  PLOF: Requires assistive device for independence  PATIENT GOALS: "my left leg needs work"  OBJECTIVE:  Note: Objective measures were completed at Evaluation unless otherwise noted.  DIAGNOSTIC FINDINGS: 08/04/23 Brain MRI IMPRESSION: 1. 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 Associated mild petechial blood products without frank hemorrhagic transformation or significant regional mass effect. 2. Underlying moderately advanced chronic microvascular ischemic disease.  COGNITION: Overall cognitive status: Within functional limits for tasks assessed   SENSATION: Intermittent areas of numbness in L hemibody  COORDINATION: L LE dysmetric and ataxic heel/shin and figure 8   POSTURE: No Significant postural limitations  LOWER EXTREMITY MMT:    MMT Right Eval Left Eval  Hip flexion 5 4  Hip extension    Hip abduction 5 4  Hip adduction 5 4  Hip internal rotation    Hip external rotation    Knee flexion 5 4  Knee extension 5 4  Ankle dorsiflexion 5 4  Ankle plantarflexion 5 4  Ankle inversion    Ankle  eversion    (Blank rows = not tested)  BED MOBILITY:  Independent   TRANSFERS: Assistive device utilized: None  Sit to stand: SBA Stand to sit: SBA Chair to chair: SBA  STAIRS: Level of Assistance: SBA Stair Negotiation Technique: Step to Pattern with Single Rail on Right Number of Stairs: 4  Height of Stairs: 6   GAIT: Gait pattern: step through pattern, decreased step length- Left, decreased stance time- Left, decreased hip/knee flexion- Left, circumduction- Left, Left steppage, Left foot flat, ataxic, wide BOS, and poor foot clearance- Left Distance walked: clinic Assistive device utilized: Walker - 2 wheeled and None Level of assistance: Modified independence and SBA  VITALS  Vitals:   10/27/23 1406  BP: 135/72  Pulse: 69      TODAY'S TREATMENT:   TherAct   Assessed vitals (see above) and WNL for therapy  Discussed pt's goals and readiness to DC from PT and OT today. Pt reports she made several hundred cookies with her granddaughter this weekend and was able to clean up all her dishes and bake with no cane and no issues. Reports she only uses the cane when she needs to, mostly when her arthritis is flared up or in the community. Pt and husband in agreement that pt is doing well and is ready to DC today.    PATIENT EDUCATION:  Education details: Continue HEP, how to obtain new PT referral if mobility needs change  Person educated: Patient and Spouse Education method: Explanation Education comprehension: verbalized understanding  HOME EXERCISE PROGRAM:  Verbally added banded LAQ and seated march overs on 10/28 (pt declined handout)  Handout of quadruped clamshells and heel sits on 11/7  Handout of step ups on 11/25  GOALS: Goals reviewed with patient? Yes  SHORT TERM GOALS: Target date: 10/02/23  Pt will be independent with initial HEP for improved functional strength and balance.  Baseline: to be provided 10/01/23: verbalized compliance  Goal status:  MET  2.  Pt will improve TUG to </= 15 secs to demonstrated reduced fall risk.  Baseline: 17.5s no AD 10/01/23: 12.75 sec no AD  Goal status: MET  3.  Pt will improve 5x STS to </= 10 sec to demo improved functional LE strength and balance.   Baseline: 11.6s no UE 10/01/23: 12.69 sec no UE  Goal status: NOT MET  4.  Pt will improve gait speed to >/= .72m/s m/s to demonstrate improved community ambulation  Baseline: .73m/s  10/01/23: 1.08 m/s no AD Goal status: MET LTG  5.  Pt will improve FGA to >/= 8/30 to demonstrate improved balance and reduced fall risk. Baseline: 3/30  10/01/23: 23/30  Goal status: MET LTG   LONG TERM GOALS: Target date: 10/30/23  Pt will be independent with initial HEP for improved functional strength and balance  Baseline: to be provided Goal status: MET  Pt will improve TUG to </= 12 secs to demonstrated reduced fall risk Baseline: 17.5s no AD, 9 sec no AD (12/12) Goal status: MET  Pt will improve gait speed to >/= .6 m/s to demonstrate improved community ambulation Baseline: .52m/s, 0.9 m/s (12/12) Goal status: MET  Pt will improve FGA to >/= 13/30 to demonstrate improved balance and reduced fall risk Baseline: 3, 25/30 (12/12) Goal status: MET   NEW SHORT TERM GOALS=LONG TERM GOALS   NEW LONG TERM GOALS:  Target date: 11/20/2023  Pt will be independent with final HEP for improved strength, balance, transfers and gait. Baseline:  Goal status: MET  2.   Pt will improve gait speed to >/= 1.2 m/s to demonstrate improved community ambulation Baseline: .22m/s, 0.9 m/s (12/12) Goal status: DC  3.   Pt will improve FGA to >/= 28/30 to demonstrate improved balance and reduced fall risk Baseline: 3, 25/30 (12/12) Goal status: DC    ASSESSMENT:  CLINICAL IMPRESSION:  Emphasis of skilled PT session on DC from PT. Pt's LTGs were assessed last session and pt had met all goals, placing her at a low fall risk and close to baseline level of  function. Pt reports she is not using the cane at home unless she needs to due to pain or if she is going out. Pt verbalized readiness to DC from PT multiple times during session and reports she has no difficulty w/ADLs, so will DC per pt request and due to no safety concerns. Pt continues to have mild ataxia of LLE, especially w/fatigue, but is not limiting ADLs at this time.    OBJECTIVE IMPAIRMENTS: Abnormal gait, decreased balance, decreased coordination, decreased endurance, decreased knowledge of condition, decreased mobility, difficulty walking, decreased strength, impaired sensation, impaired tone, and impaired UE functional use.   ACTIVITY LIMITATIONS: carrying, lifting, squatting, bed mobility, reach over head, hygiene/grooming, locomotion level, and caring for others  PARTICIPATION LIMITATIONS: meal prep, cleaning, interpersonal relationship, driving, shopping, and community activity  PERSONAL FACTORS: Age, Time since onset of injury/illness/exacerbation, and Transportation are also affecting patient's functional outcome.   REHAB POTENTIAL: Good  CLINICAL DECISION MAKING: Stable/uncomplicated  EVALUATION COMPLEXITY: Low  PLAN:  PT FREQUENCY: 2x/week  PT DURATION: 6 weeks + 3 weeks (recert)  PLANNED INTERVENTIONS: 40981- PT Re-evaluation, 97110-Therapeutic exercises, 97530- Therapeutic activity, 97112- Neuromuscular re-education, 97535- Self Care, [id number]- Manual therapy, [phone]- Gait training, [phone]- Orthotic Fit/training, [phone]- Canalith repositioning, [id number]- Aquatic Therapy, Patient/Family education, Balance training, Stair training, Dry Needling, Vestibular training, Visual/preceptual remediation/compensation, and DME instructions   Jill Alexanders Caretha Rumbaugh, PT, DPT  10/27/2023, 2:35 PM

## 2023-10-28 ENCOUNTER — Other Ambulatory Visit: Payer: Self-pay

## 2023-10-28 DIAGNOSIS — E049 Nontoxic goiter, unspecified: Secondary | ICD-10-CM | POA: Diagnosis not present

## 2023-10-28 DIAGNOSIS — Z8673 Personal history of transient ischemic attack (TIA), and cerebral infarction without residual deficits: Secondary | ICD-10-CM | POA: Diagnosis not present

## 2023-10-28 DIAGNOSIS — E0590 Thyrotoxicosis, unspecified without thyrotoxic crisis or storm: Secondary | ICD-10-CM | POA: Diagnosis not present

## 2023-10-28 DIAGNOSIS — I10 Essential (primary) hypertension: Secondary | ICD-10-CM | POA: Diagnosis not present

## 2023-10-28 DIAGNOSIS — E7800 Pure hypercholesterolemia, unspecified: Secondary | ICD-10-CM | POA: Diagnosis not present

## 2023-10-28 DIAGNOSIS — R7303 Prediabetes: Secondary | ICD-10-CM | POA: Diagnosis not present

## 2023-10-28 NOTE — Patient Outreach (Signed)
 Telephone outreach to patient to obtain mRS was successfully completed. MRS= 1  [facility] Care Management Assistant [phone]

## 2023-10-29 ENCOUNTER — Ambulatory Visit: Payer: PPO | Admitting: Physical Therapy

## 2023-10-29 ENCOUNTER — Ambulatory Visit: Payer: PPO | Admitting: Occupational Therapy

## 2023-11-02 ENCOUNTER — Ambulatory Visit: Payer: PPO

## 2023-11-02 ENCOUNTER — Encounter: Payer: PPO | Admitting: Occupational Therapy

## 2023-11-05 ENCOUNTER — Ambulatory Visit: Payer: PPO

## 2023-11-06 ENCOUNTER — Encounter: Payer: PPO | Admitting: Occupational Therapy

## 2023-11-06 ENCOUNTER — Ambulatory Visit: Payer: PPO

## 2023-11-10 ENCOUNTER — Ambulatory Visit: Payer: Self-pay | Admitting: Physical Therapy

## 2023-11-10 ENCOUNTER — Encounter: Payer: PPO | Admitting: Occupational Therapy

## 2023-11-10 DIAGNOSIS — Z1231 Encounter for screening mammogram for malignant neoplasm of breast: Secondary | ICD-10-CM | POA: Diagnosis not present

## 2023-11-12 ENCOUNTER — Ambulatory Visit: Payer: Self-pay

## 2023-11-12 ENCOUNTER — Encounter: Payer: Self-pay | Admitting: Physical Medicine & Rehabilitation

## 2023-11-12 ENCOUNTER — Encounter: Payer: PPO | Attending: Registered Nurse | Admitting: Physical Medicine & Rehabilitation

## 2023-11-12 ENCOUNTER — Encounter: Payer: PPO | Admitting: Occupational Therapy

## 2023-11-12 VITALS — BP 114/73 | HR 67 | Ht 60.0 in | Wt 136.2 lb

## 2023-11-12 DIAGNOSIS — I69398 Other sequelae of cerebral infarction: Secondary | ICD-10-CM | POA: Insufficient documentation

## 2023-11-12 DIAGNOSIS — I639 Cerebral infarction, unspecified: Secondary | ICD-10-CM | POA: Insufficient documentation

## 2023-11-12 DIAGNOSIS — R209 Unspecified disturbances of skin sensation: Secondary | ICD-10-CM | POA: Insufficient documentation

## 2023-11-12 DIAGNOSIS — R269 Unspecified abnormalities of gait and mobility: Secondary | ICD-10-CM | POA: Insufficient documentation

## 2023-11-12 DIAGNOSIS — I69393 Ataxia following cerebral infarction: Secondary | ICD-10-CM | POA: Diagnosis not present

## 2023-11-12 NOTE — Progress Notes (Signed)
 Subjective:  Pt reported preference to D/C from OT today d/t pt is pleased with functional outcomes. Pt denied pain, recent falls, and changes to medication.   Pt reported no difficulty walking around without use of cane or other A/E. Pt reported improved ability to style hair with hairdryer reaching with L hand overhead without difficulty. Pt demo'd ability to maintain consistent matching rhythm with R and L hand tapping on tabletop bilaterally. Pt reported plugging in a plug to outlet recently without any evidence shaking of LUE with forward functional reach. Pt reported changing radio station in car with functional forward reach of L hand without shaking.  Patient ID: Priscilla Wilson, female    DOB: 1951-06-24, 73 y.o.   MRN: [id number] Admit date: 08/10/2023 Discharge date: 08/21/2023 73 y.o. female presented to the [facility] ED on 08/03/2023 with sudden onset of left-sided numbness and weakness. She was emergently evaluated by teleneurology and she was given TNKase . PMH significant for hyperthyroidism on methimazole , asthma, hyperlipidemia, GERD. Methimazole  initiated with improvement in heart rate, anxiety. Metoprolol  ordered with improvement in blood pressure, Cleviprex  off. 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 2D echo LVEF 70-75%. Hgb A1C 6.3%. Now on aspirin  81 mg daily and clopidogrel  75 mg daily for 3 weeks then aspirin  alone. Recommend 30 day cardiac event monitoring. Patient continues to need up to mod A to navigate rolling walker when turning and max cues for placement of LLE. Increased cues needed for awareness of LUE with bed mobility and functional mobility. 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  Patient has followed up with neurology.  Get scheduled for right follow-up with neurology.  Patient has followed up with cardiology on 09/25/2023.  Continued on beta-blocker for hypertension as well as statin for HLD patient currently on asked alone for stroke prophylaxis.  She is finished up with her PT and OT outpatient programs.  She no longer uses an assistive device for ambulation. Discharge from outpatient PT OT on 10/27/2023.  Modified I level No falls  No vision issues Left finger tip numbness improving  Occ foot drag   Pain Inventory Average Pain 3 Pain Right Now 1 My pain is aching  In the last 24 hours, has pain interfered with the following? General activity 1 Relation with others 0 Enjoyment of life 0 What TIME of day is your pain at its worst? morning  Sleep (in general) Good  Pain is worse with: walking Pain improves with: rest Relief from Meds: 8  Family History  Problem Relation Age of Onset   Cancer - Ovarian Sister    Social History   Socioeconomic History   Marital status: Married    Spouse name: Not on file   Number of children: Not on file   Years of education: Not on file   Highest education level: Not on file  Occupational History   Not on file  Tobacco Use   Smoking status: Never    Passive exposure: Past   Smokeless tobacco: Never  Vaping Use   Vaping status: Never Used  Substance and Sexual Activity   Alcohol use: Not Currently   Drug use: Never   Sexual activity: Yes  Other Topics Concern   Not on file  Social History Narrative   Not  on file   Social Drivers of Health   Financial Resource Strain: Not on file  Food Insecurity: No Food Insecurity (08/05/2023)   Hunger Vital Sign    Worried About Running Out of Food in the Last Year: Never true    Ran Out of Food in the Last Year: Never true  Transportation Needs: No Transportation Needs (08/05/2023)   PRAPARE - Administrator, Civil Service  (Medical): No    Lack of Transportation (Non-Medical): No  Physical Activity: Not on file  Stress: Not on file  Social Connections: Not on file   Past Surgical History:  Procedure Laterality Date   BREAST EXCISIONAL BIOPSY Left    benign   Past Surgical History:  Procedure Laterality Date   BREAST EXCISIONAL BIOPSY Left    benign   No past medical history on file. BP 114/73   Pulse 67   Ht 5' (1.524 m)   Wt 136 lb 3.2 oz (61.8 kg)   BMI 26.60 kg/m   Opioid Risk Score:   Fall Risk Score:  `1  Depression screen PHQ 2/9     11/12/2023    1:40 PM 10/01/2023    1:38 PM 09/02/2023    8:45 AM  Depression screen PHQ 2/9  Decreased Interest 0 0 0  Down, Depressed, Hopeless 0 0 0  PHQ - 2 Score 0 0 0  Altered sleeping   0  Tired, decreased energy   0  Change in appetite   0  Feeling bad or failure about yourself    0  Trouble concentrating   0  Moving slowly or fidgety/restless   0  Suicidal thoughts   0  PHQ-9 Score   0     Review of Systems  All other systems reviewed and are negative.      Objective:   Physical Exam  General no acute distress Mood and affect appropriate Motor strength is 5/5 in the right deltoid, bicep, tricep, grip, hip flexor, knee extensor, ankle dorsiflexor and plantar flex Left side 4/5 in the deltoid bicep tricep grip hip flexor knee extensor ankle 0 point plantar flexor Negative straight leg raising test bilaterally Sensation intact to light touch bilateral upper and lower limbs .  Nose finger intact on the right side Finger-nose-finger with mild dysmetria on the left side Ambulates without assist device no evidence of toe drag and instability there is some widened base of support with some difficulty with foot placement in the left lower extremity. Finger to thumb opposition intact bilaterally Negative dysdiadochokinesis with rapid alternating supination pronation bilateral upper extremity Visual fields are intact confrontation  testing      Assessment & Plan:  1.  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  The patient has regained modified independent level.  I do think she can go back to driving but she do so in a graduated fashion with her husband in the car and empty parking lot first practicing backing up and parking then progressing to the likely traveled side straight before going into heavier traffic.  Once she is doing better she can go out on her own.  I would expect her main issue would be to return signals with the left upper extremity although I do think with stabilization of the steering wheel she should be able to do this without difficulty. I will see her back in 6 months Follow-up with PCP

## 2023-11-12 NOTE — Patient Instructions (Signed)
 Graduated return to driving instructions were provided. It is recommended that the patient first drives with another licensed driver in an empty parking lot. If the patient does well with this, and they can drive on a quiet street with the licensed driver. If the patient does well with this they can drive on a busy street with a licensed driver. If the patient does well with this, the next time out they can go by himself. For the first month after resuming driving, I recommend no nighttime or Interstate driving.

## 2023-12-23 DIAGNOSIS — M0579 Rheumatoid arthritis with rheumatoid factor of multiple sites without organ or systems involvement: Secondary | ICD-10-CM | POA: Diagnosis not present

## 2023-12-23 DIAGNOSIS — Z79899 Other long term (current) drug therapy: Secondary | ICD-10-CM | POA: Diagnosis not present

## 2024-01-19 DIAGNOSIS — Z1589 Genetic susceptibility to other disease: Secondary | ICD-10-CM | POA: Diagnosis not present

## 2024-01-19 DIAGNOSIS — E663 Overweight: Secondary | ICD-10-CM | POA: Diagnosis not present

## 2024-01-19 DIAGNOSIS — F325 Major depressive disorder, single episode, in full remission: Secondary | ICD-10-CM | POA: Diagnosis not present

## 2024-01-19 DIAGNOSIS — M0579 Rheumatoid arthritis with rheumatoid factor of multiple sites without organ or systems involvement: Secondary | ICD-10-CM | POA: Diagnosis not present

## 2024-01-19 DIAGNOSIS — E041 Nontoxic single thyroid nodule: Secondary | ICD-10-CM | POA: Diagnosis not present

## 2024-01-19 DIAGNOSIS — M1991 Primary osteoarthritis, unspecified site: Secondary | ICD-10-CM | POA: Diagnosis not present

## 2024-01-19 DIAGNOSIS — Z79899 Other long term (current) drug therapy: Secondary | ICD-10-CM | POA: Diagnosis not present

## 2024-01-19 DIAGNOSIS — Z8673 Personal history of transient ischemic attack (TIA), and cerebral infarction without residual deficits: Secondary | ICD-10-CM | POA: Diagnosis not present

## 2024-01-19 DIAGNOSIS — R7303 Prediabetes: Secondary | ICD-10-CM | POA: Diagnosis not present

## 2024-01-19 DIAGNOSIS — E0590 Thyrotoxicosis, unspecified without thyrotoxic crisis or storm: Secondary | ICD-10-CM | POA: Diagnosis not present

## 2024-01-19 DIAGNOSIS — Z6825 Body mass index (BMI) 25.0-25.9, adult: Secondary | ICD-10-CM | POA: Diagnosis not present

## 2024-01-19 DIAGNOSIS — R5383 Other fatigue: Secondary | ICD-10-CM | POA: Diagnosis not present

## 2024-01-19 DIAGNOSIS — E7800 Pure hypercholesterolemia, unspecified: Secondary | ICD-10-CM | POA: Diagnosis not present

## 2024-02-02 DIAGNOSIS — Z23 Encounter for immunization: Secondary | ICD-10-CM | POA: Diagnosis not present

## 2024-02-02 DIAGNOSIS — Z6826 Body mass index (BMI) 26.0-26.9, adult: Secondary | ICD-10-CM | POA: Diagnosis not present

## 2024-02-02 DIAGNOSIS — Z0000 Encounter for general adult medical examination without abnormal findings: Secondary | ICD-10-CM | POA: Diagnosis not present

## 2024-02-16 DIAGNOSIS — M0579 Rheumatoid arthritis with rheumatoid factor of multiple sites without organ or systems involvement: Secondary | ICD-10-CM | POA: Diagnosis not present

## 2024-02-22 DIAGNOSIS — E041 Nontoxic single thyroid nodule: Secondary | ICD-10-CM | POA: Diagnosis not present

## 2024-02-29 DIAGNOSIS — Z8673 Personal history of transient ischemic attack (TIA), and cerebral infarction without residual deficits: Secondary | ICD-10-CM | POA: Diagnosis not present

## 2024-02-29 DIAGNOSIS — E041 Nontoxic single thyroid nodule: Secondary | ICD-10-CM | POA: Diagnosis not present

## 2024-02-29 DIAGNOSIS — E0590 Thyrotoxicosis, unspecified without thyrotoxic crisis or storm: Secondary | ICD-10-CM | POA: Diagnosis not present

## 2024-02-29 DIAGNOSIS — R7303 Prediabetes: Secondary | ICD-10-CM | POA: Diagnosis not present

## 2024-03-02 ENCOUNTER — Other Ambulatory Visit: Payer: Self-pay | Admitting: Family Medicine

## 2024-03-02 DIAGNOSIS — E041 Nontoxic single thyroid nodule: Secondary | ICD-10-CM

## 2024-03-07 ENCOUNTER — Ambulatory Visit
Admission: RE | Admit: 2024-03-07 | Discharge: 2024-03-07 | Disposition: A | Discharge status: Home / Self Care | Source: Ambulatory Visit | Attending: Family Medicine | Admitting: Family Medicine

## 2024-03-07 ENCOUNTER — Other Ambulatory Visit (HOSPITAL_COMMUNITY)
Admission: RE | Admit: 2024-03-07 | Discharge: 2024-03-07 | Disposition: A | Discharge status: Home / Self Care | Source: Ambulatory Visit | Attending: Student | Admitting: Student

## 2024-03-07 DIAGNOSIS — E041 Nontoxic single thyroid nodule: Secondary | ICD-10-CM | POA: Diagnosis not present

## 2024-03-09 LAB — CYTOLOGY - NON PAP

## 2024-03-28 DIAGNOSIS — Z6825 Body mass index (BMI) 25.0-25.9, adult: Secondary | ICD-10-CM | POA: Diagnosis not present

## 2024-03-28 DIAGNOSIS — Z1589 Genetic susceptibility to other disease: Secondary | ICD-10-CM | POA: Diagnosis not present

## 2024-03-28 DIAGNOSIS — M1991 Primary osteoarthritis, unspecified site: Secondary | ICD-10-CM | POA: Diagnosis not present

## 2024-03-28 DIAGNOSIS — M0579 Rheumatoid arthritis with rheumatoid factor of multiple sites without organ or systems involvement: Secondary | ICD-10-CM | POA: Diagnosis not present

## 2024-03-28 DIAGNOSIS — I639 Cerebral infarction, unspecified: Secondary | ICD-10-CM | POA: Diagnosis not present

## 2024-03-28 DIAGNOSIS — E663 Overweight: Secondary | ICD-10-CM | POA: Diagnosis not present

## 2024-03-28 DIAGNOSIS — Z79899 Other long term (current) drug therapy: Secondary | ICD-10-CM | POA: Diagnosis not present

## 2024-05-05 DIAGNOSIS — R7303 Prediabetes: Secondary | ICD-10-CM | POA: Diagnosis not present

## 2024-05-09 DIAGNOSIS — I10 Essential (primary) hypertension: Secondary | ICD-10-CM | POA: Diagnosis not present

## 2024-05-09 DIAGNOSIS — I639 Cerebral infarction, unspecified: Secondary | ICD-10-CM | POA: Diagnosis not present

## 2024-05-09 DIAGNOSIS — E7800 Pure hypercholesterolemia, unspecified: Secondary | ICD-10-CM | POA: Diagnosis not present

## 2024-05-09 DIAGNOSIS — M0579 Rheumatoid arthritis with rheumatoid factor of multiple sites without organ or systems involvement: Secondary | ICD-10-CM | POA: Diagnosis not present

## 2024-05-10 DIAGNOSIS — Z6826 Body mass index (BMI) 26.0-26.9, adult: Secondary | ICD-10-CM | POA: Diagnosis not present

## 2024-05-10 DIAGNOSIS — M25512 Pain in left shoulder: Secondary | ICD-10-CM | POA: Diagnosis not present

## 2024-05-10 DIAGNOSIS — G8929 Other chronic pain: Secondary | ICD-10-CM | POA: Diagnosis not present

## 2024-05-12 ENCOUNTER — Encounter: Payer: PPO | Attending: Physical Medicine & Rehabilitation | Admitting: Physical Medicine & Rehabilitation

## 2024-05-12 ENCOUNTER — Encounter: Payer: Self-pay | Admitting: Physical Medicine & Rehabilitation

## 2024-05-12 VITALS — BP 125/75 | HR 63 | Ht 60.0 in | Wt 137.4 lb

## 2024-05-12 DIAGNOSIS — I69354 Hemiplegia and hemiparesis following cerebral infarction affecting left non-dominant side: Secondary | ICD-10-CM | POA: Diagnosis not present

## 2024-05-12 DIAGNOSIS — M7542 Impingement syndrome of left shoulder: Secondary | ICD-10-CM | POA: Insufficient documentation

## 2024-05-12 NOTE — Progress Notes (Signed)
 Subjective:    Patient ID: Priscilla Wilson, female    DOB: Aug 02, 1951, 73 y.o.   MRN: [id number] 73 y.o. female presented to the [facility] ED on 08/03/2023 with sudden onset of left-sided numbness and weakness. She was emergently evaluated by teleneurology and she was given TNKase . PMH significant for hyperthyroidism on methimazole , asthma, hyperlipidemia, GERD. Methimazole  initiated with improvement in heart rate, anxiety. Metoprolol  ordered with improvement in blood pressure, Cleviprex  off. 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 2D echo LVEF 70-75%. Hgb A1C 6.3%. Now on aspirin  81 mg daily and clopidogrel  75 mg daily for 3 weeks then aspirin  alone.  HPI  CC: LEFT shoulder pain  73 year old female with history of right thalamic capsular infarct approximately 10 months ago started developing increasing left shoulder pain over the last 1 to 2 months, seen by PCP who referred her to PT.  Pt reports having Left rotator cuff problems in the past but has not had any shoulder surgery or shoulder injections. Left shoulder numbness has subsided  She is functionally independent with all self-care and mobility.  She ambulates without an assistive device Bicycling 2 d per wk at [facility] Pain Inventory Average Pain 1 Pain Right Now 3 My pain is intermittent and aching  In the last 24 hours, has pain interfered with the following? General activity 2 Relation with others 0 Enjoyment of life 0 What TIME of day is your pain at its worst? morning , daytime, and night Sleep (in general) Good  Pain is worse with: some activites Pain improves with: rest and pacing activities Relief from Meds: na  Family History  Problem Relation Age of Onset   Cancer - Ovarian Sister    Social History   Socioeconomic History   Marital status: Married    Spouse name:  Not on file   Number of children: Not on file   Years of education: Not on file   Highest education level: Not on file  Occupational History   Not on file  Tobacco Use   Smoking status: Never    Passive exposure: Past   Smokeless tobacco: Never  Vaping Use   Vaping status: Never Used  Substance and Sexual Activity   Alcohol use: Not Currently   Drug use: Never   Sexual activity: Yes  Other Topics Concern   Not on file  Social History Narrative   Not on file   Social Drivers of Health   Financial Resource Strain: Not on file  Food Insecurity: No Food Insecurity (08/05/2023)   Hunger Vital Sign    Worried About Running Out of Food in the Last Year: Never true    Ran Out of Food in the Last Year: Never true  Transportation Needs: No Transportation Needs (08/05/2023)   PRAPARE - Administrator, Civil Service (Medical): No    Lack of Transportation (Non-Medical): No  Physical Activity: Not on file  Stress: Not on file  Social Connections: Not on file   Past Surgical History:  Procedure Laterality Date   BREAST EXCISIONAL BIOPSY Left    benign   Past Surgical History:  Procedure Laterality Date   BREAST EXCISIONAL BIOPSY Left    benign   No past medical history on file. BP 125/75   Pulse 63   Ht 5' (1.524 m)   Wt 137 lb 6.4 oz (62.3 kg)   SpO2 97%  BMI 26.83 kg/m   Opioid Risk Score:   Fall Risk Score:  `1  Depression screen PHQ 2/9     05/12/2024    1:36 PM 11/12/2023    1:40 PM 10/01/2023    1:38 PM 09/02/2023    8:45 AM  Depression screen PHQ 2/9  Decreased Interest 0 0 0 0  Down, Depressed, Hopeless 0 0 0 0  PHQ - 2 Score 0 0 0 0  Altered sleeping    0  Tired, decreased energy    0  Change in appetite    0  Feeling bad or failure about yourself     0  Trouble concentrating    0  Moving slowly or fidgety/restless    0  Suicidal thoughts    0  PHQ-9 Score    0     Review of Systems  Musculoskeletal:  Positive for arthralgias, gait  problem and joint swelling.  All other systems reviewed and are negative.      Objective:   Physical Exam  General no acute distress Mood and affect appropriate Right side is 5/5 in the deltoid bicep tricep grip hip flexor knee extensor ankle dorsiflexion plantarflexion Left side is 4+/5 in the deltoid bicep tricep grip hip flexor knee extensor ankle dorsiflexion plantarflexion Negative straight leg raising bilaterally Positive impingement's test on the left shoulder full active and passive shoulder range of motion, no tenderness palpation along the shoulder no evidence of deformity around the acromioclavicular joint.  No evidence of shoulder subluxation or scapular malrotation. Sensation is normal to pinprick and light touch bilateral upper limbs Finger-nose-finger testing mild dysmetria on the left side Finger thumb opposition is intact bilaterally Tone is normal bilateral upper and lower limbs Ambulates without assistive device no evidence of toe drag or knee instability      Assessment & Plan:   1.  History of right thalamic capsular infarct in September 2024.  She has had a very good recovery.  She still has some motor control issues in the left upper extremity which are likely to be chronic. 2.  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  We discussed the motor control issues that can affect scapulohumeral rhythm and predispose to impingement.  I agree with physical therapy.  I like to see her back after she completes this and if she is still not doing well we may consider corticosteroid injection versus suprascapular nerve block.

## 2024-05-12 NOTE — Patient Instructions (Signed)
 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 These tendons arise from the muscles that stabilize and move the shoulder joint (rotator cuff). Beneath the rotator cuff tendons is a fluid-filled sac (bursa) that allows the tendons to glide smoothly. The bursa may become swollen or irritated (bursitis). Bursitis, swelling in the rotator cuff tendons, or both conditions can decrease the amount of space under a bone in the shoulder joint (acromion), resulting in impingement. What are the causes? This condition may be caused by bursitis or swelling of the rotator cuff tendons, which may result from: Doing repeated overhead arm movements. Falling onto the shoulder. Weakness in the shoulder muscles. What increases the risk? You may be more likely to develop this condition if you: Play sports that involve throwing, such as baseball. Participate in sports such as tennis, volleyball, and swimming. Work as a Education administrator, Music therapist, or Pharmacologist. Some people are also more likely to develop this condition because of the shape of their acromion bone. What are the signs or symptoms? The main symptom of this condition is pain on the front or side of the shoulder. The pain may: Get worse when lifting or raising your arm. Get worse at night. Wake you up from sleeping. Feel sharp when the shoulder is moved and then fade to an ache. Other symptoms may include: Stiffness. Not being able to raise the arm above shoulder level or behind the body. Weakness. How is this diagnosed? This condition may be diagnosed based on: Your symptoms and medical history. A physical exam. Imaging tests, such as: X-rays. MRI. Ultrasound. How is this treated? This condition may be treated by: Resting your shoulder and avoiding all activities that cause pain or  put stress on the shoulder. Putting ice on your shoulder. Taking NSAIDs, such as ibuprofen, to help reduce pain and swelling. Having injections of medicines to numb the area and reduce inflammation. Doing exercises to improve movement and strength in your shoulder (physical therapy). Having surgery. This may be needed if other treatments have not helped. Surgery may involve repairing the rotator cuff, reshaping the acromion, or removing the bursa. Follow these instructions at home: Managing pain, stiffness, and swelling     If told, put ice on the injured area. Put ice in a plastic bag. Place a towel between your skin and the bag. Leave the ice on for 20 minutes, 2-3 times a day. If told, apply heat to the affected area as often as told by your health care provider. Use the heat source that your provider recommends, such as a moist heat pack or a heating pad. Place a towel between your skin and the heat source. Leave the heat on for 20-30 minutes. Remove the heat if your skin turns bright red. This is especially important if you are unable to feel pain, heat, or cold. You have a greater risk of getting burned. If your skin turns bright red, remove the ice or heat right away to prevent skin damage. The risk of damage is higher if you cannot feel pain, heat, or cold. Activity Avoid raising or lifting your arm above your affected shoulder as told. Rest as told by your provider. Return to your normal activities as told by your provider. Ask your provider what activities are safe for you. Do exercises as told by your provider. Ask your provider when it  is safe for you to drive. General instructions Take over-the-counter and prescription medicines only as told by your provider. Do not use any products that contain nicotine or tobacco. These products include cigarettes, chewing tobacco, and vaping devices, such as e-cigarettes. These can delay bone healing. If you need help quitting, ask your  provider. Keep all follow-up visits. Your provider will check on your condition and adjust treatments as needed. How is this prevented? Give your body time to rest between periods of activity. Be safe and responsible while being active. This will help you avoid falls. Maintain physical fitness. This includes strength in your shoulder muscles and back muscles. Contact a health care provider if: You cannot lift your arm away from your body. Your symptoms are getting worse. Your symptoms have not improved after 4-6 months of treatment. This information is not intended to replace advice given to you by your health care provider. Make sure you discuss any questions you have with your health care provider. Document Revised: 07/24/2022 Document Reviewed: 07/24/2022 Elsevier Patient Education  2024 [employer].

## 2024-05-27 DIAGNOSIS — M25512 Pain in left shoulder: Secondary | ICD-10-CM | POA: Diagnosis not present

## 2024-05-30 DIAGNOSIS — M25512 Pain in left shoulder: Secondary | ICD-10-CM | POA: Diagnosis not present

## 2024-06-01 DIAGNOSIS — M25512 Pain in left shoulder: Secondary | ICD-10-CM | POA: Diagnosis not present

## 2024-06-07 DIAGNOSIS — I10 Essential (primary) hypertension: Secondary | ICD-10-CM | POA: Diagnosis not present

## 2024-06-07 DIAGNOSIS — I639 Cerebral infarction, unspecified: Secondary | ICD-10-CM | POA: Diagnosis not present

## 2024-06-09 DIAGNOSIS — M0579 Rheumatoid arthritis with rheumatoid factor of multiple sites without organ or systems involvement: Secondary | ICD-10-CM | POA: Diagnosis not present

## 2024-06-09 DIAGNOSIS — I639 Cerebral infarction, unspecified: Secondary | ICD-10-CM | POA: Diagnosis not present

## 2024-06-09 DIAGNOSIS — I10 Essential (primary) hypertension: Secondary | ICD-10-CM | POA: Diagnosis not present

## 2024-06-09 DIAGNOSIS — E7800 Pure hypercholesterolemia, unspecified: Secondary | ICD-10-CM | POA: Diagnosis not present

## 2024-06-09 DIAGNOSIS — M25512 Pain in left shoulder: Secondary | ICD-10-CM | POA: Diagnosis not present

## 2024-06-13 DIAGNOSIS — M25512 Pain in left shoulder: Secondary | ICD-10-CM | POA: Diagnosis not present

## 2024-06-15 DIAGNOSIS — M25512 Pain in left shoulder: Secondary | ICD-10-CM | POA: Diagnosis not present

## 2024-06-20 DIAGNOSIS — M25512 Pain in left shoulder: Secondary | ICD-10-CM | POA: Diagnosis not present

## 2024-06-23 DIAGNOSIS — M25512 Pain in left shoulder: Secondary | ICD-10-CM | POA: Diagnosis not present

## 2024-06-27 DIAGNOSIS — M25512 Pain in left shoulder: Secondary | ICD-10-CM | POA: Diagnosis not present

## 2024-06-28 DIAGNOSIS — E663 Overweight: Secondary | ICD-10-CM | POA: Diagnosis not present

## 2024-06-28 DIAGNOSIS — M1991 Primary osteoarthritis, unspecified site: Secondary | ICD-10-CM | POA: Diagnosis not present

## 2024-06-28 DIAGNOSIS — Z6825 Body mass index (BMI) 25.0-25.9, adult: Secondary | ICD-10-CM | POA: Diagnosis not present

## 2024-06-28 DIAGNOSIS — Z79899 Other long term (current) drug therapy: Secondary | ICD-10-CM | POA: Diagnosis not present

## 2024-06-28 DIAGNOSIS — M0579 Rheumatoid arthritis with rheumatoid factor of multiple sites without organ or systems involvement: Secondary | ICD-10-CM | POA: Diagnosis not present

## 2024-06-28 DIAGNOSIS — Z1589 Genetic susceptibility to other disease: Secondary | ICD-10-CM | POA: Diagnosis not present

## 2024-06-28 NOTE — Progress Notes (Unsigned)
 Cardiology Office Note:    Date:  06/29/2024   ID:  Priscilla Wilson, DOB July 03, 1951, MRN [id number]  PCP:  Cleotilde Planas, MD  Cardiologist:  Lonni LITTIE Nanas, MD  Electrophysiologist:  None   Referring MD: Cleotilde Planas, MD   Chief Complaint  Patient presents with   Cerebrovascular Accident    History of Present Illness:    Priscilla Wilson is a 73 y.o. female with a hx of CVA, hypothyroidism, asthma, hyperlipidemia, GERD who presents for follow-up.  He was referred by Dr. Cleotilde for evaluation of CVA, initially seen 09/2023.  She was admitted 07/2023 with acute CVA.  Echocardiogram showed EF 70 to 75%, hyperdynamic RV function, no significant valvular disease.  Discharged with 30-day monitor.  Plan for aspirin  plus Plavix  x 3 weeks, then aspirin  alone.  30-day monitor showed no significant arrhythmias.  Since last clinic visit, she reports she is doing well.  She denies any chest pain, dyspnea, or palpitations.  Does report some lightheadedness but denies any syncope.  Does report some swelling in her ankles.  She goes to [facility] 3 days/week for an hour and does PT twice per week.  Still having some left-sided weakness.    No past medical history on file.  Past Surgical History:  Procedure Laterality Date   BREAST EXCISIONAL BIOPSY Left    benign    Current Medications: Current Meds  Medication Sig   acetaminophen  (TYLENOL ) 325 MG tablet 1-2 tablets as needed for mild pain Orally every 4 hrs   aspirin  EC 81 MG tablet 1 tablet Orally Once a day   folic acid (FOLVITE) 1 MG tablet 2 tabs Orally Once a day for 30 days   hydroxychloroquine (PLAQUENIL) 200 MG tablet 1 tab Orally twice a day for 30 days   melatonin 5 MG TABS 1 tablet in the evening Orally Once a day   methimazole  (TAPAZOLE ) 5 MG tablet 1 tablet Orally Once a day for 90 days   methotrexate (RHEUMATREX) 2.5 MG tablet Take 10 mg by mouth once a week.   metoprolol  tartrate (LOPRESSOR ) 25 MG tablet 1 tablet with food  Orally Twice a day   Multiple Vitamin (MULTI VITAMIN) TABS Take 1 tablet by mouth daily.   rosuvastatin  (CRESTOR ) 20 MG tablet 1 tablet Orally Once a day   venlafaxine  XR (EFFEXOR -XR) 75 MG 24 hr capsule Take 75 mg by mouth daily with breakfast.     Allergies:   Cat dander, Shellfish allergy , and Sulfa antibiotics   Social History   Socioeconomic History   Marital status: Married    Spouse name: Not on file   Number of children: Not on file   Years of education: Not on file   Highest education level: Not on file  Occupational History   Not on file  Tobacco Use   Smoking status: Never    Passive exposure: Past   Smokeless tobacco: Never  Vaping Use   Vaping status: Never Used  Substance and Sexual Activity   Alcohol use: Not Currently   Drug use: Never   Sexual activity: Yes  Other Topics Concern   Not on file  Social History Narrative   Not on file   Social Drivers of Health   Financial Resource Strain: Not on file  Food Insecurity: No Food Insecurity (08/05/2023)   Hunger Vital Sign    Worried About Running Out of Food in the Last Year: Never true    Ran Out of Food in the Last Year: Never true  Transportation Needs: No Transportation Needs (08/05/2023)   PRAPARE - Administrator, Civil Service (Medical): No    Lack of Transportation (Non-Medical): No  Physical Activity: Not on file  Stress: Not on file  Social Connections: Not on file     Family History: The patient's family history includes Cancer - Ovarian in her sister.  ROS:   Please see the history of present illness.     All other systems reviewed and are negative.  EKGs/Labs/Other Studies Reviewed:    The following studies were reviewed today:   EKG:   09/25/2023: Normal sinus rhythm, rate 62, Q waves in leads aVF and III 06/29/24: NSR, inferior Q waves, poor R wave progression, rate 70  Recent Labs: 08/04/2023: TSH 0.286 08/11/2023: ALT 21 08/17/2023: BUN 23; Creatinine, Ser 1.03;  Hemoglobin 14.6; Platelets 240; Potassium 4.3; Sodium 138  Recent Lipid Panel    Component Value Date/Time   CHOL 166 08/04/2023 0319   TRIG 78 08/04/2023 0319   HDL 62 08/04/2023 0319   CHOLHDL 2.7 08/04/2023 0319   VLDL 16 08/04/2023 0319   LDLCALC 88 08/04/2023 0319    Physical Exam:    VS:  BP 127/81   Pulse 66   Ht 5' (1.524 m)   Wt 136 lb 3.2 oz (61.8 kg)   SpO2 98%   BMI 26.60 kg/m     Wt Readings from Last 3 Encounters:  06/29/24 136 lb 3.2 oz (61.8 kg)  05/12/24 137 lb 6.4 oz (62.3 kg)  11/12/23 136 lb 3.2 oz (61.8 kg)     GEN:  Well nourished, well developed in no acute distress HEENT: Normal NECK: No JVD; No carotid bruits LYMPHATICS: No lymphadenopathy CARDIAC: RRR, no murmurs, rubs, gallops RESPIRATORY:  Clear to auscultation without rales, wheezing or rhonchi  ABDOMEN: Soft, non-tender, non-distended MUSCULOSKELETAL:  No edema; No deformity  SKIN: Warm and dry NEUROLOGIC:  Alert and oriented x 3 PSYCHIATRIC:  Normal affect   ASSESSMENT:    1. Cerebrovascular accident (CVA), unspecified mechanism (HCC)   2. Essential hypertension   3. Hyperlipidemia, unspecified hyperlipidemia type     PLAN:    CVA: admitted 07/2023 with acute CVA.  Echocardiogram showed EF 70 to 75%, hyperdynamic RV function, no significant valvular disease.  30-day monitor showed no significant arrhythmias. -Continue ASA, statin  Hypertension: On metoprolol  25 mg twice daily. Appears controlled  Hyperlipidemia: On rosuvastatin  20 mg daily.  LDL 38 on 10/13/23  Hyperthyroidism: On methimazole , follows with endocrinology  RTC in 1 year   Medication Adjustments/Labs and Tests Ordered: Current medicines are reviewed at length with the patient today.  Concerns regarding medicines are outlined above.  Orders Placed This Encounter  Procedures   EKG 12-Lead   No orders of the defined types were placed in this encounter.   Patient Instructions  Medication Instructions:  Your  physician recommends that you continue on your current medications as directed. Please refer to the Current Medication list given to you today.  *If you need a refill on your cardiac medications before your next appointment, please call your pharmacy*  Lab Work: NONE ordered at this time of appointment   Testing/Procedures: NONE ordered at this time of appointment   Follow-Up: At [facility], you and your health needs are our priority.  As part of our continuing mission to provide you with exceptional heart care, our providers are all part of one team.  This team includes your primary Cardiologist (physician) and Advanced Practice  Providers or APPs (Physician Assistants and Nurse Practitioners) who all work together to provide you with the care you need, when you need it.  Your next appointment:   1 year(s)  Provider:   Lonni LITTIE Nanas, MD    We recommend signing up for the patient portal called MyChart.  Sign up information is provided on this After Visit Summary.  MyChart is used to connect with patients for Virtual Visits (Telemedicine).  Patients are able to view lab/test results, encounter notes, upcoming appointments, etc.  Non-urgent messages can be sent to your provider as well.   To learn more about what you can do with MyChart, go to [email or web address].          Signed, Lonni LITTIE Nanas, MD  06/29/2024 9:10 PM    [facility]

## 2024-06-29 ENCOUNTER — Encounter: Payer: Self-pay | Admitting: Cardiology

## 2024-06-29 ENCOUNTER — Ambulatory Visit: Attending: Cardiology | Admitting: Cardiology

## 2024-06-29 VITALS — BP 127/81 | HR 66 | Ht 60.0 in | Wt 136.2 lb

## 2024-06-29 DIAGNOSIS — I639 Cerebral infarction, unspecified: Secondary | ICD-10-CM | POA: Diagnosis not present

## 2024-06-29 DIAGNOSIS — I10 Essential (primary) hypertension: Secondary | ICD-10-CM

## 2024-06-29 DIAGNOSIS — E785 Hyperlipidemia, unspecified: Secondary | ICD-10-CM | POA: Diagnosis not present

## 2024-06-29 NOTE — Patient Instructions (Signed)
 Medication Instructions:  Your physician recommends that you continue on your current medications as directed. Please refer to the Current Medication list given to you today.  *If you need a refill on your cardiac medications before your next appointment, please call your pharmacy*  Lab Work: NONE ordered at this time of appointment   Testing/Procedures: NONE ordered at this time of appointment   Follow-Up: At [facility], you and your health needs are our priority.  As part of our continuing mission to provide you with exceptional heart care, our providers are all part of one team.  This team includes your primary Cardiologist (physician) and Advanced Practice Providers or APPs (Physician Assistants and Nurse Practitioners) who all work together to provide you with the care you need, when you need it.  Your next appointment:   1 year(s)  Provider:   Wendie Hamburg, MD    We recommend signing up for the patient portal called "MyChart".  Sign up information is provided on this After Visit Summary.  MyChart is used to connect with patients for Virtual Visits (Telemedicine).  Patients are able to view lab/test results, encounter notes, upcoming appointments, etc.  Non-urgent messages can be sent to your provider as well.   To learn more about what you can do with MyChart, go to [email or web address].

## 2024-06-30 DIAGNOSIS — M25512 Pain in left shoulder: Secondary | ICD-10-CM | POA: Diagnosis not present

## 2024-07-03 ENCOUNTER — Encounter (HOSPITAL_BASED_OUTPATIENT_CLINIC_OR_DEPARTMENT_OTHER): Payer: Self-pay

## 2024-07-03 ENCOUNTER — Emergency Department (HOSPITAL_BASED_OUTPATIENT_CLINIC_OR_DEPARTMENT_OTHER)
Admission: EM | Admit: 2024-07-03 | Discharge: 2024-07-04 | Disposition: A | Discharge status: Home / Self Care | Attending: Emergency Medicine | Admitting: Emergency Medicine

## 2024-07-03 ENCOUNTER — Other Ambulatory Visit: Payer: Self-pay

## 2024-07-03 ENCOUNTER — Emergency Department (HOSPITAL_BASED_OUTPATIENT_CLINIC_OR_DEPARTMENT_OTHER): Admitting: Radiology

## 2024-07-03 DIAGNOSIS — Z8673 Personal history of transient ischemic attack (TIA), and cerebral infarction without residual deficits: Secondary | ICD-10-CM | POA: Insufficient documentation

## 2024-07-03 DIAGNOSIS — E039 Hypothyroidism, unspecified: Secondary | ICD-10-CM | POA: Insufficient documentation

## 2024-07-03 DIAGNOSIS — Z7982 Long term (current) use of aspirin: Secondary | ICD-10-CM | POA: Insufficient documentation

## 2024-07-03 DIAGNOSIS — M62830 Muscle spasm of back: Secondary | ICD-10-CM | POA: Insufficient documentation

## 2024-07-03 DIAGNOSIS — M4317 Spondylolisthesis, lumbosacral region: Secondary | ICD-10-CM | POA: Diagnosis not present

## 2024-07-03 DIAGNOSIS — M545 Low back pain, unspecified: Secondary | ICD-10-CM | POA: Diagnosis present

## 2024-07-03 DIAGNOSIS — M47816 Spondylosis without myelopathy or radiculopathy, lumbar region: Secondary | ICD-10-CM | POA: Diagnosis not present

## 2024-07-03 MED ORDER — DIAZEPAM 2 MG PO TABS
2.0000 mg | ORAL_TABLET | Freq: Once | ORAL | Status: AC
  Administered 2024-07-03: 2 mg via ORAL
  Filled 2024-07-03: qty 1

## 2024-07-03 MED ORDER — KETOROLAC TROMETHAMINE 60 MG/2ML IM SOLN
30.0000 mg | Freq: Once | INTRAMUSCULAR | Status: AC
Start: 2024-07-03 — End: 2024-07-03
  Administered 2024-07-03: 30 mg via INTRAMUSCULAR
  Filled 2024-07-03: qty 2

## 2024-07-03 NOTE — ED Triage Notes (Addendum)
 Lower back pain describing as spasms first noticed Friday evening. Pain exacerbated with movement.   Pain radiates across to lower abdomen. Denies any urinary symptoms.   Took 500mg  tylenol  earlier with slight improvement.

## 2024-07-03 NOTE — ED Provider Notes (Signed)
 [place] EMERGENCY DEPARTMENT AT [facility] Provider Note   CSN: [id number] Arrival date & time: 07/03/24  2003     Patient presents with: Back Pain   Priscilla Wilson is a 73 y.o. female who presents emergency department with chief complaint of back pain and spasm.  Patient reports a history of rheumatoid arthritis on methotrexate and hydroxychloroquine, hyperthyroidism on methimazole , history of thalamic stroke with left-sided weakness which is mild.  She reports that she thinks she might of strained her back bicycling for a longer than normal period of time at the [facility] 2 days ago.  She woke up Saturday with lower back pain it hurts to move or change position.  She states that since that time the pain has significantly worsened and now she is having gripping, stabbing, severe spasms in her back that occur at random.  She continues to have pain with any change in position.  She denies saddle anesthesia loss of bowel or bladder control weakness in the lower extremities.  She denies any urinary symptoms, history of kidney stones or flank pain.  {Add pertinent medical, surgical, social history, OB history to HPI:32947}  Back Pain      Prior to Admission medications   Medication Sig Start Date End Date Taking? Authorizing Provider  acetaminophen  (TYLENOL ) 325 MG tablet 1-2 tablets as needed for mild pain Orally every 4 hrs 08/21/23   [provider]  aspirin  EC 81 MG tablet 1 tablet Orally Once a day 08/21/23   [provider]  famotidine  (PEPCID ) 40 MG tablet Take 1 tablet (40 mg total) by mouth at bedtime. Patient not taking: Reported on 06/29/2024 06/30/23   Iva Marty Saltness, MD  folic acid (FOLVITE) 1 MG tablet 2 tabs Orally Once a day for 30 days 01/19/24   [provider]  hydroxychloroquine (PLAQUENIL) 200 MG tablet 1 tab Orally twice a day for 30 days 09/30/23   [provider]  melatonin 5 MG TABS 1 tablet in the evening Orally Once a  day 08/21/23   [provider]  methimazole  (TAPAZOLE ) 5 MG tablet 1 tablet Orally Once a day for 90 days    [provider]  methotrexate (RHEUMATREX) 2.5 MG tablet Take 10 mg by mouth once a week.    [provider]  metoprolol  tartrate (LOPRESSOR ) 25 MG tablet 1 tablet with food Orally Twice a day 08/21/23   [provider]  Multiple Vitamin (MULTI VITAMIN) TABS Take 1 tablet by mouth daily.    [provider]  rosuvastatin  (CRESTOR ) 20 MG tablet 1 tablet Orally Once a day 08/21/23   [provider]  venlafaxine  XR (EFFEXOR -XR) 75 MG 24 hr capsule Take 75 mg by mouth daily with breakfast.    [provider]    Allergies: Cat dander, Shellfish allergy , and Sulfa antibiotics    Review of Systems  Musculoskeletal:  Positive for back pain.    Updated Vital Signs BP (!) 141/81 (BP Location: Left Arm)   Pulse 79   Temp 98.8 F (37.1 C)   Resp 19   Ht 5' (1.524 m)   Wt 61.2 kg   SpO2 98%   BMI 26.37 kg/m   Physical Exam Vitals and nursing note reviewed.  Constitutional:      General: She is not in acute distress.    Appearance: She is well-developed. She is not diaphoretic.     Comments: Patient appears intermittently extremely uncomfortable.  Patient grabs her left back winces with pain  and cannot speak for moments at a time.  HENT:     Head: Normocephalic and atraumatic.     Right Ear: External ear normal.     Left Ear: External ear normal.     Nose: Nose normal.     Mouth/Throat:     Mouth: Mucous membranes are moist.  Eyes:     General: No scleral icterus.    Conjunctiva/sclera: Conjunctivae normal.  Cardiovascular:     Rate and Rhythm: Normal rate and regular rhythm.     Heart sounds: Normal heart sounds. No murmur heard.    No friction rub. No gallop.  Pulmonary:     Effort: Pulmonary effort is normal. No respiratory distress.     Breath sounds: Normal breath sounds.  Abdominal:     General: Bowel  sounds are normal. There is no distension.     Palpations: Abdomen is soft. There is no mass.     Tenderness: There is no abdominal tenderness. There is no guarding.  Musculoskeletal:     Cervical back: Normal range of motion.     Comments: Patient has no midline spinal tenderness.  She is unable to tolerate range of motion at this time.  She has palpable spasm bilateral lumbar paraspinals, left greater than right, she has normal strength with the lower extremities however there are some minor weakness on the left which is chronic per her previous thalamic stroke, no new deficits.  Skin:    General: Skin is warm and dry.  Neurological:     Mental Status: She is alert and oriented to person, place, and time.  Psychiatric:        Behavior: Behavior normal.     (all labs ordered are listed, but only abnormal results are displayed) Labs Reviewed - No data to display  EKG: None  Radiology: No results found.  {Document cardiac monitor, telemetry assessment procedure when appropriate:32947} Procedures   Medications Ordered in the ED  ketorolac  (TORADOL ) injection 30 mg (has no administration in time range)  diazepam  (VALIUM ) tablet 2 mg (has no administration in time range)      {Click here for ABCD2, HEART and other calculators REFRESH Note before signing:1}                              Medical Decision Making Amount and/or Complexity of Data Reviewed Radiology: ordered.  Risk Prescription drug management.   ***  {Document critical care time when appropriate  Document review of labs and clinical decision tools ie CHADS2VASC2, etc  Document your independent review of radiology images and any outside records  Document your discussion with family members, caretakers and with consultants  Document social determinants of health affecting pt's care  Document your decision making why or why not admission, treatments were needed:32947:::1}   Final diagnoses:  None    ED  Discharge Orders     None

## 2024-07-04 MED ORDER — DIAZEPAM 2 MG PO TABS
2.0000 mg | ORAL_TABLET | Freq: Four times a day (QID) | ORAL | 0 refills | Status: DC | PRN
Start: 2024-07-03 — End: 2024-08-12

## 2024-07-04 NOTE — ED Notes (Signed)
 Pt discharged home after verbalizing understanding of discharge instructions; nad noted.

## 2024-07-04 NOTE — Discharge Instructions (Addendum)
 ### Acute Back Spasm Care     **Understanding Your Back Condition**      You have been diagnosed with an acute lumbar back spasm, along with changes in your spine called degenerative disc disease, osteoarthritis, and spondylolysis. These are common in older adults and can cause pain, stiffness, and muscle spasms. Most people recover well with simple treatments and time.[1][2][3][4]      **What Was Done in the Emergency Room**      You received diazepam  (a muscle relaxant) and acetaminophen  (a pain reliever) to help with your pain and muscle spasm. These medications can help you feel more comfortable while your back heals.[5]      **Home Supportive Measures**      - **Stay Active:** Try to keep moving as much as you can, even if you have some pain. Gentle walking and light activity are better than staying in bed. Avoid heavy lifting or twisting.[2][6][3][4]      - **Avoid Bed Rest:** Lying in bed for long periods can slow your recovery. Short periods of rest are okay if needed, but get up and move regularly.[2][3][4]      - **Heat Therapy:** Using a heating pad or warm towel on your lower back for 15-20 minutes at a time may help relax muscles and ease pain. Make sure the heat is not too hot to avoid burns.[1][4]      - **Medication Safety:** Take acetaminophen  as directed for pain. Use diazepam  only as prescribed, and for a short time, as it can cause drowsiness, confusion, or falls, especially in older adults. Do not drive or operate machinery while taking diazepam .[5]      - **Safe Movement:** Move slowly and carefully. Use support when getting up from a chair or bed. If you feel dizzy or unsteady, ask for help.      **Physical Therapy**      Physical therapy can help you recover faster and prevent future problems. A physical therapist can teach you gentle exercises to stretch and strengthen your back and core muscles, improve your posture, and help you move safely. Programs like the  McKenzie method or core stabilization exercises are often used, but the best program is one that fits your needs and abilities.[2][6][3]      **Follow-Up**      Schedule a follow-up appointment with your primary care provider within the next 1-2 weeks. Your doctor will check your progress, review your medications, and may refer you to physical therapy if needed.[1][2][3]      **When to Seek Immediate Help (Return Precautions)**      Call your doctor or go to the emergency room right away if you experience:      - New or worsening weakness in your legs      - Numbness or tingling in your groin or inner thighs      - Loss of bladder or bowel control (trouble peeing or pooping)      - Severe pain after a fall or injury      - Fever, chills, or unexplained weight loss      These symptoms could be signs of a more serious problem and need urgent attention.[6][3][4]      **What to Expect**      Most people with acute back spasm improve within a few weeks. Staying active, using heat, and following your medication instructions will help you recover. Physical therapy and regular follow-up are important for long-term back health.[1][2][6][3][4]      **Questions?**  If you have questions about your medications, exercises, or symptoms, contact your primary care provider.      ### References  1. Nonspecific Low Back Pain. Chiarotto A, Koes BW. The [place] Journal of Medicine. 2022;386(18):1732-1740. doi:10.1056/NEJMcp2032396. 2. Low Back Pain. Delitto DELENA Bare SZ, Fleeta Olena CROME, et al. The Journal of Orthopaedic and Sports Physical Therapy. 2012;42(4):A1-57. doi:10.2519/jospt.2012.42.4.A1. 3. Diagnosis and Treatment of Acute Low Back Pain. Casazza BA. American Family Physician. 2012;85(4):343-50. 4. Evaluation and Treatment of Acute Low Back Pain. Kinkade S. American Family Physician. 2007;75(8):1181-8. 5. Pharmacotherapy for Spine-Related Pain in Older Adults. Angele ORLINDA Bailiff MD. Drugs  & Aging. 2022;39(7):523-550. doi:10.1007/s40266-022-00946-x. 6. Mechanical Low Back Pain. Will JS, Darci GUTHRIE, [employer]. American Family Physician. 2018;98(7):421-428.

## 2024-07-05 DIAGNOSIS — M25512 Pain in left shoulder: Secondary | ICD-10-CM | POA: Diagnosis not present

## 2024-07-07 DIAGNOSIS — M25512 Pain in left shoulder: Secondary | ICD-10-CM | POA: Diagnosis not present

## 2024-07-07 DIAGNOSIS — I639 Cerebral infarction, unspecified: Secondary | ICD-10-CM | POA: Diagnosis not present

## 2024-07-07 DIAGNOSIS — I10 Essential (primary) hypertension: Secondary | ICD-10-CM | POA: Diagnosis not present

## 2024-07-10 DIAGNOSIS — I10 Essential (primary) hypertension: Secondary | ICD-10-CM | POA: Diagnosis not present

## 2024-07-10 DIAGNOSIS — E7800 Pure hypercholesterolemia, unspecified: Secondary | ICD-10-CM | POA: Diagnosis not present

## 2024-07-10 DIAGNOSIS — I639 Cerebral infarction, unspecified: Secondary | ICD-10-CM | POA: Diagnosis not present

## 2024-07-10 DIAGNOSIS — M0579 Rheumatoid arthritis with rheumatoid factor of multiple sites without organ or systems involvement: Secondary | ICD-10-CM | POA: Diagnosis not present

## 2024-07-13 DIAGNOSIS — R7303 Prediabetes: Secondary | ICD-10-CM | POA: Diagnosis not present

## 2024-07-13 DIAGNOSIS — E041 Nontoxic single thyroid nodule: Secondary | ICD-10-CM | POA: Diagnosis not present

## 2024-07-13 DIAGNOSIS — I10 Essential (primary) hypertension: Secondary | ICD-10-CM | POA: Diagnosis not present

## 2024-07-13 DIAGNOSIS — E0590 Thyrotoxicosis, unspecified without thyrotoxic crisis or storm: Secondary | ICD-10-CM | POA: Diagnosis not present

## 2024-07-21 DIAGNOSIS — E7800 Pure hypercholesterolemia, unspecified: Secondary | ICD-10-CM | POA: Diagnosis not present

## 2024-07-21 DIAGNOSIS — F325 Major depressive disorder, single episode, in full remission: Secondary | ICD-10-CM | POA: Diagnosis not present

## 2024-07-21 DIAGNOSIS — I10 Essential (primary) hypertension: Secondary | ICD-10-CM | POA: Diagnosis not present

## 2024-07-21 DIAGNOSIS — Z8673 Personal history of transient ischemic attack (TIA), and cerebral infarction without residual deficits: Secondary | ICD-10-CM | POA: Diagnosis not present

## 2024-07-21 DIAGNOSIS — E663 Overweight: Secondary | ICD-10-CM | POA: Diagnosis not present

## 2024-07-21 DIAGNOSIS — Z23 Encounter for immunization: Secondary | ICD-10-CM | POA: Diagnosis not present

## 2024-07-21 DIAGNOSIS — M0579 Rheumatoid arthritis with rheumatoid factor of multiple sites without organ or systems involvement: Secondary | ICD-10-CM | POA: Diagnosis not present

## 2024-07-21 DIAGNOSIS — Z6826 Body mass index (BMI) 26.0-26.9, adult: Secondary | ICD-10-CM | POA: Diagnosis not present

## 2024-07-22 DIAGNOSIS — M25512 Pain in left shoulder: Secondary | ICD-10-CM | POA: Diagnosis not present

## 2024-07-26 ENCOUNTER — Ambulatory Visit (INDEPENDENT_AMBULATORY_CARE_PROVIDER_SITE_OTHER): Payer: PPO | Admitting: Allergy & Immunology

## 2024-07-26 ENCOUNTER — Other Ambulatory Visit: Payer: Self-pay

## 2024-07-26 ENCOUNTER — Encounter: Payer: Self-pay | Admitting: Allergy & Immunology

## 2024-07-26 VITALS — BP 104/68 | HR 62 | Temp 98.2°F | Resp 16 | Ht 60.0 in | Wt 133.9 lb

## 2024-07-26 DIAGNOSIS — T7800XD Anaphylactic reaction due to unspecified food, subsequent encounter: Secondary | ICD-10-CM

## 2024-07-26 DIAGNOSIS — J4530 Mild persistent asthma, uncomplicated: Secondary | ICD-10-CM

## 2024-07-26 DIAGNOSIS — J3089 Other allergic rhinitis: Secondary | ICD-10-CM | POA: Diagnosis not present

## 2024-07-26 NOTE — Patient Instructions (Addendum)
 1. Mild persistent asthma, uncomplicated - Lung testing not done today.  - It seems that your symptoms are under excellent  control without any medications. - Daily controller medication(s): NOTHING - Prior to physical activity: albuterol  2 puffs 10-15 minutes before physical activity. - Rescue medications: albuterol  4 puffs every 4-6 hours as needed - Changes during respiratory infections or worsening symptoms: ADD ON Advair to 1 puff twice daily for TWO WEEKS. - Asthma control goals:  * Full participation in all desired activities (may need albuterol  before activity) * Albuterol  use two time or less a week on average (not counting use with activity) * Cough interfering with sleep two time or less a month * Oral steroids no more than once a year * No hospitalizations  2. Chronic rhinitis (dust mites and cat) - It seems that you have excellent control of your symptoms.  - Continue with: Zyrtec  (cetirizine ) 10mg  tablet once daily as needed and the Flonase  as needed - You can use an extra dose of the antihistamine, if needed, for breakthrough symptoms.  - Consider nasal saline rinses 1-2 times daily to remove allergens from the nasal cavities as well as help with mucous clearance (this is especially helpful to do before the nasal sprays are given)  3. Mollusk allergy  - Continue to avoid mollusks. - EpiPen is up to date.   4. GERD - Continue with famotidine  40mg  at night. - EpiPen refilled today.   5. Return if symptoms worsen or fail to improve.   Please inform us  of any Emergency Department visits, hospitalizations, or changes in symptoms. Call us  before going to the ED for breathing or allergy  symptoms since we might be able to fit you in for a sick visit. Feel free to contact us  anytime with any questions, problems, or concerns.  It was a pleasure to see you again today!  Websites that have reliable patient information: 1. American Academy of Asthma, Allergy , and Immunology:  www.aaaai.org 2. Food Allergy  Research and Education (FARE): foodallergy.org 3. Mothers of Asthmatics: http://www.asthmacommunitynetwork.org 4. American College of Allergy , Asthma, and Immunology: www.acaai.org      "Like" us  on Facebook and Instagram for our latest updates!      A healthy democracy works best when [employer] participate! Make sure you are registered to vote! If you have moved or changed any of your contact information, you will need to get this updated before voting! Scan the QR codes below to learn more!

## 2024-07-26 NOTE — Progress Notes (Unsigned)
 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 Ishmael)   Hyperthyroidism - improved on methimazole  (followed by Dr. Braulio)   Hypercholesterolemia - on simvastatin    GERD - controlled with famotidine   Plan/Recommendations:   1. Mild persistent asthma, uncomplicated - Lung testing not done today.  - It seems that your symptoms are under excellent  control without any medications. - Daily controller medication(s): NOTHING - Prior to physical activity: albuterol  2 puffs 10-15 minutes before physical activity. - Rescue medications: albuterol  4 puffs every 4-6 hours as needed - Changes during respiratory infections or worsening symptoms: ADD ON Advair to 1 puff twice daily for TWO WEEKS. - Asthma control goals:  * Full participation in all desired activities (may need albuterol  before activity) * Albuterol  use two time or less a week on average (not counting use with activity) * Cough interfering with sleep two time or less a month * Oral steroids no more than once a year * No hospitalizations  2. Chronic rhinitis (dust mites and cat) - It seems that you have excellent control of your symptoms.  - Continue with: Zyrtec  (cetirizine ) 10mg  tablet once daily as needed and the Flonase  as needed - You can use an extra dose of the antihistamine, if needed, for breakthrough symptoms.  - Consider nasal saline rinses 1-2 times daily to remove allergens from the nasal cavities as well as help with mucous clearance (this is especially helpful to do before the nasal sprays are given)  3. Mollusk allergy  - Continue  to avoid mollusks. - EpiPen is up to date.   4. GERD - Continue with famotidine  40mg  at night. - EpiPen refilled today.   5. Return if symptoms worsen or fail to improve.   Subjective:   Priscilla Wilson is a 73 y.o. female presenting today for follow up of  Chief Complaint  Patient presents with  . Follow-up  . Asthma    She is doing good. She does not have any concern today.    Priscilla Wilson has a history of the following: Patient Active Problem List   Diagnosis Date Noted  . Essential hypertension 08/13/2023  . 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 Acute CVA (cerebrovascular accident) (HCC) 08/10/2023  . TIA (transient ischemic attack) 08/03/2023  . Stroke determined by clinical assessment (HCC) 08/03/2023    History obtained from: chart review and patient.  Discussed the use of AI scribe software for clinical note transcription with the patient and/or guardian, who gave verbal consent to proceed.  [name] is a 73 y.o. female presenting for a follow up visit.  She was last seen in September 2024.  At that time, lung testing looked great.  We recommended starting Advair for a couple of weeks since she was having some breakthrough asthma symptoms.  She also continue with albuterol  as needed.  We started her on a prednisone  taper and we asked her to call us  with an update a few days after.  We added on Mucinex  twice daily.  For her rhinitis, she was doing well with Zyrtec  and the Flonase .  She continue to avoid shellfish.  GERD was controlled with  famotidine .  Since last visit, she has had an eventful course, to say the least.    She experienced a stroke shortly after her last visit a year ago, during which she was initially treated for an asthma flare with steroids. She was hospitalized, received thrombolytic therapy, and was admitted to the ICU. She then transitioned to step-down care and subsequently to inpatient rehabilitation for two weeks, followed by outpatient  rehabilitation. She was discharged from the hospital on August 21, 2019.  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 Initially, she used a walker, then progressed to a cane, and now she no longer needs any assistive devices. Her arm occasionally 'wavers a little bit,' but overall, she feels she is doing well.  She is currently taking methimazole , which she started in December 2023, and continues to take famotidine  for reflux. She was on Plavix  for a month post-stroke and now takes one aspirin  daily. She is also on blood pressure medication.     Asthma/Respiratory Symptom History: Regarding her asthma, she has not experienced any issues since the stroke and has not needed to use albuterol  or antihistamines. She did not complete the steroid course prescribed a year ago due to the stroke but reports no asthma symptoms since then. Priscilla Wilson's asthma has been well controlled. She has not required rescue medication, experienced nocturnal awakenings due to lower respiratory symptoms, nor have activities of daily living been limited. She has required no Emergency Department or Urgent Care visits for her asthma. She has required zero courses of systemic steroids for asthma exacerbations since the last visit. ACT score today is 25, indicating excellent asthma symptom control.   Allergic Rhinitis Symptom History: She is not using Zyrtec  or Flonase .  Overall, she has done very well.  She has not been on antibiotics at all since last visit.  Otherwise, there have been no changes to her past medical history, surgical history, family history, or social history.    Review of systems otherwise negative other than that mentioned in the HPI.    Objective:   Blood pressure 104/68, pulse 62, temperature 98.2 F (36.8 C), temperature source Temporal, resp. rate 16, height 5' (1.524 m), weight 133 lb 14.4 oz (60.7 kg), SpO2 98%. Body mass index is 26.15 kg/m.    Physical Exam Vitals  reviewed.  Constitutional:      Appearance: She is well-developed.     Comments: Pleasant. Smiling.   HENT:     Head: Normocephalic and atraumatic.     Right Ear: Tympanic membrane, ear canal and external ear normal. No drainage, swelling or tenderness. Tympanic membrane is not injected, scarred, erythematous, retracted or bulging.     Left Ear: Tympanic membrane, ear canal and external ear normal. No drainage, swelling or tenderness. Tympanic membrane is not injected, scarred, erythematous, retracted or bulging.     Nose: No nasal deformity, septal deviation, mucosal edema or rhinorrhea.     Right Turbinates: Enlarged and swollen. Not pale.     Left Turbinates: Enlarged and swollen. Not pale.     Right Sinus: No maxillary sinus tenderness or frontal sinus tenderness.     Left Sinus: No maxillary sinus tenderness or frontal sinus tenderness.     Comments: No nasal polyps.    Mouth/Throat:     Lips: Pink.     Mouth: Mucous membranes are moist. Mucous membranes are not pale and not dry.     Pharynx: Uvula midline.     Comments: Minimal  cobblestoning. Eyes:     General:        Right eye: No discharge.        Left eye: No discharge.     Conjunctiva/sclera: Conjunctivae normal.     Right eye: Right conjunctiva is not injected. No chemosis.    Left eye: Left conjunctiva is not injected. No chemosis.    Pupils: Pupils are equal, round, and reactive to light.  Cardiovascular:     Rate and Rhythm: Normal rate and regular rhythm.     Heart sounds: Normal heart sounds.  Pulmonary:     Effort: Pulmonary effort is normal. No tachypnea, accessory muscle usage or respiratory distress.     Breath sounds: Normal breath sounds. No wheezing, rhonchi or rales.  Chest:     Chest wall: No tenderness.  Abdominal:     Tenderness: There is no abdominal tenderness. There is no guarding or rebound.  Lymphadenopathy:     Head:     Right side of head: No submandibular, tonsillar or occipital adenopathy.      Left side of head: No submandibular, tonsillar or occipital adenopathy.     Cervical: No cervical adenopathy.  Skin:    Coloration: Skin is not pale.     Findings: No abrasion, erythema, petechiae or rash. Rash is not papular, urticarial or vesicular.  Neurological:     Mental Status: She is alert.  Psychiatric:        Behavior: Behavior is cooperative.      Diagnostic studies:    Spirometry: results normal (FEV1: 1.96/110%, FVC: 2.53/110%, FEV1/FVC: 77%).    Spirometry consistent with normal pattern.   Allergy  Studies: none       Marty Shaggy, MD  [facility] 

## 2024-07-27 ENCOUNTER — Encounter: Payer: Self-pay | Admitting: Allergy & Immunology

## 2024-07-27 DIAGNOSIS — M25512 Pain in left shoulder: Secondary | ICD-10-CM | POA: Diagnosis not present

## 2024-07-28 DIAGNOSIS — H2513 Age-related nuclear cataract, bilateral: Secondary | ICD-10-CM | POA: Diagnosis not present

## 2024-07-28 DIAGNOSIS — H524 Presbyopia: Secondary | ICD-10-CM | POA: Diagnosis not present

## 2024-07-31 ENCOUNTER — Other Ambulatory Visit: Payer: Self-pay | Admitting: Allergy & Immunology

## 2024-08-03 DIAGNOSIS — M25512 Pain in left shoulder: Secondary | ICD-10-CM | POA: Diagnosis not present

## 2024-08-06 DIAGNOSIS — I639 Cerebral infarction, unspecified: Secondary | ICD-10-CM | POA: Diagnosis not present

## 2024-08-06 DIAGNOSIS — I10 Essential (primary) hypertension: Secondary | ICD-10-CM | POA: Diagnosis not present

## 2024-08-09 DIAGNOSIS — E7800 Pure hypercholesterolemia, unspecified: Secondary | ICD-10-CM | POA: Diagnosis not present

## 2024-08-09 DIAGNOSIS — I10 Essential (primary) hypertension: Secondary | ICD-10-CM | POA: Diagnosis not present

## 2024-08-09 DIAGNOSIS — M0579 Rheumatoid arthritis with rheumatoid factor of multiple sites without organ or systems involvement: Secondary | ICD-10-CM | POA: Diagnosis not present

## 2024-08-09 DIAGNOSIS — I639 Cerebral infarction, unspecified: Secondary | ICD-10-CM | POA: Diagnosis not present

## 2024-08-10 DIAGNOSIS — M25512 Pain in left shoulder: Secondary | ICD-10-CM | POA: Diagnosis not present

## 2024-08-12 ENCOUNTER — Encounter: Payer: Self-pay | Admitting: Physical Medicine & Rehabilitation

## 2024-08-12 ENCOUNTER — Encounter: Attending: Physical Medicine & Rehabilitation | Admitting: Physical Medicine & Rehabilitation

## 2024-08-12 VITALS — BP 109/64 | HR 65 | Ht 60.0 in | Wt 134.0 lb

## 2024-08-12 DIAGNOSIS — I69354 Hemiplegia and hemiparesis following cerebral infarction affecting left non-dominant side: Secondary | ICD-10-CM | POA: Insufficient documentation

## 2024-08-12 NOTE — Patient Instructions (Signed)
Achilles Tendinitis Rehab Ask your health care provider which exercises are safe for you. Do exercises exactly as told by your provider and adjust them as directed. It is normal to feel mild stretching, pulling, tightness, or discomfort as you do these exercises. Stop right away if you feel sudden pain or your pain gets worse. Do not begin these exercises until told by your provider. Stretching and range-of-motion exercises These exercises warm up your muscles and joints. They improve the movement and flexibility of your ankle. These exercises also help to relieve pain. Standing wall calf stretch with straight knee  Stand with your hands against a wall. Extend your left / right leg behind you, and bend your front knee slightly. Keep both of your heels on the floor. Point the toes of your back foot slightly inward. Keeping your heels on the floor and your back knee straight, shift your weight toward the wall. Do not let your back arch. You should feel a gentle stretch in your upper calf. Hold this position for __________ seconds. Repeat __________ times. Complete this exercise __________ times a day. Standing wall calf stretch with bent knee  Stand with your hands against a wall. Extend your left / right leg behind you, and bend your front knee slightly. Keep both of your heels on the floor. Point the toes of your back foot slightly inward. Keeping your heels on the floor, bend your back knee slightly. You should feel a gentle stretch deep in your lower calf near your heel. Hold this position for __________ seconds. Repeat __________ times. Complete this exercise __________ times a day. Strengthening exercises These exercises build strength and control of your ankle. Endurance is the ability to use your muscles for a long time, even after they get tired. Plantar flexion with band In this exercise, you push your toes downward, away from you, with an exercise band providing resistance. Sit on  the floor with your left / right leg extended. You may put a pillow under your calf to give your foot more room to move. Loop a rubber exercise band or tube around the ball of your left / right foot. The ball of your foot is on the walking surface, right under your toes. The band or tube should be slightly tense when your foot is relaxed. If the band or tube slips, you can put on your shoe or put a washcloth between the band and your foot to help it stay in place. Slowly point your toes downward, pushing them away from you (plantar flexion). Hold this position for __________ seconds. Slowly release the tension in the band or tube, controlling smoothly until your foot is back to the starting position. Repeat steps 1-5 with your left / right leg. Repeat __________ times. Complete this exercise __________ times a day. Eccentric heel drop  In this exercise, you stand and slowly raise your heel and then slowly lower it. This exercise lengthens the calf muscles (eccentric) while the foot bears weight. If this exercise is too easy, try doing it while wearing a backpack with weights in it. Stand on a step with the balls of your feet. The ball of your foot is on the walking surface, right under your toes. Do not put your heels on the step. For balance, rest your hands on the wall or on a railing. Rise up onto the balls of your feet. Keeping your heels up, shift all of your weight to your left / right leg and pick up your  other leg. Slowly lower your left / right leg so your heel drops below the level of the step. Put down your other foot before going back to the start position. If told by your provider, build up to: 3 sets of 15 repetitions while keeping your knees straight. 3 sets of 15 repetitions while keeping your knees slightly bent as far as told by your provider. Repeat __________ times. Complete this exercise __________ times a day. Balance exercises These exercises improve or maintain your  balance. Balance helps to prevent falls. Single leg stand If this exercise is too easy, you can try it with your eyes closed or while standing on a pillow. Without shoes, stand near a railing or in a doorframe. Hold on to the railing or doorframe as needed. Stand on your left / right foot. Keep your big toe down on the floor and try to keep your arch lifted. You should feel a stretch across the bottom of your foot and arch. Do not let your foot roll inward. Hold this position for __________ seconds. Repeat __________ times. Complete this exercise __________ times a day. This information is not intended to replace advice given to you by your health care provider. Make sure you discuss any questions you have with your health care provider. Document Revised: 08/05/2022 Document Reviewed: 08/05/2022 Elsevier Patient Education  2024 [employer].

## 2024-08-12 NOTE — Progress Notes (Signed)
 Subjective:    Patient ID: Priscilla Wilson, female    DOB: 09/06/1951, 73 y.o.   MRN: [id number]  HPI  73 year old female with history of right thalamic capsular infarct approximately 10 months ago started developing increasing left shoulder pain over the last 1 to 2 months, seen by PCP who referred her to PT.  Pt reports having Left rotator cuff problems in the past but has not had any shoulder surgery or shoulder injections. Left shoulder numbness has subsided  She is functionally independent with all self-care and mobility.  She ambulates without an assistive device Bicycling 2 d per wk at [facility]  ED visit for lower back spasm  She was cleared to drive last visit and she has done so without issues Pain Inventory Average Pain 0 Pain Right Now 0 My pain is No pain  LOCATION OF PAIN  left hip/thigh spasms, left finger tips numbness  BOWEL Number of stools per week: 8 Oral laxative use No    BLADDER Normal    Mobility ability to climb steps?  yes do you drive?  yes Do you have any goals in this area?  yes  Function retired Do you have any goals in this area?  yes  Neuro/Psych weakness numbness tremor  Prior Studies Any changes since last visit?  yes x-rays at [facility] (lower back)  Physicians involved in your care Any changes since last visit?  no   Family History  Problem Relation Age of Onset   Cancer - Ovarian Sister    Social History   Socioeconomic History   Marital status: Married    Spouse name: Not on file   Number of children: Not on file   Years of education: Not on file   Highest education level: Not on file  Occupational History   Not on file  Tobacco Use   Smoking status: Never    Passive exposure: Past   Smokeless tobacco: Never  Vaping Use   Vaping status: Never Used  Substance and Sexual Activity   Alcohol use: Not Currently   Drug use: Never   Sexual activity: Yes  Other Topics Concern   Not on file  Social History  Narrative   Not on file   Social Drivers of Health   Financial Resource Strain: Not on file  Food Insecurity: No Food Insecurity (08/05/2023)   Hunger Vital Sign    Worried About Running Out of Food in the Last Year: Never true    Ran Out of Food in the Last Year: Never true  Transportation Needs: No Transportation Needs (08/05/2023)   PRAPARE - Administrator, Civil Service (Medical): No    Lack of Transportation (Non-Medical): No  Physical Activity: Not on file  Stress: Not on file  Social Connections: Not on file   Past Surgical History:  Procedure Laterality Date   BREAST EXCISIONAL BIOPSY Left    benign   History reviewed. No pertinent past medical history. BP 109/64   Pulse 65   Ht 5' (1.524 m)   Wt 134 lb (60.8 kg)   SpO2 97%   BMI 26.17 kg/m   Opioid Risk Score:   Fall Risk Score:  `1  Depression screen PHQ 2/9     08/12/2024    2:09 PM 05/12/2024    1:36 PM 11/12/2023    1:40 PM 10/01/2023    1:38 PM 09/02/2023    8:45 AM  Depression screen PHQ 2/9  Decreased Interest 0 0 0 0  0  Down, Depressed, Hopeless 0 0 0 0 0  PHQ - 2 Score 0 0 0 0 0  Altered sleeping     0  Tired, decreased energy     0  Change in appetite     0  Feeling bad or failure about yourself      0  Trouble concentrating     0  Moving slowly or fidgety/restless     0  Suicidal thoughts     0  PHQ-9 Score     0    Review of Systems  Neurological:  Positive for tremors, weakness and numbness.  All other systems reviewed and are negative.      Objective:   Physical Exam General No acute distress Mood and affect appropriate Speech without dysarthria or aphasia Motor strength is 5/5 bilateral deltoid bicep tricep grip Left lower limb has 4/5 in the hip flexor knee extensor and ankle dorsiflexor Right lower limb is 5/5 in hip flexor knee extensor ankle dorsiflexor Sensation is intact to light touch bilateral upper and lower limbs No evidence of tremor or ataxia Ambulates  without assistive device she has a stifflegged gait on the left side but otherwise ambulating well.  Mild stiffness left Achilles     Assessment & Plan:  1.  Right thalamic capsular infarct she has some paresthesias over the fingers but no sensory changes She still has some mild weakness and abnormal tone in the left lower limb but remains functionally independent.  I do not think she needs any further physical therapy at this time.  I will see her back on a as needed basis She will follow-up with neurology as well as PCP We went over some ankle stretching exercises

## 2024-09-05 DIAGNOSIS — I639 Cerebral infarction, unspecified: Secondary | ICD-10-CM | POA: Diagnosis not present

## 2024-09-05 DIAGNOSIS — I10 Essential (primary) hypertension: Secondary | ICD-10-CM | POA: Diagnosis not present

## 2024-09-09 DIAGNOSIS — I639 Cerebral infarction, unspecified: Secondary | ICD-10-CM | POA: Diagnosis not present

## 2024-09-09 DIAGNOSIS — E7800 Pure hypercholesterolemia, unspecified: Secondary | ICD-10-CM | POA: Diagnosis not present

## 2024-09-09 DIAGNOSIS — I10 Essential (primary) hypertension: Secondary | ICD-10-CM | POA: Diagnosis not present

## 2024-09-09 DIAGNOSIS — M0579 Rheumatoid arthritis with rheumatoid factor of multiple sites without organ or systems involvement: Secondary | ICD-10-CM | POA: Diagnosis not present

## 2024-09-27 DIAGNOSIS — M1991 Primary osteoarthritis, unspecified site: Secondary | ICD-10-CM | POA: Diagnosis not present

## 2024-09-27 DIAGNOSIS — Z1589 Genetic susceptibility to other disease: Secondary | ICD-10-CM | POA: Diagnosis not present

## 2024-09-27 DIAGNOSIS — Z79899 Other long term (current) drug therapy: Secondary | ICD-10-CM | POA: Diagnosis not present

## 2024-09-27 DIAGNOSIS — M0579 Rheumatoid arthritis with rheumatoid factor of multiple sites without organ or systems involvement: Secondary | ICD-10-CM | POA: Diagnosis not present

## 2024-09-27 DIAGNOSIS — Z6826 Body mass index (BMI) 26.0-26.9, adult: Secondary | ICD-10-CM | POA: Diagnosis not present

## 2024-09-27 DIAGNOSIS — E663 Overweight: Secondary | ICD-10-CM | POA: Diagnosis not present

## 2024-10-05 DIAGNOSIS — I639 Cerebral infarction, unspecified: Secondary | ICD-10-CM | POA: Diagnosis not present

## 2024-10-05 DIAGNOSIS — I10 Essential (primary) hypertension: Secondary | ICD-10-CM | POA: Diagnosis not present

## 2024-10-09 DIAGNOSIS — E7800 Pure hypercholesterolemia, unspecified: Secondary | ICD-10-CM | POA: Diagnosis not present

## 2024-10-09 DIAGNOSIS — I639 Cerebral infarction, unspecified: Secondary | ICD-10-CM | POA: Diagnosis not present

## 2024-10-09 DIAGNOSIS — I10 Essential (primary) hypertension: Secondary | ICD-10-CM | POA: Diagnosis not present

## 2024-10-09 DIAGNOSIS — M0579 Rheumatoid arthritis with rheumatoid factor of multiple sites without organ or systems involvement: Secondary | ICD-10-CM | POA: Diagnosis not present

## 2024-12-15 ENCOUNTER — Telehealth: Payer: Self-pay | Admitting: *Deleted

## 2024-12-15 NOTE — Telephone Encounter (Signed)
 Spoke to patient to confirm upcoming afternoon [facility] clinic appointment on 2/11, paperwork will be sent via [facility] location and time, also informed patient that the surgeon's office would be calling as well to get information from them similar to the packet that they will be receiving so make sure to do both.  Reminded patient that all providers will be coming to the clinic to see them HERE and if they had any questions to not hesitate to reach back out to myself or their navigators.

## 2024-12-21 ENCOUNTER — Ambulatory Visit: Admitting: Radiation Oncology
